# Patient Record
Sex: Male | Born: 1955 | Race: White | Hispanic: No | Marital: Married | Smoking: Current every day smoker
Health system: Southern US, Community
[De-identification: ages and names within clinical notes are randomized; demographics above are authoritative.]

## PROBLEM LIST (undated history)

## (undated) DIAGNOSIS — Z720 Tobacco use: Secondary | ICD-10-CM

## (undated) DIAGNOSIS — I10 Essential (primary) hypertension: Secondary | ICD-10-CM

## (undated) DIAGNOSIS — T24139A Burn of first degree of unspecified lower leg, initial encounter: Secondary | ICD-10-CM

## (undated) DIAGNOSIS — R0600 Dyspnea, unspecified: Secondary | ICD-10-CM

## (undated) DIAGNOSIS — F32A Depression, unspecified: Secondary | ICD-10-CM

## (undated) DIAGNOSIS — F329 Major depressive disorder, single episode, unspecified: Secondary | ICD-10-CM

## (undated) HISTORY — PX: REPLACEMENT TOTAL KNEE: SUR1224

## (undated) HISTORY — PX: BACK SURGERY: SHX140

## (undated) HISTORY — PX: COLON SURGERY: SHX602

## (undated) HISTORY — PX: JOINT REPLACEMENT: SHX530

## (undated) HISTORY — PX: CARPAL TUNNEL RELEASE: SHX101

---
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
## 2019-08-03 ENCOUNTER — Other Ambulatory Visit: Payer: Self-pay | Admitting: *Deleted

## 2019-08-03 ENCOUNTER — Inpatient Hospital Stay (HOSPITAL_COMMUNITY)
Admission: EM | Admit: 2019-08-03 | Discharge: 2019-08-16 | DRG: 231 | Disposition: A | Discharge status: Home Health | Payer: Medicare Other | Source: Other Acute Inpatient Hospital | Attending: Surgery | Admitting: Surgery

## 2019-08-03 ENCOUNTER — Encounter (HOSPITAL_COMMUNITY): Payer: Self-pay | Admitting: Medical

## 2019-08-03 ENCOUNTER — Other Ambulatory Visit: Payer: Self-pay

## 2019-08-03 ENCOUNTER — Ambulatory Visit (HOSPITAL_COMMUNITY)
Admission: AD | Admit: 2019-08-03 | Payer: Self-pay | Source: Other Acute Inpatient Hospital | Admitting: Cardiovascular Disease

## 2019-08-03 ENCOUNTER — Encounter (HOSPITAL_COMMUNITY)
Admission: EM | Disposition: A | Discharge status: Home Health | Payer: Self-pay | Source: Other Acute Inpatient Hospital | Attending: Surgery

## 2019-08-03 ENCOUNTER — Inpatient Hospital Stay (HOSPITAL_COMMUNITY): Payer: Medicare Other

## 2019-08-03 DIAGNOSIS — R001 Bradycardia, unspecified: Secondary | ICD-10-CM | POA: Diagnosis present

## 2019-08-03 DIAGNOSIS — Z20822 Contact with and (suspected) exposure to covid-19: Secondary | ICD-10-CM | POA: Diagnosis present

## 2019-08-03 DIAGNOSIS — J189 Pneumonia, unspecified organism: Secondary | ICD-10-CM | POA: Diagnosis not present

## 2019-08-03 DIAGNOSIS — K5900 Constipation, unspecified: Secondary | ICD-10-CM | POA: Diagnosis present

## 2019-08-03 DIAGNOSIS — F17200 Nicotine dependence, unspecified, uncomplicated: Secondary | ICD-10-CM | POA: Diagnosis present

## 2019-08-03 DIAGNOSIS — I2510 Atherosclerotic heart disease of native coronary artery without angina pectoris: Secondary | ICD-10-CM | POA: Diagnosis not present

## 2019-08-03 DIAGNOSIS — I214 Non-ST elevation (NSTEMI) myocardial infarction: Principal | ICD-10-CM | POA: Diagnosis present

## 2019-08-03 DIAGNOSIS — I480 Paroxysmal atrial fibrillation: Secondary | ICD-10-CM | POA: Diagnosis present

## 2019-08-03 DIAGNOSIS — D62 Acute posthemorrhagic anemia: Secondary | ICD-10-CM | POA: Diagnosis not present

## 2019-08-03 DIAGNOSIS — I110 Hypertensive heart disease with heart failure: Secondary | ICD-10-CM | POA: Diagnosis present

## 2019-08-03 DIAGNOSIS — E785 Hyperlipidemia, unspecified: Secondary | ICD-10-CM | POA: Diagnosis present

## 2019-08-03 DIAGNOSIS — F329 Major depressive disorder, single episode, unspecified: Secondary | ICD-10-CM | POA: Diagnosis present

## 2019-08-03 DIAGNOSIS — I25110 Atherosclerotic heart disease of native coronary artery with unstable angina pectoris: Secondary | ICD-10-CM

## 2019-08-03 DIAGNOSIS — Z01810 Encounter for preprocedural cardiovascular examination: Secondary | ICD-10-CM | POA: Diagnosis not present

## 2019-08-03 DIAGNOSIS — Z8673 Personal history of transient ischemic attack (TIA), and cerebral infarction without residual deficits: Secondary | ICD-10-CM

## 2019-08-03 DIAGNOSIS — Z716 Tobacco abuse counseling: Secondary | ICD-10-CM

## 2019-08-03 DIAGNOSIS — Z9861 Coronary angioplasty status: Secondary | ICD-10-CM | POA: Diagnosis not present

## 2019-08-03 DIAGNOSIS — I509 Heart failure, unspecified: Secondary | ICD-10-CM

## 2019-08-03 DIAGNOSIS — I213 ST elevation (STEMI) myocardial infarction of unspecified site: Secondary | ICD-10-CM

## 2019-08-03 DIAGNOSIS — I083 Combined rheumatic disorders of mitral, aortic and tricuspid valves: Secondary | ICD-10-CM | POA: Diagnosis present

## 2019-08-03 DIAGNOSIS — Z951 Presence of aortocoronary bypass graft: Secondary | ICD-10-CM

## 2019-08-03 DIAGNOSIS — I361 Nonrheumatic tricuspid (valve) insufficiency: Secondary | ICD-10-CM | POA: Diagnosis not present

## 2019-08-03 DIAGNOSIS — E039 Hypothyroidism, unspecified: Secondary | ICD-10-CM | POA: Diagnosis present

## 2019-08-03 DIAGNOSIS — I255 Ischemic cardiomyopathy: Secondary | ICD-10-CM | POA: Diagnosis present

## 2019-08-03 DIAGNOSIS — F17210 Nicotine dependence, cigarettes, uncomplicated: Secondary | ICD-10-CM | POA: Diagnosis present

## 2019-08-03 DIAGNOSIS — R079 Chest pain, unspecified: Secondary | ICD-10-CM

## 2019-08-03 DIAGNOSIS — R0902 Hypoxemia: Secondary | ICD-10-CM | POA: Diagnosis not present

## 2019-08-03 DIAGNOSIS — R0600 Dyspnea, unspecified: Secondary | ICD-10-CM

## 2019-08-03 DIAGNOSIS — E871 Hypo-osmolality and hyponatremia: Secondary | ICD-10-CM | POA: Diagnosis present

## 2019-08-03 DIAGNOSIS — Z01818 Encounter for other preprocedural examination: Secondary | ICD-10-CM

## 2019-08-03 DIAGNOSIS — J440 Chronic obstructive pulmonary disease with acute lower respiratory infection: Secondary | ICD-10-CM | POA: Diagnosis not present

## 2019-08-03 DIAGNOSIS — R7401 Elevation of levels of liver transaminase levels: Secondary | ICD-10-CM | POA: Diagnosis not present

## 2019-08-03 DIAGNOSIS — I340 Nonrheumatic mitral (valve) insufficiency: Secondary | ICD-10-CM

## 2019-08-03 DIAGNOSIS — Z01811 Encounter for preprocedural respiratory examination: Secondary | ICD-10-CM

## 2019-08-03 DIAGNOSIS — I5023 Acute on chronic systolic (congestive) heart failure: Secondary | ICD-10-CM | POA: Diagnosis present

## 2019-08-03 DIAGNOSIS — J9811 Atelectasis: Secondary | ICD-10-CM

## 2019-08-03 DIAGNOSIS — I10 Essential (primary) hypertension: Secondary | ICD-10-CM | POA: Diagnosis present

## 2019-08-03 DIAGNOSIS — E119 Type 2 diabetes mellitus without complications: Secondary | ICD-10-CM | POA: Diagnosis present

## 2019-08-03 HISTORY — PX: LEFT HEART CATH AND CORONARY ANGIOGRAPHY: CATH118249

## 2019-08-03 HISTORY — PX: CORONARY ANGIOPLASTY: SHX604

## 2019-08-03 HISTORY — DX: Ischemic cardiomyopathy: I25.5

## 2019-08-03 HISTORY — DX: Essential (primary) hypertension: I10

## 2019-08-03 HISTORY — DX: Atherosclerotic heart disease of native coronary artery with unstable angina pectoris: I25.110

## 2019-08-03 HISTORY — DX: Depression, unspecified: F32.A

## 2019-08-03 HISTORY — PX: CORONARY BALLOON ANGIOPLASTY: CATH118233

## 2019-08-03 HISTORY — PX: TRANSTHORACIC ECHOCARDIOGRAM: SHX275

## 2019-08-03 HISTORY — DX: Non-ST elevation (NSTEMI) myocardial infarction: I21.4

## 2019-08-03 HISTORY — DX: Tobacco use: Z72.0

## 2019-08-03 LAB — MAGNESIUM: Magnesium: 1.6 mg/dL — ABNORMAL LOW (ref 1.7–2.4)

## 2019-08-03 LAB — ECHOCARDIOGRAM COMPLETE
Height: 67.5 in
Weight: 2912 oz

## 2019-08-03 LAB — HEMOGLOBIN A1C
Hgb A1c MFr Bld: 6 % — ABNORMAL HIGH (ref 4.8–5.6)
Mean Plasma Glucose: 125.5 mg/dL

## 2019-08-03 LAB — TSH: TSH: 13.93 u[IU]/mL — ABNORMAL HIGH (ref 0.350–4.500)

## 2019-08-03 LAB — POCT ACTIVATED CLOTTING TIME: Activated Clotting Time: 489 seconds

## 2019-08-03 LAB — SARS CORONAVIRUS 2 BY RT PCR (HOSPITAL ORDER, PERFORMED IN ~~LOC~~ HOSPITAL LAB): SARS Coronavirus 2: NEGATIVE

## 2019-08-03 LAB — HIV ANTIBODY (ROUTINE TESTING W REFLEX): HIV Screen 4th Generation wRfx: NONREACTIVE

## 2019-08-03 LAB — MRSA PCR SCREENING: MRSA by PCR: NEGATIVE

## 2019-08-03 SURGERY — LEFT HEART CATH AND CORONARY ANGIOGRAPHY
Anesthesia: LOCAL

## 2019-08-03 MED ORDER — SODIUM CHLORIDE 0.9 % IV SOLN: 250.0000 mL | INTRAVENOUS | Status: DC | PRN

## 2019-08-03 MED ORDER — ONDANSETRON HCL 4 MG/2ML IJ SOLN: 4.0000 mg | Freq: Four times a day (QID) | INTRAMUSCULAR | Status: DC | PRN

## 2019-08-03 MED ORDER — HEPARIN (PORCINE) IN NACL 1000-0.9 UT/500ML-% IV SOLN
INTRAVENOUS | Status: DC | PRN
  Administered 2019-08-03 (×2): 500 mL

## 2019-08-03 MED ORDER — ASPIRIN EC 81 MG PO TBEC
81.0000 mg | DELAYED_RELEASE_TABLET | Freq: Every day | ORAL | Status: DC
  Administered 2019-08-04: 81 mg via ORAL
  Filled 2019-08-03: qty 1

## 2019-08-03 MED ORDER — NITROGLYCERIN 1 MG/10 ML FOR IR/CATH LAB
INTRA_ARTERIAL | Status: DC | PRN
  Administered 2019-08-03 (×2): 200 ug via INTRACORONARY

## 2019-08-03 MED ORDER — ZOLPIDEM TARTRATE 5 MG PO TABS: 5.0000 mg | ORAL_TABLET | Freq: Every evening | ORAL | Status: DC | PRN

## 2019-08-03 MED ORDER — HEPARIN (PORCINE) IN NACL 1000-0.9 UT/500ML-% IV SOLN
INTRAVENOUS | Status: AC
  Filled 2019-08-03: qty 1000

## 2019-08-03 MED ORDER — SODIUM CHLORIDE 0.9% FLUSH
3.0000 mL | Freq: Two times a day (BID) | INTRAVENOUS | Status: DC
  Administered 2019-08-03 – 2019-08-04 (×3): 3 mL via INTRAVENOUS

## 2019-08-03 MED ORDER — ATORVASTATIN CALCIUM 80 MG PO TABS
80.0000 mg | ORAL_TABLET | Freq: Every day | ORAL | Status: DC
  Administered 2019-08-03 – 2019-08-15 (×12): 80 mg via ORAL
  Filled 2019-08-03 (×11): qty 1

## 2019-08-03 MED ORDER — ALPRAZOLAM 0.25 MG PO TABS
0.2500 mg | ORAL_TABLET | Freq: Two times a day (BID) | ORAL | Status: DC | PRN
  Administered 2019-08-06: 0.25 mg via ORAL
  Filled 2019-08-03: qty 1

## 2019-08-03 MED ORDER — NITROGLYCERIN 0.4 MG SL SUBL
0.4000 mg | SUBLINGUAL_TABLET | SUBLINGUAL | Status: DC | PRN
  Administered 2019-08-04: 0.4 mg via SUBLINGUAL
  Filled 2019-08-03: qty 1

## 2019-08-03 MED ORDER — HEPARIN SODIUM (PORCINE) 1000 UNIT/ML IJ SOLN
INTRAMUSCULAR | Status: DC | PRN
  Administered 2019-08-03: 5000 [IU] via INTRAVENOUS

## 2019-08-03 MED ORDER — MIDAZOLAM HCL 2 MG/2ML IJ SOLN
INTRAMUSCULAR | Status: AC
  Filled 2019-08-03: qty 2

## 2019-08-03 MED ORDER — METOPROLOL TARTRATE 25 MG PO TABS: 25.0000 mg | ORAL_TABLET | Freq: Two times a day (BID) | ORAL | Status: DC

## 2019-08-03 MED ORDER — ASPIRIN 81 MG PO CHEW: 324.0000 mg | CHEWABLE_TABLET | ORAL | Status: AC

## 2019-08-03 MED ORDER — FENTANYL CITRATE (PF) 100 MCG/2ML IJ SOLN
INTRAMUSCULAR | Status: DC | PRN
  Administered 2019-08-03: 25 ug via INTRAVENOUS

## 2019-08-03 MED ORDER — HEPARIN SODIUM (PORCINE) 1000 UNIT/ML IJ SOLN
INTRAMUSCULAR | Status: DC | PRN
  Administered 2019-08-03: 4000 [IU] via INTRAVENOUS

## 2019-08-03 MED ORDER — SODIUM CHLORIDE 0.9% FLUSH
3.0000 mL | Freq: Two times a day (BID) | INTRAVENOUS | Status: DC
  Administered 2019-08-03 – 2019-08-04 (×2): 3 mL via INTRAVENOUS

## 2019-08-03 MED ORDER — ASPIRIN 300 MG RE SUPP: 300.0000 mg | RECTAL | Status: AC

## 2019-08-03 MED ORDER — NITROGLYCERIN 1 MG/10 ML FOR IR/CATH LAB
INTRA_ARTERIAL | Status: AC
  Filled 2019-08-03: qty 10

## 2019-08-03 MED ORDER — SODIUM CHLORIDE 0.9% FLUSH: 3.0000 mL | INTRAVENOUS | Status: DC | PRN

## 2019-08-03 MED ORDER — LIDOCAINE HCL (PF) 1 % IJ SOLN
INTRAMUSCULAR | Status: DC | PRN
  Administered 2019-08-03: 2 mL via SUBCUTANEOUS

## 2019-08-03 MED ORDER — MIDAZOLAM HCL 2 MG/2ML IJ SOLN
INTRAMUSCULAR | Status: DC | PRN
  Administered 2019-08-03: 1 mg via INTRAVENOUS

## 2019-08-03 MED ORDER — CHLORHEXIDINE GLUCONATE CLOTH 2 % EX PADS
6.0000 | MEDICATED_PAD | Freq: Every day | CUTANEOUS | Status: DC
  Administered 2019-08-03 – 2019-08-08 (×5): 6 via TOPICAL

## 2019-08-03 MED ORDER — VERAPAMIL HCL 2.5 MG/ML IV SOLN
INTRAVENOUS | Status: AC
  Filled 2019-08-03: qty 2

## 2019-08-03 MED ORDER — HEPARIN SODIUM (PORCINE) 1000 UNIT/ML IJ SOLN
INTRAMUSCULAR | Status: AC
  Filled 2019-08-03: qty 1

## 2019-08-03 MED ORDER — IOHEXOL 350 MG/ML SOLN
INTRAVENOUS | Status: DC | PRN
  Administered 2019-08-03: 115 mL via INTRA_ARTERIAL

## 2019-08-03 MED ORDER — ACETAMINOPHEN 325 MG PO TABS
650.0000 mg | ORAL_TABLET | ORAL | Status: DC | PRN
  Administered 2019-08-03 – 2019-08-05 (×2): 650 mg via ORAL
  Filled 2019-08-03: qty 2

## 2019-08-03 MED ORDER — ACETAMINOPHEN 325 MG PO TABS
650.0000 mg | ORAL_TABLET | ORAL | Status: DC | PRN
  Filled 2019-08-03: qty 2

## 2019-08-03 MED ORDER — VERAPAMIL HCL 2.5 MG/ML IV SOLN
INTRAVENOUS | Status: DC | PRN
  Administered 2019-08-03: 10 mL via INTRA_ARTERIAL

## 2019-08-03 MED ORDER — SODIUM CHLORIDE 0.9 % IV SOLN: INTRAVENOUS | Status: AC

## 2019-08-03 MED ORDER — FENTANYL CITRATE (PF) 100 MCG/2ML IJ SOLN
INTRAMUSCULAR | Status: AC
  Filled 2019-08-03: qty 2

## 2019-08-03 MED ORDER — HEPARIN (PORCINE) 25000 UT/250ML-% IV SOLN
1150.0000 [IU]/h | INTRAVENOUS | Status: DC
  Administered 2019-08-03: 1000 [IU]/h via INTRAVENOUS
  Administered 2019-08-04: 1100 [IU]/h via INTRAVENOUS
  Filled 2019-08-03 (×2): qty 250

## 2019-08-03 MED ORDER — LIDOCAINE HCL (PF) 1 % IJ SOLN
INTRAMUSCULAR | Status: AC
  Filled 2019-08-03: qty 30

## 2019-08-03 SURGICAL SUPPLY — 15 items
BALLN SAPPHIRE 2.0X12 (BALLOONS) ×2
BALLOON SAPPHIRE 2.0X12 (BALLOONS) ×1 IMPLANT
CATH INFINITI 5FR JK (CATHETERS) ×2 IMPLANT
CATH LAUNCHER 6FR EBU3.5 (CATHETERS) ×2 IMPLANT
DEVICE RAD COMP TR BAND LRG (VASCULAR PRODUCTS) ×2 IMPLANT
ELECT DEFIB PAD ADLT CADENCE (PAD) ×2 IMPLANT
GLIDESHEATH SLEND SS 6F .021 (SHEATH) ×2 IMPLANT
GUIDEWIRE INQWIRE 1.5J.035X260 (WIRE) ×1 IMPLANT
INQWIRE 1.5J .035X260CM (WIRE) ×2
KIT ENCORE 26 ADVANTAGE (KITS) ×2 IMPLANT
KIT HEART LEFT (KITS) ×2 IMPLANT
PACK CARDIAC CATHETERIZATION (CUSTOM PROCEDURE TRAY) ×2 IMPLANT
TRANSDUCER W/STOPCOCK (MISCELLANEOUS) ×2 IMPLANT
TUBING CIL FLEX 10 FLL-RA (TUBING) ×2 IMPLANT
WIRE RUNTHROUGH .014X180CM (WIRE) ×2 IMPLANT

## 2019-08-03 NOTE — ED Provider Notes (Signed)
[facility] EMERGENCY DEPARTMENT Provider Note   CSN: [id number] Arrival date & time: 08/03/19  1159     History No chief complaint on file.   Rodell Marrs is a 64 y.o. male.  Patient is a 64 year old male with a history of tobacco abuse but no other known medical problems but has not seen a doctor in 3 to 4 years presenting today from [facility] emergency room for a code STEMI.  Patient reports over the last 3 weeks he has had intermittent chest discomfort that he describes as a pressure heaviness and tightness in his left chest that will make him nauseated.  He states it will last sometimes 30 minutes to an hour.  He does not know what brings it on but is not always related to exertion.  He has never had symptoms like this before.  This pain started last night around 8:00 and he reports it has not gone away.  When he arrived at the emergency room at the outside hospital he was given aspirin and was also given morphine and Dilaudid which he states significantly helped his pain.  He reports now the pain is a 1 out of 10 and just a mild dull ache in the left side.  He denies any shortness of breath or nausea.  He denies any new fever, productive cough or abdominal pain.  No lower extremity swelling.  He takes no medications at this time.  Family history significant for his mom with heart disease and heart attack but no other family members.  The history is provided by the patient.       No past medical history on file.  There are no problems to display for this patient.        No family history on file.  Social History   Tobacco Use  . Smoking status: Not on file  Substance Use Topics  . Alcohol use: Not on file  . Drug use: Not on file    Home Medications Prior to Admission medications   Not on File    Allergies    Patient has no allergy information on record.  Review of Systems   Review of Systems  All other systems reviewed and are  negative.   Physical Exam Updated Vital Signs BP (!) 145/86   Pulse (!) 101   Temp 97.8 F (36.6 C) (Oral)   Resp 16   Ht 5' 7.5" (1.715 m)   Wt 82.6 kg   SpO2 96%   BMI 28.08 kg/m   Physical Exam Vitals and nursing note reviewed.  Constitutional:      General: He is not in acute distress.    Appearance: Normal appearance. He is well-developed and normal weight.  HENT:     Head: Normocephalic and atraumatic.  Eyes:     Conjunctiva/sclera: Conjunctivae normal.     Pupils: Pupils are equal, round, and reactive to light.  Cardiovascular:     Rate and Rhythm: Normal rate and regular rhythm.     Heart sounds: No murmur.  Pulmonary:     Effort: Pulmonary effort is normal. No respiratory distress.     Breath sounds: Wheezing present. No rales.     Comments: Prolonged expiratory phase with distal wheezing Abdominal:     General: There is no distension.     Palpations: Abdomen is soft.     Tenderness: There is no abdominal tenderness. There is no guarding or rebound.  Musculoskeletal:  General: No tenderness. Normal range of motion.     Cervical back: Normal range of motion and neck supple.  Skin:    General: Skin is warm and dry.     Findings: No erythema or rash.  Neurological:     General: No focal deficit present.     Mental Status: He is alert and oriented to person, place, and time. Mental status is at baseline.  Psychiatric:        Mood and Affect: Mood normal.        Behavior: Behavior normal.        Thought Content: Thought content normal.     ED Results / Procedures / Treatments   Labs (all labs ordered are listed, but only abnormal results are displayed) Labs Reviewed  SARS CORONAVIRUS 2 BY RT PCR [facility] ORDER, [place] [facility])    EKG EKG Interpretation  Date/Time:  Wednesday Aug 03 2019 12:03:36 EDT Ventricular Rate:  67 PR Interval:    QRS Duration: 84 QT Interval:  446 QTC Calculation: 471 R Axis:   52 Text  Interpretation: Sinus rhythm Nonspecific T abnormalities, lateral leads Confirmed by Blanchie Dessert [phone]) on 08/03/2019 12:15:11 PM   Radiology No results found.  Procedures Procedures (including critical care time)  Medications Ordered in ED Medications - No data to display  ED Course  I have reviewed the triage vital signs and the nursing notes.  Pertinent labs & imaging results that were available during my care of the patient were reviewed by me and considered in my medical decision making (see chart for details).    MDM Rules/Calculators/A&P                      64 year old male presenting today from outside hospital as a code STEMI.  There currently no spots in the Cath Lab and patient was brought to the emergency room.  Patient still having 1 out of 10 chest pain but vital signs are stable.  He does have some distal wheezing on expiration however he reports this is chronic for him as he smokes daily.  He is in no acute distress at this time.  Patient has already received 325 mg of aspirin and heparin bolus.  Cardiology is at bedside.  EKG with subtle ST elevation in inferior lateral leads.  Covid is pending.  Cardiology to admit.  MDM Number of Diagnoses or Management Options   Amount and/or Complexity of Data Reviewed Tests in the medicine section of CPT: ordered and reviewed Decide to obtain previous medical records or to obtain history from someone other than the patient: yes Obtain history from someone other than the patient: yes Review and summarize past medical records: yes Discuss the patient with other providers: yes Independent visualization of images, tracings, or specimens: yes  Risk of Complications, Morbidity, and/or Mortality Presenting problems: high Diagnostic procedures: low Management options: low  Patient Progress Patient progress: stable  Final Clinical Impression(s) / ED Diagnoses Final diagnoses:  ST elevation myocardial infarction  (STEMI), unspecified artery [facility])    Rx / DC Orders ED Discharge Orders    None       Blanchie Dessert, MD 08/03/19 1216

## 2019-08-03 NOTE — Consult Note (Signed)
[address]       [place] [address]             [phone]      Cardiothoracic Surgery Consultation   Reason for Consult: Severe multi-vessel coronary artery disease Referring Physician: Dr. Jerilynn Mages. Matthew [name] is an 64 y.o. male.  HPI:  Patient is a 64 year old history of hypertension, hyperlipidemia, and ongoing heavy tobacco abuse who reports a 3-week history of frequent intermittent substernal chest pain that felt like an elephant sitting on his chest and lasted from 15 to 30 minutes.  These episodes have become more frequent and last night he developed prolonged chest pain that was 10 out of 10 and did not resolve.  He presented to [facility] emergency room and electrocardiogram showed minimal lateral ST elevation.  His troponin was elevated at 5.98.  He had continued chest pain was transferred to [facility] for cardiac catheterization which was performed today.  This showed severe three-vessel coronary disease.  The culprit lesion was an occluded large first marginal branch which was successfully opened with balloon angioplasty.  No stent was placed since the patient had severe three-vessel coronary disease and surgery was felt to be the best treatment.  There is also a 90% ostial to proximal LAD stenosis.  There is a large first diagonal and 80% stenosis.  The right coronary artery was diffusely diseased with segmental 60% mid to distal vessel stenosis.  Left ventricular ejection fraction of 35 to 45% by visual estimate with a moderately elevated LVEDP of 21.  A 2D echocardiogram today showed an ejection fraction of 35 to 40% with global hypokinesis.  The left ventricle is mildly dilated.  There is grade 1 diastolic dysfunction.  RV function is normal.  There was mild mitral regurgitation and mild aortic insufficiency.  The patient is on disability due to multiple orthopedic surgeries in the past.  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  He does report that over the last 3 weeks he has had intermittent chest discomfort associated with nausea, diaphoresis, and generalized weakness and shortness of breath.  Past Medical History:  Diagnosis Date  . Depression   . Hyperlipidemia   . Hypertension   . Tobacco use     Past Surgical History:  Procedure Laterality Date  . BACK SURGERY    . REPLACEMENT TOTAL KNEE      Family History  Problem Relation Age of Onset  . Diabetes Mother   . Cancer Father     Social History:  reports that he has been smoking cigarettes. He has a 46.00 pack-year smoking history. He has never used smokeless tobacco. He reports that he does not use drugs. No history on file for alcohol.  Allergies: No Known Allergies  Medications:  I have reviewed the patient's current medications. Prior to Admission:  No medications prior to admission.   Scheduled: . aspirin  324 mg Oral NOW   Or  . aspirin  300 mg Rectal NOW  . [START ON 08/04/2019] aspirin EC  81 mg Oral Daily  . atorvastatin  80 mg Oral q1800  . Chlorhexidine Gluconate Cloth  6 each Topical Daily  . sodium chloride flush  3 mL Intravenous Q12H  . sodium chloride flush  3 mL Intravenous Q12H   Continuous: . sodium chloride    . sodium chloride    . heparin 1,000 Units/hr (08/03/19 1900)  ZWC:HENIDP chloride, sodium chloride, acetaminophen, acetaminophen, ALPRAZolam, nitroGLYCERIN, ondansetron (ZOFRAN) IV, ondansetron (ZOFRAN) IV, sodium chloride flush, sodium chloride flush, zolpidem  Results for orders placed or performed during the hospital encounter of 08/03/19 (from the past 48 hour(s))  SARS Coronavirus 2 by RT PCR (hospital order, performed in [facility] hospital lab) Nasopharyngeal Nasopharyngeal Swab     Status: None   Collection Time: 08/03/19 12:09 PM   Specimen: Nasopharyngeal Swab  Result Value Ref Range   SARS Coronavirus 2 NEGATIVE NEGATIVE    Comment: (NOTE) SARS-CoV-2 target nucleic acids  are NOT DETECTED. The SARS-CoV-2 RNA is generally detectable in upper and lower respiratory specimens during the acute phase of infection. The lowest concentration of SARS-CoV-2 viral copies this assay can detect is 250 copies / mL. A negative result does not preclude SARS-CoV-2 infection and should not be used as the sole basis for treatment or other patient management decisions.  A negative result may occur with improper specimen collection / handling, submission of specimen other than nasopharyngeal swab, presence of viral mutation(s) within the areas targeted by this assay, and inadequate number of viral copies (<250 copies / mL). A negative result must be combined with clinical observations, patient history, and epidemiological information. Fact Sheet for Patients:   StrictlyIdeas.no Fact Sheet for Healthcare Providers: BankingDealers.co.za This test is not yet approved or cleared  by the [place] FDA and has been authorized for detection and/or diagnosis of SARS-CoV-2 by FDA under an Emergency Use Authorization (EUA).  This EUA will remain in effect (meaning this test can be used) for the duration of the COVID-19 declaration under Section 564(b)(1) of the Act, 21 U.S.C. section 360bbb-3(b)(1), unless the authorization is terminated or revoked sooner. Performed at [facility], [place] [address] [place], [place] [address]   POCT Activated clotting time     Status: None   Collection Time: 08/03/19  1:16 PM  Result Value Ref Range   Activated Clotting Time 489 seconds  MRSA PCR Screening     Status: None   Collection Time: 08/03/19  2:29 PM   Specimen: Nasopharyngeal  Result Value Ref Range   MRSA by PCR NEGATIVE NEGATIVE    Comment:        The GeneXpert MRSA Assay (FDA approved for NASAL specimens only), is one component of a comprehensive MRSA colonization surveillance program. It is not intended to diagnose  MRSA infection nor to guide or monitor treatment for MRSA infections. Performed at [facility], [place] [address], [place], [place] [address]   Hemoglobin A1c     Status: Abnormal   Collection Time: 08/03/19  2:53 PM  Result Value Ref Range   Hgb A1c MFr Bld 6.0 (H) 4.8 - 5.6 %    Comment: (NOTE) Pre diabetes:          5.7%-6.4% Diabetes:              >6.4% Glycemic control for   <7.0% adults with diabetes    Mean Plasma Glucose 125.5 mg/dL    Comment: Performed at [facility] [address] [place], [place] [address]  HIV Antibody (routine testing w rflx)     Status: None   Collection Time: 08/03/19  2:53 PM  Result Value Ref Range   HIV Screen 4th Generation wRfx Non Reactive Non Reactive    Comment: Performed at [facility], [place] [address] [place], [place] [address]  TSH     Status: Abnormal  Collection Time: 08/03/19  2:53 PM  Result Value Ref Range   TSH 13.930 (H) 0.350 - 4.500 uIU/mL    Comment: Performed by a 3rd Generation assay with a functional sensitivity of <=0.01 uIU/mL. Performed at [facility], [place] [address] [place], [place] [address]   Magnesium     Status: Abnormal   Collection Time: 08/03/19  2:53 PM  Result Value Ref Range   Magnesium 1.6 (L) 1.7 - 2.4 mg/dL    Comment: Performed at [facility] [address] [place], [place] [address]    CARDIAC CATHETERIZATION  Result Date: 08/03/2019  Mid RCA to Dist RCA lesion is 60% stenosed.  1st Mrg lesion is 100% stenosed.  Post intervention, there is a 25% residual stenosis.  Balloon angioplasty was performed using a BALLOON SAPPHIRE 2.0X12.  Ost LAD to Prox LAD lesion is 90% stenosed.  1st Diag lesion is 80% stenosed.  There is moderate left ventricular systolic dysfunction.  The left ventricular ejection fraction is 35-45% by visual estimate.  LV end diastolic pressure is moderately elevated.  1.  Significant three-vessel coronary artery disease.  The culprit for  myocardial infarction is an occluded OM1.  There is complex bifurcation proximal LAD/diagonal disease with heavy calcifications and extension all the way back to the ostial LAD.  There is also 60% diffuse disease in the mid to distal right coronary artery. 2.  Moderately reduced LV systolic function with an EF of 35% with global hypokinesis but more pronounced in the inferior apical area.  Moderately elevated left ventricular end-diastolic pressure at 22 mmHg. 3.  Successful balloon angioplasty of OM1 which establish TIMI-3 flow. Recommendations: I elected to treat the culprit OM1 occlusion in order to establish flow.  However, I did not place a stent to avoid the need for dual antiplatelet therapy.  The patient has complex calcified proximal LAD disease at the bifurcation of a large diagonal branch which is also disease.  This is best treated with CABG.  In addition, he has borderline diffuse disease in the mid to distal right coronary artery. Recommend starting heparin drip 4 hours after sheath pull. I consulted CVTS for CABG. the patient was not given a P2 Y 12 inhibitor. An echocardiogram was ordered.   ECHOCARDIOGRAM COMPLETE  Result Date: 08/03/2019    ECHOCARDIOGRAM REPORT   Patient Name:   Matthew [name] Date of Exam: 08/03/2019 Medical Rec #:  [id number]     Height:       67.5 in Accession #:    [id number]    Weight:       182.0 lb Date of Birth:  December 11, 1955     BSA:          1.954 m Patient Age:    70 years      BP:           127/84 mmHg Patient Gender: M             HR:           59 bpm. Exam Location:  Inpatient Procedure: 2D Echo Indications:    chest pain  History:        Patient has no prior history of Echocardiogram examinations.                 CAD; Risk Factors:Current Smoker.  Sonographer:    Johny Chess Referring Phys: [id number] [place]  1. Left ventricular ejection fraction, by estimation, is 35 to 40%. The  left ventricle has moderately decreased function. The left  ventricle demonstrates global hypokinesis. The left ventricular internal cavity size was mildly dilated. Left ventricular diastolic parameters are consistent with Grade I diastolic dysfunction (impaired relaxation). Elevated left atrial pressure.  2. Right ventricular systolic function is normal. The right ventricular size is normal. There is mildly elevated pulmonary artery systolic pressure. The estimated right ventricular systolic pressure is 65.5 mmHg.  3. Left atrial size was mildly dilated.  4. The mitral valve is normal in structure. Mild mitral valve regurgitation. No evidence of mitral stenosis.  5. The aortic valve is normal in structure. Aortic valve regurgitation is mild. No aortic stenosis is present.  6. The inferior vena cava is normal in size with greater than 50% respiratory variability, suggesting right atrial pressure of 3 mmHg. FINDINGS  Left Ventricle: Left ventricular ejection fraction, by estimation, is 35 to 40%. The left ventricle has moderately decreased function. The left ventricle demonstrates global hypokinesis. The left ventricular internal cavity size was mildly dilated. There is no left ventricular hypertrophy. Left ventricular diastolic parameters are consistent with Grade I diastolic dysfunction (impaired relaxation). Elevated left atrial pressure.  LV Wall Scoring: The mid and distal lateral wall and basal anteroseptal segment are hypokinetic. Right Ventricle: The right ventricular size is normal. No increase in right ventricular wall thickness. Right ventricular systolic function is normal. There is mildly elevated pulmonary artery systolic pressure. The tricuspid regurgitant velocity is 2.99  m/s, and with an assumed right atrial pressure of 3 mmHg, the estimated right ventricular systolic pressure is 37.4 mmHg. Left Atrium: Left atrial size was mildly dilated. Right Atrium: Right atrial size was normal in size. Pericardium: There is no evidence of pericardial effusion. Mitral  Valve: The mitral valve is normal in structure. Normal mobility of the mitral valve leaflets. Mild mitral valve regurgitation. No evidence of mitral valve stenosis. Tricuspid Valve: The tricuspid valve is normal in structure. Tricuspid valve regurgitation is mild . No evidence of tricuspid stenosis. Aortic Valve: The aortic valve is normal in structure. Aortic valve regurgitation is mild. Aortic regurgitation PHT measures 560 msec. No aortic stenosis is present. Pulmonic Valve: The pulmonic valve was normal in structure. Pulmonic valve regurgitation is not visualized. No evidence of pulmonic stenosis. Aorta: The aortic root is normal in size and structure. Venous: The inferior vena cava is normal in size with greater than 50% respiratory variability, suggesting right atrial pressure of 3 mmHg. IAS/Shunts: No atrial level shunt detected by color flow Doppler.  LEFT VENTRICLE PLAX 2D LVIDd:         5.60 cm  Diastology LVIDs:         4.40 cm  LV e' lateral:   9.14 cm/s LV PW:         0.80 cm  LV E/e' lateral: 8.8 LV IVS:        0.80 cm  LV e' medial:    6.96 cm/s LVOT diam:     2.00 cm  LV E/e' medial:  11.5 LV SV:         74 LV SV Index:   38 LVOT Area:     3.14 cm  RIGHT VENTRICLE RV S prime:     13.50 cm/s TAPSE (M-mode): 2.0 cm LEFT ATRIUM             Index       RIGHT ATRIUM           Index LA diam:  4.20 cm 2.15 cm/m  RA Area:     16.00 cm LA Vol (A2C):   61.1 ml 31.28 ml/m RA Volume:   41.20 ml  21.09 ml/m LA Vol (A4C):   80.4 ml 41.16 ml/m LA Biplane Vol: 76.0 ml 38.90 ml/m  AORTIC VALVE LVOT Vmax:   101.00 cm/s LVOT Vmean:  63.300 cm/s LVOT VTI:    0.236 m AI PHT:      560 msec  AORTA Ao Root diam: 3.10 cm MITRAL VALVE               TRICUSPID VALVE MV Area (PHT): 2.39 cm    TR Peak grad:   35.8 mmHg MV Decel Time: 317 msec    TR Vmax:        299.00 cm/s MV E velocity: 80.10 cm/s MV A velocity: 81.80 cm/s  SHUNTS MV E/A ratio:  0.98        Systemic VTI:  0.24 m                            Systemic  Diam: 2.00 cm Ena Dawley MD Electronically signed by Ena Dawley MD Signature Date/Time: 08/03/2019/7:03:47 PM    Final     Review of Systems  Constitutional: Positive for activity change, diaphoresis and fatigue.  HENT: Negative.   Eyes: Negative.   Respiratory: Positive for chest tightness and shortness of breath.   Cardiovascular: Positive for chest pain. Negative for palpitations and leg swelling.  Gastrointestinal: Positive for nausea.  Endocrine: Negative.   Genitourinary: Negative.   Musculoskeletal: Positive for back pain and neck pain.  Skin: Negative.   Allergic/Immunologic: Negative.   Neurological: Negative for dizziness.  Hematological: Negative.   Psychiatric/Behavioral: Negative.    Blood pressure 130/69, pulse (!) 58, temperature 97.7 F (36.5 C), temperature source Oral, resp. rate 13, height 5' 7.5" (1.715 m), weight 82.6 kg, SpO2 98 %. Physical Exam  Constitutional: He appears well-developed and well-nourished. No distress.  HENT:  Head: Normocephalic and atraumatic.  Eyes: Pupils are equal, round, and reactive to light. Conjunctivae and EOM are normal.  Neck: No JVD present.  Cardiovascular: Normal rate, regular rhythm and normal heart sounds.  No murmur heard. Respiratory: Effort normal. No respiratory distress. He has wheezes.  GI: Soft. Bowel sounds are normal. He exhibits no distension. There is no abdominal tenderness.  Musculoskeletal:        General: No edema. Normal range of motion.     Cervical back: Normal range of motion and neck supple.     Comments: Varicose veins in both lower extremities.  Scar over right knee from total knee replacement.  Scarring from partial thickness skin graft in the right lower leg.  Lymphadenopathy:    He has no cervical adenopathy.  Skin: Skin is warm and dry.  Psychiatric: He has a normal mood and affect. His behavior is normal.     ECHOCARDIOGRAM REPORT       Patient Name:  Matthew [name] Date of  Exam: 08/03/2019  Medical Rec #: [id number]   Height:    67.5 in  Accession #:  [id number]  Weight:    182.0 lb  Date of Birth: [birth year]   BSA:     1.954 m  Patient Age:  55 years   BP:      127/84 mmHg  Patient Gender: M       HR:      59 bpm.  Exam Location:  Inpatient   Procedure: 2D Echo   Indications:  chest pain    History:    Patient has no prior history of Echocardiogram  examinations.         CAD; Risk Factors:Current Smoker.    Sonographer:  Johny Chess  Referring Phys: [id number] [place]    1. Left ventricular ejection fraction, by estimation, is 35 to 40%. The  left ventricle has moderately decreased function. The left ventricle  demonstrates global hypokinesis. The left ventricular internal cavity size  was mildly dilated. Left ventricular  diastolic parameters are consistent with Grade I diastolic dysfunction  (impaired relaxation). Elevated left atrial pressure.  2. Right ventricular systolic function is normal. The right ventricular  size is normal. There is mildly elevated pulmonary artery systolic  pressure. The estimated right ventricular systolic pressure is 46.5 mmHg.  3. Left atrial size was mildly dilated.  4. The mitral valve is normal in structure. Mild mitral valve  regurgitation. No evidence of mitral stenosis.  5. The aortic valve is normal in structure. Aortic valve regurgitation is  mild. No aortic stenosis is present.  6. The inferior vena cava is normal in size with greater than 50%  respiratory variability, suggesting right atrial pressure of 3 mmHg.   FINDINGS  Left Ventricle: Left ventricular ejection fraction, by estimation, is 35  to 40%. The left ventricle has moderately decreased function. The left  ventricle demonstrates global hypokinesis. The left ventricular internal  cavity size was mildly dilated.  There is no left ventricular  hypertrophy. Left ventricular diastolic  parameters are consistent with Grade I diastolic dysfunction (impaired  relaxation). Elevated left atrial pressure.     LV Wall Scoring:  The mid and distal lateral wall and basal anteroseptal segment are  hypokinetic.   Right Ventricle: The right ventricular size is normal. No increase in  right ventricular wall thickness. Right ventricular systolic function is  normal. There is mildly elevated pulmonary artery systolic pressure. The  tricuspid regurgitant velocity is 2.99  m/s, and with an assumed right atrial pressure of 3 mmHg, the estimated  right ventricular systolic pressure is 68.1 mmHg.   Left Atrium: Left atrial size was mildly dilated.   Right Atrium: Right atrial size was normal in size.   Pericardium: There is no evidence of pericardial effusion.   Mitral Valve: The mitral valve is normal in structure. Normal mobility of  the mitral valve leaflets. Mild mitral valve regurgitation. No evidence of  mitral valve stenosis.   Tricuspid Valve: The tricuspid valve is normal in structure. Tricuspid  valve regurgitation is mild . No evidence of tricuspid stenosis.   Aortic Valve: The aortic valve is normal in structure. Aortic valve  regurgitation is mild. Aortic regurgitation PHT measures 560 msec. No  aortic stenosis is present.   Pulmonic Valve: The pulmonic valve was normal in structure. Pulmonic valve  regurgitation is not visualized. No evidence of pulmonic stenosis.   Aorta: The aortic root is normal in size and structure.   Venous: The inferior vena cava is normal in size with greater than 50%  respiratory variability, suggesting right atrial pressure of 3 mmHg.   IAS/Shunts: No atrial level shunt detected by color flow Doppler.     LEFT VENTRICLE  PLAX 2D  LVIDd:     5.60 cm Diastology  LVIDs:     4.40 cm LV e' lateral:  9.14 cm/s  LV PW:     0.80 cm LV E/e' lateral: 8.8  LV IVS:    0.80 cm  LV e' medial:  6.96 cm/s  LVOT diam:   2.00 cm LV E/e' medial: 11.5  LV SV:     74  LV SV Index:  38  LVOT Area:   3.14 cm     RIGHT VENTRICLE  RV S prime:   13.50 cm/s  TAPSE (M-mode): 2.0 cm   LEFT ATRIUM       Index    RIGHT ATRIUM      Index  LA diam:    4.20 cm 2.15 cm/m RA Area:   16.00 cm  LA Vol (A2C):  61.1 ml 31.28 ml/m RA Volume:  41.20 ml 21.09 ml/m  LA Vol (A4C):  80.4 ml 41.16 ml/m  LA Biplane Vol: 76.0 ml 38.90 ml/m  AORTIC VALVE  LVOT Vmax:  101.00 cm/s  LVOT Vmean: 63.300 cm/s  LVOT VTI:  0.236 m  AI PHT:   560 msec    AORTA  Ao Root diam: 3.10 cm   MITRAL VALVE        TRICUSPID VALVE  MV Area (PHT): 2.39 cm  TR Peak grad:  35.8 mmHg  MV Decel Time: 317 msec  TR Vmax:    299.00 cm/s  MV E velocity: 80.10 cm/s  MV A velocity: 81.80 cm/s SHUNTS  MV E/A ratio: 0.98    Systemic VTI: 0.24 m               Systemic Diam: 2.00 cm   Ena Dawley MD  Electronically signed by Ena Dawley MD  Signature Date/Time: 08/03/2019/7:03:47 PM     Physicians Panel Physicians Referring Physician Case Authorizing Physician  Wellington Hampshire, MD (Primary)       Procedures CORONARY BALLOON ANGIOPLASTY  LEFT HEART CATH AND CORONARY ANGIOGRAPHY     Conclusion Mid RCA to Dist RCA lesion is 60% stenosed.  1st Mrg lesion is 100% stenosed.  Post intervention, there is a 25% residual stenosis.  Balloon angioplasty was performed using a BALLOON SAPPHIRE 2.0X12.  Ost LAD to Prox LAD lesion is 90% stenosed.  1st Diag lesion is 80% stenosed.  There is moderate left ventricular systolic dysfunction.  The left ventricular ejection fraction is 35-45% by visual estimate.  LV end diastolic pressure is moderately elevated. 1. Significant three-vessel coronary artery disease. The culprit for myocardial infarction is an occluded OM1. There is complex bifurcation proximal LAD/diagonal  disease with heavy calcifications and extension all the way back to the ostial LAD. There is also 60% diffuse disease in the mid to distal right coronary artery.  2. Moderately reduced LV systolic function with an EF of 35% with global hypokinesis but more pronounced in the inferior apical area. Moderately elevated left ventricular end-diastolic pressure at 22 mmHg.  3. Successful balloon angioplasty of OM1 which establish TIMI-3 flow.  Recommendations:  I elected to treat the culprit OM1 occlusion in order to establish flow. However, I did not place a stent to avoid the need for dual antiplatelet therapy. The patient has complex calcified proximal LAD disease at the bifurcation of a large diagonal branch which is also disease. This is best treated with CABG. In addition, he has borderline diffuse disease in the mid to distal right coronary artery.  Recommend starting heparin drip 4 hours after sheath pull.  I consulted CVTS for CABG. the patient was not given a P2 Y 12 inhibitor.  An echocardiogram was ordered.            Indications  Non-ST elevation (NSTEMI) myocardial infarction ([place]) [I21.4 (ICD-10-CM)]     Procedural Details Technical Details Procedural Details: The right wrist was prepped, draped, and anesthetized with 1% lidocaine. Using the modified Seldinger technique, a 5 French Slender sheath was introduced into the right radial artery.3 mg of verapamil was administered through the sheath, weight-based unfractionated heparin was administered intravenously. A Jackie catheter was used for selective coronary angiography and left ventriculography. There were no immediate procedural complications.  PCI Note: Following the diagnostic procedure, the decision was made to proceed with PCI. Additional unfractionated heparin was given. See PCI details. I elected to perform balloon angioplasty in the occluded OM1 to establish TIMI-3 flow with no intention of placing a stent to avoid the need for  dual antiplatelet therapy given that the patient will require surgical revascularization of his complex LAD diagonal disease.The patient tolerated the procedure well. There were no immediate procedural complications. A TR band was used for radial hemostasis. The patient was transferred to the post catheterization recovery area for further monitoring.  Estimated blood loss <50 mL.   During this procedure medications were administered to achieve and maintain moderate conscious sedation while the patient's heart rate, blood pressure, and oxygen saturation were continuously monitored and I was present face-to-face 100% of this time.     Medications (Filter: Administrations occurring from 08/03/19 1233 to 08/03/19 1345)  Continuous medications are totaled by the amount administered until 08/03/19 1345.  lidocaine (PF) (XYLOCAINE) 1 % injection (mL) Total volume: 2 mL  Date/Time   Rate/Dose/Volume Action  08/03/19 1253  2 mL Given  Radial Cocktail/Verapamil only (mL) Total volume: 10 mL  Date/Time   Rate/Dose/Volume Action  08/03/19 1254  10 mL Given  midazolam (VERSED) injection (mg) Total dose: 1 mg  Date/Time   Rate/Dose/Volume Action  08/03/19 1251  1 mg Given  fentaNYL (SUBLIMAZE) injection (mcg) Total dose: 25 mcg  Date/Time   Rate/Dose/Volume Action  08/03/19 1251  25 mcg Given  heparin sodium (porcine) injection (Units) Total dose: 5,000 Units  Date/Time   Rate/Dose/Volume Action  08/03/19 1307  5,000 Units Given  nitroGLYCERIN 1 mg/10 mL (100 mcg/mL) - IR/CATH LAB (mcg) Total dose: 400 mcg  Date/Time   Rate/Dose/Volume Action  08/03/19 1316  200 mcg Given  1320  200 mcg Given  Heparin (Porcine) in NaCl 1000-0.9 UT/500ML-% SOLN (mL) Total volume: 1,000 mL  Date/Time   Rate/Dose/Volume Action  08/03/19 1317  500 mL Given  1317  500 mL Given  iohexol (OMNIPAQUE) 350 MG/ML injection (mL) Total volume: 115 mL  Date/Time   Rate/Dose/Volume Action  08/03/19 1334  115 mL  Given  heparin sodium (porcine) injection (Units) Total dose: 4,000 Units  Date/Time   Rate/Dose/Volume Action  08/03/19 1256  4,000 Units Given     Sedation Time Sedation Time Physician-1: 36 minutes 52 seconds        Contrast Medication Name Total Dose  iohexol (OMNIPAQUE) 350 MG/ML injection 115 mL     Radiation/Fluoro Fluoro time: 6.3 (min)  DAP: 63845 (mGycm2)  Cumulative Air Kerma: 364 (mGy)        Coronary Findings Diagnostic Dominance: Right  Left Main  Vessel is angiographically normal.   Left Anterior Descending  Ost LAD to Prox LAD lesion 90% stenosed  Ost LAD to Prox LAD lesion is 90% stenosed. Vessel is not the culprit lesion. The lesion is type C. The lesion is severely calcified.   First Diagonal Branch  Vessel is large in size.  1st Diag  lesion 80% stenosed  1st Diag lesion is 80% stenosed.   Third Diagonal Branch  Vessel is small in size.   Left Circumflex  The vessel exhibits minimal luminal irregularities.   First Obtuse Marginal Branch  Vessel is large in size.  1st Mrg lesion 100% stenosed  1st Mrg lesion is 100% stenosed. Vessel is the culprit lesion. The lesion is not complex (non high-C). The lesion was not previously treated.   Second Obtuse Marginal Branch  Vessel is small in size.   Right Coronary Artery  Mid RCA to Dist RCA lesion 60% stenosed  Mid RCA to Dist RCA lesion is 60% stenosed.   Right Posterior Descending Artery  There is mild disease in the vessel.  Intervention 1st Mrg lesion  Angioplasty  Lesion length: 15 mm. CATH LAUNCHER 6FR EBU3.5 guide catheter was inserted. WIRE RUNTHROUGH .[id number] guidewire used to cross lesion. Balloon angioplasty was performed using a BALLOON SAPPHIRE 2.0X12. Maximum pressure: 10 atm. Inflation time: 90 sec.  Post-Intervention Lesion Assessment  The intervention was successful. Pre-interventional TIMI flow is 0. Post-intervention TIMI flow is 3. No complications occurred at this  lesion.  There is a 25% residual stenosis post intervention.                       Wall Motion Resting               All segments of the heart are hypokinetic.               Left Heart Left Ventricle The left ventricle is mildly dilated. There is moderate left ventricular systolic dysfunction. LV end diastolic pressure is moderately elevated. The left ventricular ejection fraction is 35-45% by visual estimate. There are LV function abnormalities due to segmental dysfunction.     Coronary Diagrams Diagnostic Dominance: Right  &&&&&  Intervention &&&&&       Implants  No implant documentation for this case.      Syngo Images Link to Procedure Log  Show images for CARDIAC CATHETERIZATION Procedure Log     Images on Long Term Storage   Show images for [place], [place]         [facility] Data   Most Recent Value  AO Systolic Pressure 350 mmHg  AO Diastolic Pressure 70 mmHg  AO Mean 94 mmHg  LV Systolic Pressure 093 mmHg  LV Diastolic Pressure 14 mmHg  LV EDP 22 mmHg  AOp Systolic Pressure 818 mmHg  AOp Diastolic Pressure 71 mmHg  AOp Mean Pressure 98 mmHg  LVp Systolic Pressure 299 mmHg  LVp Diastolic Pressure 16 mmHg  LVp EDP Pressure 21 mmHg    Assessment/Plan:  This 64 year old gentleman has severe three-vessel coronary disease and moderate left ventricular systolic ith PTCA although it was likely closed for an extended period since his continuous pain started last night and his catheterization was done earlier this afternoon.  I agree that coronary bypass graft surgery is the best long-term treatment for this patient with severe calcific three-vessel coronary disease with high-grade proximal LAD and diagonal stenosis. I discussed the operative procedure with the patient including alternatives, benefits and risks; including but not limited to bleeding, blood transfusion, infection, stroke, myocardial infarction, graft failure, heart block requiring a  permanent pacemaker, organ dysfunction, and death.  Tsosie Billing understands and agrees to proceed.  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  I would be hesitant to use bilateral  internal mammary artery grafts in this ongoing heavy smoker unless absolutely necessary.  His preoperative vascular exam is pending.  I will tentatively plan to proceed with surgery on Friday morning.  I spent 40 minutes performing this consultation and > 50% of this time was spent face to face counseling and coordinating the care of this patient's severe three-vessel coronary disease.  Gaye Pollack 08/03/2019

## 2019-08-03 NOTE — Progress Notes (Signed)
  Echocardiogram 2D Echocardiogram has been performed.  Delcie Roch 08/03/2019, 4:31 PM

## 2019-08-03 NOTE — H&P (Addendum)
Cardiology Admission History and Physical:   [name] ID: Matthew [name] MRN: [id number]; DOB: [birth year]   Admission date: 08/03/2019  Primary Care Provider: Patient, No Pcp Per Primary Cardiologist: New Primary Electrophysiologist:  None   Chief Complaint: Chest pain  [name] Profile:   Matthew [name] is a 64 y.o. male with pmh of HTN, HLD and heavy tobacco use who presents from [facility] for chest pain.   History of Present Illness:   Matthew [name] has no previous history of MI, stent, stroke, DM, cancer. He has history of hypertension and hyperlipidemia but has not been on medications in 4-5 years. Matthew last time he saw his PCP was about 4 years ago. Family history negative for cardiac history. He is a heavy smoker, 1ppd since he was 60. He is on disability for multiple orthopedic issues.   He presented to Matthew ED at [facility] for chest pain. Matthew symptoms started 3 weeks ago and have been intermittent since then. Exertion did not seem to make Matthew pain worse. Pain is substernal and pressure-like, "like an elephant on my chest". Sometimes radiating down Matthew arms. Had associated sob, nausea when Matthew pain was severe, and diaphoresis. Last night Matthew [name] had another episode that did not go away, pain was severe as 10/10 at that time. This morning he woke up with Matthew same and came into Matthew ED. No recent fever, chills, illness, lower leg edema, orthopnea.   In Matthew ED at [facility] BP 167/87, RR 20, pulse 68, 99%O2. Labs showed creatinine 0.90, AST 142, ALT 30, Alk phos 108. Pro BNP 634, WBC 9.3, Hgb 15.8. troponin 5.98. CXR was unremarkable. In Matthew ED he was given ASA and SL nitro which seemed to improve Matthew chest pain. Also received zofran, morphine 34m IV, metoprolol 2.554mIV, pepcid 2028mV, dilaudid 1mg67m. EKG showed. EKG showed sinus 64bpm with minimal STE in lateral leads with TWI. [name] was seen by Dr. RevaGeraldo Pitter [facility]. Question whether  this was a STEMI but EKG was reviewed by Dr. AridFletcher Anon decided this was NSTEMI.   Past Medical History:  Diagnosis Date  . Depression   . Hyperlipidemia   . Hypertension   . Tobacco use     Past Surgical History:  Procedure Laterality Date  . BACK SURGERY    . REPLACEMENT TOTAL KNEE       Medications Prior to Admission: Prior to Admission medications   Not on File     Allergies:   Not on File  Social History:   Social History   Socioeconomic History  . Marital status: Married    Spouse name: Not on file  . Number of children: Not on file  . Years of education: Not on file  . Highest education level: Not on file  Occupational History  . Occupation: unemployed  Tobacco Use  . Smoking status: Current Every Day Smoker    Packs/day: 1.00    Years: 46.00    Pack years: 46.00    Types: Cigarettes  . Smokeless tobacco: Never Used  Substance and Sexual Activity  . Alcohol use: Not on file  . Drug use: Never  . Sexual activity: Not on file  Other Topics Concern  . Not on file  Social History Narrative  . Not on file   Social Determinants of Health   Financial Resource Strain:   . Difficulty of Paying Living Expenses:   Food Insecurity:   . Worried About Running  Out of Food in Matthew Last Year:   . Pleas[place] in Matthew Last Year:   Transportation Needs:   . Lack of Transportation (Medical):   Marland Kitchen Lack of Transportation (Non-Medical):   Physical Activity:   . Days of Exercise per Week:   . Minutes of Exercise per Session:   Stress:   . Feeling of Stress :   Social Connections:   . Frequency of Communication with Friends and Family:   . Frequency of Social Gatherings with Friends and Family:   . Attends Religious Services:   . Active Member of Clubs or Organizations:   . Attends Archivist Meetings:   Marland Kitchen Marital Status:   Intimate Partner Violence:   . Fear of Current or Ex-Partner:   . Emotionally Abused:   Marland Kitchen Physically Abused:   . Sexually  Abused:     Family History:   Matthew [name]'s family history includes Cancer in his father; Diabetes in his mother.    ROS:  Please see Matthew history of present illness.  All other ROS reviewed and negative.     Physical Exam/Data:   Vitals:   08/03/19 1204 08/03/19 1207 08/03/19 1215  BP: (!) 145/86  (!) 145/83  Pulse: (!) 101  (!) 52  Resp: 16  11  Temp: 97.8 F (36.6 C)    TempSrc: Oral    SpO2: 94% 96% 98%  Weight:  82.6 kg   Height:  5' 7.5" (1.715 m)    No intake or output data in Matthew 24 hours ending 08/03/19 1307 Last 3 Weights 08/03/2019  Weight (lbs) 182 lb  Weight (kg) 82.555 kg     Body mass index is 28.08 kg/m.  General:  Well nourished, well developed, in no acute distress HEENT: normal Lymph: no adenopathy Neck: no JVD Endocrine:  No thryomegaly Vascular: No carotid bruits; FA pulses 2+ bilaterally without bruits  Cardiac:  normal S1, S2; RRR; no murmur  Lungs:  +wheezing, no rhonchi or rales  Abd: soft, nontender, no hepatomegaly  Ext: no edema Musculoskeletal:  No deformities, BUE and BLE strength normal and equal Skin: warm and dry  Neuro:  CNs 2-12 intact, no focal abnormalities noted Psych:  Normal affect    EKG:  Matthew ECG that was done 08/03/19 was personally reviewed and demonstrates sinus with minimal STE lateral leads, TWI  Relevant CV Studies:  Cardiac cath  Laboratory Data:  High Sensitivity Troponin:  No results for input(s): TROPONINIHS in Matthew last 720 hours.    ChemistryNo results for input(s): NA, K, CL, CO2, GLUCOSE, BUN, CREATININE, CALCIUM, GFRNONAA, GFRAA, ANIONGAP in Matthew last 168 hours.  No results for input(s): PROT, ALBUMIN, AST, ALT, ALKPHOS, BILITOT in Matthew last 168 hours. HematologyNo results for input(s): WBC, RBC, HGB, HCT, MCV, MCH, MCHC, RDW, PLT in Matthew last 168 hours. BNPNo results for input(s): BNP, PROBNP in Matthew last 168 hours.  DDimer No results for input(s): DDIMER in Matthew last 168 hours.   Radiology/Studies:  No  results found.  TIMI Risk Score for Unstable Angina or Non-ST Elevation MI:   Matthew [name]'s TIMI risk score is 4, which indicates a 20% risk of all cause mortality, new or recurrent myocardial infarction or need for urgent revascularization in Matthew next 14 days.   Assessment and Plan:   NSTEMI Presents with 3 weeks of chest pain, severe substernal pressure with associated sob, nausea, and diaphoresis - EKG showed minimal STE in lateral leads with TWI - Troponin 5.98 -  He was given aspirin in Matthew ED at [facility] - check lipid panel and A1C - start BB and high intensity statin - [name] is 1/10 chest pain in Matthew [facility] ED - continue to trend troponin - check echo - NPO for cath Risks and benefits of cardiac catheterization have been discussed with Matthew [name].  These include bleeding, infection, kidney damage, stroke, heart attack, death.  Matthew [name] understands these risks and is willing to proceed. - further recs per cath  HTN  - not on meds at baseline - start BB  HLD - start atorvastatin 80 mg - check lipids  Tobacco use - recommend cessation - has diffuse wheezing on exam  Elevated AST - Ast 142, ALT 30, alk phos 108 - repeat labs - might need US liver - denies abd pain  Severity of Illness: Matthew appropriate [name] status for this [name] is INPATIENT. Inpatient status is judged to be reasonable and necessary in order to provide Matthew required intensity of service to ensure Matthew [name]'s safety. Matthew [name]'s presenting symptoms, physical exam findings, and initial radiographic and laboratory data in Matthew context of their chronic comorbidities is felt to place them at high risk for further clinical deterioration. Furthermore, it is not anticipated that Matthew [name] will be medically stable for discharge from Matthew hospital within 2 midnights of admission. Matthew following factors support Matthew [name] status of inpatient.   " Matthew [name]'s presenting symptoms include chest pain. " Matthew  worrisome physical exam findings include chest pain. " Matthew initial radiographic and laboratory data are worrisome because of elevated troponin. " Matthew chronic co-morbidities include HTN and HLD.   * I certify that at Matthew point of admission it is my clinical judgment that Matthew [name] will require inpatient hospital care spanning beyond 2 midnights from Matthew point of admission due to high intensity of service, high risk for further deterioration and high frequency of surveillance required.*    For questions or updates, please contact [place] Please consult [email or web address] for contact info under        Signed, Jasyn Mey Ninfa Meeker, PA-C  08/03/2019 1:07 PM

## 2019-08-03 NOTE — ED Triage Notes (Signed)
Pt arrives via EMS from [facility] for cath lab. CP since last night but no pain at this time.  1 nitro, 4 mg morphine, 4 mg zofran, 5 mg metropolol, 1 mg dilaudid and 4000 unit heparin bolus given at [place].

## 2019-08-03 NOTE — ED Notes (Signed)
To cath lab NOW 

## 2019-08-03 NOTE — Progress Notes (Signed)
ANTICOAGULATION CONSULT NOTE - Initial Consult  Pharmacy Consult for Heparin  Indication: chest pain/ACS  Not on File  Patient Measurements: Height: 5' 7.5" (171.5 cm) Weight: 82.6 kg (182 lb) IBW/kg (Calculated) : 67.25   Vital Signs: Temp: 97.8 F (36.6 C) (05/12 1204) Temp Source: Oral (05/12 1204) BP: 118/78 (05/12 1400) Pulse Rate: 58 (05/12 1400)  Labs: No results for input(s): HGB, HCT, PLT, APTT, LABPROT, INR, HEPARINUNFRC, HEPRLOWMOCWT, CREATININE, CKTOTAL, CKMB, TROPONINIHS in the last 72 hours.  CrCl cannot be calculated (No successful lab value found.).   Medical History: Past Medical History:  Diagnosis Date  . Depression   . Hyperlipidemia   . Hypertension   . Tobacco use       Assessment: 63yom admitted for STEMI, POBA to OM with residual LAD disease and TCTS consult.  Begin heparin drip 4hr after (radial) sheath removed - out 1330 begin heparin at 1800  Heparin drip rate 1000 uts/hr    Goal of Therapy:  Heparin level 0.3-0.7 units/ml Monitor platelets by anticoagulation protocol: Yes   Plan:  Begin heparin drip 1000 uts/hr  Draw HL 6hr after start Daily HL, CBC Monitor s/s bleeding    Leota Sauers Pharm.D. CPP, BCPS Clinical Pharmacist [phone] 08/03/2019 2:33 PM

## 2019-08-04 ENCOUNTER — Inpatient Hospital Stay (HOSPITAL_COMMUNITY): Payer: Medicare Other

## 2019-08-04 ENCOUNTER — Encounter (HOSPITAL_COMMUNITY): Payer: Self-pay | Admitting: Cardiovascular Disease

## 2019-08-04 DIAGNOSIS — Z01810 Encounter for preprocedural cardiovascular examination: Secondary | ICD-10-CM

## 2019-08-04 DIAGNOSIS — I255 Ischemic cardiomyopathy: Secondary | ICD-10-CM

## 2019-08-04 DIAGNOSIS — Z9861 Coronary angioplasty status: Secondary | ICD-10-CM

## 2019-08-04 DIAGNOSIS — I2510 Atherosclerotic heart disease of native coronary artery without angina pectoris: Secondary | ICD-10-CM

## 2019-08-04 DIAGNOSIS — I25110 Atherosclerotic heart disease of native coronary artery with unstable angina pectoris: Secondary | ICD-10-CM

## 2019-08-04 DIAGNOSIS — F17200 Nicotine dependence, unspecified, uncomplicated: Secondary | ICD-10-CM

## 2019-08-04 DIAGNOSIS — I10 Essential (primary) hypertension: Secondary | ICD-10-CM | POA: Diagnosis present

## 2019-08-04 DIAGNOSIS — E785 Hyperlipidemia, unspecified: Secondary | ICD-10-CM

## 2019-08-04 DIAGNOSIS — Z8673 Personal history of transient ischemic attack (TIA), and cerebral infarction without residual deficits: Secondary | ICD-10-CM

## 2019-08-04 HISTORY — DX: Nicotine dependence, unspecified, uncomplicated: F17.200

## 2019-08-04 HISTORY — DX: Essential (primary) hypertension: I10

## 2019-08-04 HISTORY — DX: Personal history of transient ischemic attack (TIA), and cerebral infarction without residual deficits: Z86.73

## 2019-08-04 HISTORY — DX: Hyperlipidemia, unspecified: E78.5

## 2019-08-04 LAB — BASIC METABOLIC PANEL
Anion gap: 9 (ref 5–15)
BUN: 11 mg/dL (ref 8–23)
CO2: 25 mmol/L (ref 22–32)
Calcium: 9 mg/dL (ref 8.9–10.3)
Chloride: 98 mmol/L (ref 98–111)
Creatinine, Ser: 1.07 mg/dL (ref 0.61–1.24)
GFR calc Af Amer: >60 mL/min (ref >60)
GFR calc non Af Amer: >60 mL/min (ref >60)
Glucose, Bld: 124 mg/dL — ABNORMAL HIGH (ref 70–99)
Potassium: 4.5 mmol/L (ref 3.5–5.1)
Sodium: 132 mmol/L — ABNORMAL LOW (ref 135–145)

## 2019-08-04 LAB — POCT I-STAT 7, (LYTES, BLD GAS, ICA,H+H)
Acid-Base Excess: 2 mmol/L (ref 0.0–2.0)
Bicarbonate: 26.4 mmol/L (ref 20.0–28.0)
Calcium, Ion: 1.27 mmol/L (ref 1.15–1.40)
HCT: 39 % (ref 39.0–52.0)
Hemoglobin: 13.3 g/dL (ref 13.0–17.0)
O2 Saturation: 93 %
Potassium: 3.9 mmol/L (ref 3.5–5.1)
Sodium: 133 mmol/L — ABNORMAL LOW (ref 135–145)
TCO2: 28 mmol/L (ref 22–32)
pCO2 arterial: 39.5 mmHg (ref 32.0–48.0)
pH, Arterial: 7.433 (ref 7.350–7.450)
pO2, Arterial: 65 mmHg — ABNORMAL LOW (ref 83.0–108.0)

## 2019-08-04 LAB — HEMOGLOBIN A1C
Hgb A1c MFr Bld: 6 % — ABNORMAL HIGH (ref 4.8–5.6)
Mean Plasma Glucose: 125.5 mg/dL

## 2019-08-04 LAB — COMPREHENSIVE METABOLIC PANEL
ALT: 54 U/L — ABNORMAL HIGH (ref 0–44)
AST: 173 U/L — ABNORMAL HIGH (ref 15–41)
Albumin: 3 g/dL — ABNORMAL LOW (ref 3.5–5.0)
Alkaline Phosphatase: 78 U/L (ref 38–126)
Anion gap: 12 (ref 5–15)
BUN: 11 mg/dL (ref 8–23)
CO2: 24 mmol/L (ref 22–32)
Calcium: 8.9 mg/dL (ref 8.9–10.3)
Chloride: 97 mmol/L — ABNORMAL LOW (ref 98–111)
Creatinine, Ser: 1.07 mg/dL (ref 0.61–1.24)
GFR calc Af Amer: >60 mL/min (ref >60)
GFR calc non Af Amer: >60 mL/min (ref >60)
Glucose, Bld: 138 mg/dL — ABNORMAL HIGH (ref 70–99)
Potassium: 3.7 mmol/L (ref 3.5–5.1)
Sodium: 133 mmol/L — ABNORMAL LOW (ref 135–145)
Total Bilirubin: 0.6 mg/dL (ref 0.3–1.2)
Total Protein: 6 g/dL — ABNORMAL LOW (ref 6.5–8.1)

## 2019-08-04 LAB — CBC
HCT: 41.9 % (ref 39.0–52.0)
Hemoglobin: 14.1 g/dL (ref 13.0–17.0)
MCH: 30.6 pg (ref 26.0–34.0)
MCHC: 33.7 g/dL (ref 30.0–36.0)
MCV: 90.9 fL (ref 80.0–100.0)
Platelets: 219 10*3/uL (ref 150–400)
RBC: 4.61 MIL/uL (ref 4.22–5.81)
RDW: 12.8 % (ref 11.5–15.5)
WBC: 9.7 10*3/uL (ref 4.0–10.5)
nRBC: 0 % (ref 0.0–0.2)

## 2019-08-04 LAB — LIPID PANEL
Cholesterol: 248 mg/dL — ABNORMAL HIGH (ref 0–200)
HDL: 62 mg/dL (ref >40)
LDL Cholesterol: 160 mg/dL — ABNORMAL HIGH (ref 0–99)
Total CHOL/HDL Ratio: 4 RATIO
Triglycerides: 128 mg/dL (ref <150)
VLDL: 26 mg/dL (ref 0–40)

## 2019-08-04 LAB — URINALYSIS, ROUTINE W REFLEX MICROSCOPIC
Bacteria, UA: NONE SEEN
Bilirubin Urine: NEGATIVE
Glucose, UA: NEGATIVE mg/dL
Ketones, ur: NEGATIVE mg/dL
Leukocytes,Ua: NEGATIVE
Nitrite: NEGATIVE
Protein, ur: NEGATIVE mg/dL
Specific Gravity, Urine: 1.011 (ref 1.005–1.030)
pH: 7 (ref 5.0–8.0)

## 2019-08-04 LAB — PROTIME-INR
INR: 1 (ref 0.8–1.2)
Prothrombin Time: 13.1 seconds (ref 11.4–15.2)

## 2019-08-04 LAB — HEPATIC FUNCTION PANEL
ALT: 59 U/L — ABNORMAL HIGH (ref 0–44)
AST: 219 U/L — ABNORMAL HIGH (ref 15–41)
Albumin: 3.2 g/dL — ABNORMAL LOW (ref 3.5–5.0)
Alkaline Phosphatase: 81 U/L (ref 38–126)
Bilirubin, Direct: <0.1 mg/dL (ref 0.0–0.2)
Total Bilirubin: 0.5 mg/dL (ref 0.3–1.2)
Total Protein: 6.1 g/dL — ABNORMAL LOW (ref 6.5–8.1)

## 2019-08-04 LAB — HEPARIN LEVEL (UNFRACTIONATED)
Heparin Unfractionated: 0.27 IU/mL — ABNORMAL LOW (ref 0.30–0.70)
Heparin Unfractionated: 0.28 IU/mL — ABNORMAL LOW (ref 0.30–0.70)

## 2019-08-04 LAB — TYPE AND SCREEN
ABO/RH(D): A POS
Antibody Screen: NEGATIVE

## 2019-08-04 LAB — ABO/RH: ABO/RH(D): A POS

## 2019-08-04 LAB — APTT: aPTT: 80 seconds — ABNORMAL HIGH (ref 24–36)

## 2019-08-04 MED ORDER — MAGNESIUM SULFATE 50 % IJ SOLN
40.0000 meq | INTRAMUSCULAR | Status: DC
  Filled 2019-08-04: qty 9.85

## 2019-08-04 MED ORDER — DIAZEPAM 5 MG PO TABS
5.0000 mg | ORAL_TABLET | Freq: Once | ORAL | Status: AC
  Administered 2019-08-05: 5 mg via ORAL
  Filled 2019-08-04: qty 1

## 2019-08-04 MED ORDER — DEXMEDETOMIDINE HCL IN NACL 400 MCG/100ML IV SOLN
0.1000 ug/kg/h | INTRAVENOUS | Status: AC
  Administered 2019-08-05: .2 ug/kg/h via INTRAVENOUS
  Filled 2019-08-04: qty 100

## 2019-08-04 MED ORDER — SODIUM CHLORIDE 0.9 % IV SOLN
750.0000 mg | INTRAVENOUS | Status: AC
  Administered 2019-08-05: 750 mg via INTRAVENOUS
  Filled 2019-08-04: qty 750

## 2019-08-04 MED ORDER — VANCOMYCIN HCL 1500 MG/300ML IV SOLN
1500.0000 mg | INTRAVENOUS | Status: DC
  Filled 2019-08-04: qty 300

## 2019-08-04 MED ORDER — PHENYLEPHRINE HCL-NACL 20-0.9 MG/250ML-% IV SOLN
30.0000 ug/min | INTRAVENOUS | Status: AC
  Administered 2019-08-05: 50 ug/min via INTRAVENOUS
  Filled 2019-08-04: qty 250

## 2019-08-04 MED ORDER — MILRINONE LACTATE IN DEXTROSE 20-5 MG/100ML-% IV SOLN
0.3000 ug/kg/min | INTRAVENOUS | Status: AC
  Administered 2019-08-05: .25 ug/kg/min via INTRAVENOUS
  Filled 2019-08-04: qty 100

## 2019-08-04 MED ORDER — TRANEXAMIC ACID 1000 MG/10ML IV SOLN
1.5000 mg/kg/h | INTRAVENOUS | Status: AC
  Administered 2019-08-05: 1.5 mg/kg/h via INTRAVENOUS
  Filled 2019-08-04: qty 25

## 2019-08-04 MED ORDER — CHLORHEXIDINE GLUCONATE CLOTH 2 % EX PADS
6.0000 | MEDICATED_PAD | Freq: Once | CUTANEOUS | Status: AC
  Administered 2019-08-04: 6 via TOPICAL

## 2019-08-04 MED ORDER — POTASSIUM CHLORIDE 2 MEQ/ML IV SOLN
80.0000 meq | INTRAVENOUS | Status: DC
  Filled 2019-08-04: qty 40

## 2019-08-04 MED ORDER — SODIUM CHLORIDE 0.9 % IV SOLN
INTRAVENOUS | Status: DC
  Filled 2019-08-04: qty 30

## 2019-08-04 MED ORDER — MAGNESIUM SULFATE 4 GM/100ML IV SOLN
4.0000 g | Freq: Once | INTRAVENOUS | Status: AC
  Administered 2019-08-04: 4 g via INTRAVENOUS
  Filled 2019-08-04: qty 100

## 2019-08-04 MED ORDER — SODIUM CHLORIDE 0.9 % IV SOLN
1.5000 g | INTRAVENOUS | Status: AC
  Administered 2019-08-05: 1.5 g via INTRAVENOUS
  Filled 2019-08-04: qty 1.5

## 2019-08-04 MED ORDER — CHLORHEXIDINE GLUCONATE 0.12 % MT SOLN
15.0000 mL | Freq: Once | OROMUCOSAL | Status: AC
  Administered 2019-08-05: 15 mL via OROMUCOSAL
  Filled 2019-08-04: qty 15

## 2019-08-04 MED ORDER — BISACODYL 5 MG PO TBEC
5.0000 mg | DELAYED_RELEASE_TABLET | Freq: Once | ORAL | Status: AC
  Administered 2019-08-04: 5 mg via ORAL
  Filled 2019-08-04: qty 1

## 2019-08-04 MED ORDER — TRANEXAMIC ACID (OHS) PUMP PRIME SOLUTION
2.0000 mg/kg | INTRAVENOUS | Status: DC
  Filled 2019-08-04: qty 1.74

## 2019-08-04 MED ORDER — METOPROLOL TARTRATE 12.5 MG HALF TABLET
12.5000 mg | ORAL_TABLET | Freq: Once | ORAL | Status: AC
  Administered 2019-08-05: 12.5 mg via ORAL
  Filled 2019-08-04: qty 1

## 2019-08-04 MED ORDER — EPINEPHRINE HCL 5 MG/250ML IV SOLN IN NS
0.0000 ug/min | INTRAVENOUS | Status: AC
  Administered 2019-08-05: 2 ug/min via INTRAVENOUS
  Filled 2019-08-04: qty 250

## 2019-08-04 MED ORDER — LOSARTAN POTASSIUM 25 MG PO TABS
25.0000 mg | ORAL_TABLET | Freq: Every day | ORAL | Status: DC
  Administered 2019-08-04: 25 mg via ORAL
  Filled 2019-08-04: qty 1

## 2019-08-04 MED ORDER — TRANEXAMIC ACID (OHS) BOLUS VIA INFUSION
15.0000 mg/kg | INTRAVENOUS | Status: AC
  Administered 2019-08-05: 1306.5 mg via INTRAVENOUS
  Filled 2019-08-04: qty 1307

## 2019-08-04 MED ORDER — NOREPINEPHRINE 4 MG/250ML-% IV SOLN
0.0000 ug/min | INTRAVENOUS | Status: DC
  Filled 2019-08-04: qty 250

## 2019-08-04 MED ORDER — NITROGLYCERIN IN D5W 200-5 MCG/ML-% IV SOLN
2.0000 ug/min | INTRAVENOUS | Status: DC
  Filled 2019-08-04: qty 250

## 2019-08-04 MED ORDER — POTASSIUM CHLORIDE CRYS ER 20 MEQ PO TBCR
20.0000 meq | EXTENDED_RELEASE_TABLET | Freq: Once | ORAL | Status: AC
  Administered 2019-08-04: 20 meq via ORAL
  Filled 2019-08-04: qty 1

## 2019-08-04 MED ORDER — PLASMA-LYTE 148 IV SOLN
INTRAVENOUS | Status: DC
  Filled 2019-08-04: qty 2.5

## 2019-08-04 MED ORDER — INSULIN REGULAR(HUMAN) IN NACL 100-0.9 UT/100ML-% IV SOLN
INTRAVENOUS | Status: AC
  Administered 2019-08-05: .9 [IU]/h via INTRAVENOUS
  Filled 2019-08-04: qty 100

## 2019-08-04 MED ORDER — ISOSORBIDE MONONITRATE ER 30 MG PO TB24
30.0000 mg | ORAL_TABLET | Freq: Every day | ORAL | Status: DC
  Administered 2019-08-04: 30 mg via ORAL
  Filled 2019-08-04: qty 1

## 2019-08-04 MED ORDER — TEMAZEPAM 15 MG PO CAPS
15.0000 mg | ORAL_CAPSULE | Freq: Once | ORAL | Status: AC | PRN
  Administered 2019-08-04: 15 mg via ORAL
  Filled 2019-08-04: qty 1

## 2019-08-04 MED ORDER — LEVOTHYROXINE SODIUM 50 MCG PO TABS
50.0000 ug | ORAL_TABLET | Freq: Every day | ORAL | Status: DC
  Administered 2019-08-04 – 2019-08-12 (×8): 50 ug via ORAL
  Filled 2019-08-04 (×9): qty 1

## 2019-08-04 NOTE — Progress Notes (Signed)
CARDIAC REHAB PHASE I   Preop ed completed with pt. Pt given IS and able to demonstrate 2250. Pt educated on importance of IS use, walks, and sternal precautions postop. Pt given in-the-tube sheet along with cardiac surgery booklet. Pt requesting nicotine patch, RN made aware. Will continue to follow pt throughout his hospital stay.  [id number] Reynold Bowen, RN BSN 08/04/2019 1:56 PM

## 2019-08-04 NOTE — Progress Notes (Signed)
ANTICOAGULATION CONSULT NOTE  Pharmacy Consult for Heparin  Indication: chest pain/ACS  Assessment: 63yom admitted for STEMI, POBA to OM with residual LAD disease and TCTS consult.  Begin heparin drip 4hr after (radial) sheath removed - out 1330 begin heparin at 1800  Heparin level this am 0.27 units/ml  Goal of Therapy:  Heparin level 0.3-0.7 units/ml Monitor platelets by anticoagulation protocol: Yes   Plan:  Increase heparin drip to 1100 uts/hr  Draw HL 6hr after rate change Daily HL, CBC Monitor s/s bleeding   Thanks for allowing pharmacy to be a part of this patient's care.  Talbert Cage, PharmD Clinical Pharmacist 08/04/2019 6:55 AM

## 2019-08-04 NOTE — Progress Notes (Signed)
VASCULAR LAB PRELIMINARY  PRELIMINARY  PRELIMINARY  PRELIMINARY  Pre CABG Dopplers completed.    Preliminary report:  See CV proc for preliminary results.  Kiyoshi Schaab, RVT 08/04/2019, 4:25 PM

## 2019-08-04 NOTE — Progress Notes (Signed)
1 Day Post-Op Procedure(s) (LRB): LEFT HEART CATH AND CORONARY ANGIOGRAPHY (N/A) CORONARY BALLOON ANGIOPLASTY (N/A) Subjective: No chest pain or shortness of breath overnight.  Objective: Vital signs in last 24 hours: Temp:  [97.7 F (36.5 C)-98.8 F (37.1 C)] 98.8 F (37.1 C) (05/13 0300) Pulse Rate:  [0-128] 67 (05/13 0700) Cardiac Rhythm: Normal sinus rhythm (05/13 0400) Resp:  [0-23] 15 (05/13 0700) BP: (107-158)/(54-96) 141/80 (05/13 0700) SpO2:  [0 %-100 %] 94 % (05/13 0700) Weight:  [82.6 kg-87.1 kg] 87.1 kg (05/13 0500)  Hemodynamic parameters for last 24 hours:    Intake/Output from previous day: 05/12 0701 - 05/13 0700 In: 800 [I.V.:800] Out: 3100 [Urine:3100] Intake/Output this shift: No intake/output data recorded.  General appearance: alert and cooperative Neurologic: intact Heart: regular rate and rhythm, S1, S2 normal, no murmur Lungs: clear to auscultation bilaterally Extremities: no edema  Lab Results: Recent Labs    08/04/19 0621  WBC 9.7  HGB 14.1  HCT 41.9  PLT 219   BMET:  Recent Labs    08/04/19 0621  NA 132*  K 4.5  CL 98  CO2 25  GLUCOSE 124*  BUN 11  CREATININE 1.07  CALCIUM 9.0    PT/INR: No results for input(s): LABPROT, INR in the last 72 hours. ABG No results found for: PHART, HCO3, TCO2, ACIDBASEDEF, O2SAT CBG (last 3)  No results for input(s): GLUCAP in the last 72 hours.  Assessment/Plan: S/P Procedure(s) (LRB): LEFT HEART CATH AND CORONARY ANGIOGRAPHY (N/A) CORONARY BALLOON ANGIOPLASTY (N/A)  Severe 3 vessel CAD. Plan CABG in am. He is in agreement.  TSH was 13.9 on no meds at home with no dx of hypothyroidism. Will start Synthroid at 50 mcg daily and will need TSH checked in 6 weeks for dose adjustment.   LOS: 1 day    Alleen Borne 08/04/2019

## 2019-08-04 NOTE — Progress Notes (Signed)
Progress Note  Patient Name: Matthew [name] Date of Encounter: 08/04/2019  Primary Cardiologist: Jenean Lindau, MD   Patient Profile     64 y.o. male heavy smoker (1 PPD since age 74) with prior history of CVA,, HTN and HLD cancer but no prior cardiac disease transferred from [facility] with chest pain off and on for 3 weeks.  Pain described as substernal pressure-"like elephant on my chest" with some radiation to the arm.  Symptoms associated with dyspnea nausea and diaphoresis. ->  Presented to ER when he awoke with pain on the morning of 08/03/2019.  In the ED at [facility] BP 167/87, RR 20, pulse 68, 99%O2. Labs showed creatinine 0.90, AST 142, ALT 30, Alk phos 108. Pro BNP 634, WBC 9.3, Hgb 15.8. troponin 5.98. CXR was unremarkable. In the ED he was given ASA and SL nitro which seemed to improve the chest pain. Also received zofran, morphine 76m IV, metoprolol 2.532mIV, pepcid 2035mV, dilaudid 1mg71m. EKG showed. EKG showed sinus 64bpm with minimal STE in lateral leads with TWI. Patient was seen by Dr. RevaGeraldo Pitter recommended transfer to [facility] possible cath. Question whether this was a STEMI but EKG was reviewed by Dr. AridFletcher Anon decided this was NSTEMI.   He was transferred directly to [facility] as a urgent non-STEMI. -->  Cath revealed occluded OM branch with three-vessel CAD.  PTCA of OM performed with plans for staged CABG.  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  Resting comfortably.  Says his chest is a little sore-pointing to his  epigastric area (tender) No more the presenting type chest pain.  No significant dyspnea.  Inpatient Medications    Scheduled Meds: . aspirin  324 mg Oral NOW   Or  . aspirin  300 mg Rectal NOW  . aspirin EC  81 mg Oral Daily  . atorvastatin  80 mg Oral q1800  . Chlorhexidine Gluconate Cloth  6 each Topical Daily  . sodium chloride flush  3 mL Intravenous Q12H  . sodium chloride flush  3 mL Intravenous Q12H   Continuous Infusions: . sodium chloride    . sodium chloride    . heparin 1,000 Units/hr (08/04/19 0000)   PRN Meds: sodium chloride, sodium chloride, acetaminophen, acetaminophen, ALPRAZolam, nitroGLYCERIN, ondansetron (ZOFRAN) IV, ondansetron (ZOFRAN) IV, sodium chloride flush, sodium chloride flush, zolpidem   Vital Signs    Vitals:   08/03/19 2100 08/03/19 2200 08/03/19 2300 08/04/19 0000  BP: (!) 147/69 132/70 133/80 (!) 143/86  Pulse: 66 63 61 65  Resp: _0 Temp:    98.2 F (36.8 C)  TempSrc:    Oral  SpO2: 94% 94% 94% 90%  Weight:      Height:        Intake/Output Summary (Last 24 hours) at 08/04/2019 0219 Last data filed at 08/04/2019 0040 Gross per 24 hour  Intake 730 ml  Output 1900 ml  Net -1170 ml   Last 3 Weights 08/03/2019  Weight (lbs) 182 lb  Weight (kg) 82.555  kg      Telemetry    Mostly sinus rhythm with PACs- Personally Reviewed  ECG    Sinus rhythm, 69 bpm.  Subtle ST segment "hump" with TWI in I, II, II, and aVF more prominent biphasic ST changes in V5 and V6 with T wave inversions.  Findings are consistent with evolutionary changes of inferolateral STEMI.  Subtle Q waves noted in inferior and lateral leads.- Personally Reviewed  Physical Exam   Physical Exam  Constitutional: He is oriented to person, place, and time. He appears well-developed and well-nourished. No distress.  Pleasant appearing gentleman.  Well-groomed.  HENT:  Head: Normocephalic and atraumatic.  Neck: No hepatojugular reflux and no JVD present.  Carotid bruit is not present.  Cardiovascular: Normal rate, regular rhythm, normal heart sounds and intact distal pulses.  Occasional extrasystoles are present. PMI is not displaced. Exam reveals no gallop and no friction rub.  No murmur heard. Pulmonary/Chest: Breath sounds normal. No respiratory distress. He has no wheezes. He has no rales.  Abdominal: Soft. Bowel sounds are normal. He exhibits no distension. There is no abdominal tenderness. There is no rebound.  Musculoskeletal:        General: No edema. Normal range of motion.     Cervical back: Normal range of motion and neck supple.  Neurological: He is alert and oriented to person, place, and time.  Psychiatric: His behavior is normal. Judgment and thought content normal.  Somewhat blunted affect.  Normal  Vitals reviewed.    Labs    High Sensitivity Troponin:  No results for input(s): TROPONINIHS in the last 720 hours.    ChemistryNo results for input(s): NA, K, CL, CO2, GLUCOSE, BUN, CREATININE, CALCIUM, PROT, ALBUMIN, AST, ALT, ALKPHOS, BILITOT, GFRNONAA, GFRAA, ANIONGAP in the last 168 hours.   HematologyNo results for input(s): WBC, RBC, HGB, HCT, MCV, MCH, MCHC, RDW, PLT in the last 168 hours.  BNPNo results for input(s): BNP, PROBNP in the last 168 hours.   DDimer No results for input(s): DDIMER in the last 168 hours.   Radiology    CARDIAC CATHETERIZATION  Result Date: 08/03/2019  Mid RCA to Dist RCA lesion is 60% stenosed.  1st Mrg lesion is 100% stenosed.  Post intervention, there is a 25% residual stenosis.  Balloon angioplasty was performed using a BALLOON SAPPHIRE 2.0X12.  Ost LAD to Prox LAD lesion is 90% stenosed.  1st Diag lesion is 80% stenosed.  There is moderate left ventricular systolic dysfunction.  The left ventricular ejection fraction is 35-45% by visual estimate.  LV end diastolic pressure is moderately elevated.  1.  Significant three-vessel coronary artery disease.  The culprit for myocardial  infarction is an occluded OM1.  There is complex bifurcation proximal LAD/diagonal disease with heavy calcifications and extension all the way back to the ostial LAD.  There is also 60% diffuse disease in the mid to distal right coronary artery. 2.  Moderately reduced LV systolic function with an EF of 35% with global hypokinesis but more pronounced in the inferior apical area.  Moderately elevated left ventricular end-diastolic pressure at 22 mmHg. 3.  Successful balloon angioplasty of OM1 which establish TIMI-3 flow. Recommendations: I elected to treat the culprit OM1 occlusion in order to establish flow.  However, I did not place a stent to avoid the need for dual antiplatelet therapy.  The patient has complex calcified proximal LAD disease at the bifurcation of a large diagonal branch which is also disease.  This is best treated with CABG.  In addition, he has borderline diffuse disease in the mid to distal right coronary artery. Recommend starting heparin drip 4 hours after sheath pull. I consulted CVTS for CABG. the patient was not given a P2 Y 12 inhibitor. An echocardiogram was ordered.   ECHOCARDIOGRAM COMPLETE  Result Date: 08/03/2019    ECHOCARDIOGRAM REPORT   Patient Name:   MCKENNA BORUFF Date of Exam: 08/03/2019 Medical Rec #:  [id number]     Height:       67.5 in Accession #:    [id number]    Weight:       182.0 lb Date of Birth:  1955/08/31     BSA:          1.954 m Patient Age:    56 years      BP:           127/84 mmHg Patient Gender: M             HR:           59 bpm. Exam Location:  Inpatient Procedure: 2D Echo Indications:    chest pain  History:        Patient has no prior history of Echocardiogram examinations.                 CAD; Risk Factors:Current Smoker.  Sonographer:    Johny Chess Referring Phys: [id number] [place]  1. Left ventricular ejection fraction, by estimation, is 35 to 40%. The left ventricle has moderately decreased function. The left ventricle  demonstrates global hypokinesis. The left ventricular internal cavity size was mildly dilated. Left ventricular diastolic parameters are consistent with Grade I diastolic dysfunction (impaired relaxation). Elevated left atrial pressure.  2. Right ventricular systolic function is normal. The right ventricular size is normal. There is mildly elevated pulmonary artery systolic pressure. The estimated right ventricular systolic pressure is 82.5 mmHg.  3. Left atrial size was mildly dilated.  4. The mitral valve is normal in structure. Mild mitral valve regurgitation. No evidence of mitral stenosis.  5. The aortic valve is normal in structure. Aortic valve regurgitation is mild. No aortic stenosis is present.  6. The inferior vena cava is normal in size with greater than 50% respiratory variability, suggesting right atrial pressure of 3 mmHg. FINDINGS  Left Ventricle: Left ventricular ejection fraction, by estimation, is 35 to 40%. The left ventricle has moderately decreased function. The left ventricle demonstrates global hypokinesis. The left ventricular internal cavity size was mildly dilated. There is no left ventricular hypertrophy. Left ventricular diastolic parameters are consistent with Grade I diastolic dysfunction (impaired relaxation). Elevated left atrial pressure.  LV Wall Scoring: The mid and distal lateral wall and basal anteroseptal segment are hypokinetic. Right Ventricle: The right ventricular size is normal. No increase in right ventricular wall thickness. Right ventricular systolic function is normal. There is mildly elevated pulmonary artery systolic pressure. The tricuspid regurgitant velocity is 2.99  m/s, and with an assumed right atrial pressure of 3 mmHg, the estimated right ventricular systolic pressure is 05.3 mmHg. Left Atrium: Left atrial size was mildly dilated. Right Atrium: Right atrial size was normal in size. Pericardium: There is no evidence of pericardial effusion. Mitral Valve: The  mitral valve is normal in structure. Normal mobility of the mitral valve leaflets. Mild mitral valve regurgitation. No evidence of mitral valve stenosis. Tricuspid Valve: The tricuspid valve is normal in structure. Tricuspid valve regurgitation is mild . No evidence of tricuspid stenosis. Aortic Valve: The aortic  valve is normal in structure. Aortic valve regurgitation is mild. Aortic regurgitation PHT measures 560 msec. No aortic stenosis is present. Pulmonic Valve: The pulmonic valve was normal in structure. Pulmonic valve regurgitation is not visualized. No evidence of pulmonic stenosis. Aorta: The aortic root is normal in size and structure. Venous: The inferior vena cava is normal in size with greater than 50% respiratory variability, suggesting right atrial pressure of 3 mmHg. IAS/Shunts: No atrial level shunt detected by color flow Doppler.  LEFT VENTRICLE PLAX 2D LVIDd:         5.60 cm  Diastology LVIDs:         4.40 cm  LV e' lateral:   9.14 cm/s LV PW:         0.80 cm  LV E/e' lateral: 8.8 LV IVS:        0.80 cm  LV e' medial:    6.96 cm/s LVOT diam:     2.00 cm  LV E/e' medial:  11.5 LV SV:         74 LV SV Index:   38 LVOT Area:     3.14 cm  RIGHT VENTRICLE RV S prime:     13.50 cm/s TAPSE (M-mode): 2.0 cm LEFT ATRIUM             Index       RIGHT ATRIUM           Index LA diam:        4.20 cm 2.15 cm/m  RA Area:     16.00 cm LA Vol (A2C):   61.1 ml 31.28 ml/m RA Volume:   41.20 ml  21.09 ml/m LA Vol (A4C):   80.4 ml 41.16 ml/m LA Biplane Vol: 76.0 ml 38.90 ml/m  AORTIC VALVE LVOT Vmax:   101.00 cm/s LVOT Vmean:  63.300 cm/s LVOT VTI:    0.236 m AI PHT:      560 msec  AORTA Ao Root diam: 3.10 cm MITRAL VALVE               TRICUSPID VALVE MV Area (PHT): 2.39 cm    TR Peak grad:   35.8 mmHg MV Decel Time: 317 msec    TR Vmax:        299.00 cm/s MV E velocity: 80.10 cm/s MV A velocity: 81.80 cm/s  SHUNTS MV E/A ratio:  0.98        Systemic VTI:  0.24 m                            Systemic Diam: 2.00  cm Ena Dawley MD Electronically signed by Ena Dawley MD Signature Date/Time: 08/03/2019/7:03:47 PM    Final     Cardiac Study - Summaries    08/03/2019 = Cardiac Cath-PTCA: Significant 3V CAD.  Culprit lesion- 100% mOM1 (PTCA only - 25%), ostial and prox LAD 90%-ostD1 80% (bifurcation), mid to distal RCA 60%.  EF 35-40%.  Moderately elevated LVEDP. -->  PTCA only performed to avoid DAPT requirement (no P2 Y 12 inhibitor).  CVTS consultation for CABG.    08/03/2019= Echo: EF 35-40% with moderately decreased function.  Global HK.  GR 1 DD.  Elevated LAP.  Mildly elevated PA P estimated 39 mmHg.  Mild LA dilation.  Normal CVP/RAP   Assessment & Plan     Principal Problem:  NSTEMI (non-ST elevated myocardial infarction) ([place]) Multivessel coronary artery disease involving native coronary artery of native heart with  unstable angina pectoris (HCC) -->S/P percutaneous coronary angioplasty of 100% OM1  Seen by Dr. Cyndia Bent from [place].  Plan potential CABG on Friday, 08/05/2019  On high-dose/high intensity statin and aspirin.  Not currently on beta-blocker (heart rate has been somewhat low).  No BB due to bradycardia & pauses   We will add low-dose Imdur.     Ischemic cardiomyopathy: EF 35 to 40% with global hypokinesis.  Not able to use beta-blockers because of pauses.  Will add ARB     Essential hypertension  Start ARB today     Hyperlipidemia with target LDL less than 70: On atorvastatin 80 mg.    Lipid panel with LDL of 160--May need PCSK9 inhibitor or even potentially inclisiran  LFTs were mildly elevated on arrival.  May very well be related to MI.  Labs reordered.     Heavy smoker 1 PPD since age 15 -> smoking cessation counseling     History of stroke -> no significant residual symptoms   For questions or updates, please contact [place] Please consult [email or web address] for contact info under        Signed, Glenetta Hew, MD  08/04/2019, 2:19 AM

## 2019-08-04 NOTE — Progress Notes (Signed)
ANTICOAGULATION CONSULT NOTE  Pharmacy Consult for Heparin  Indication: chest pain/ACS  No Known Allergies  Patient Measurements: Height: 5' 7.5" (171.5 cm) Weight: 82.7 kg (182 lb 5.1 oz) IBW/kg (Calculated) : 67.25   Vital Signs: Temp: 98.6 F (37 C) (05/13 1900) Temp Source: Oral (05/13 1900) BP: 131/67 (05/13 1300) Pulse Rate: 85 (05/13 1800)  Labs: Recent Labs    08/04/19 0621 08/04/19 1304 08/04/19 1322 08/04/19 2019  HGB 14.1  --  13.3  --   HCT 41.9  --  39.0  --   PLT 219  --   --   --   APTT  --  80*  --   --   LABPROT  --  13.1  --   --   INR  --  1.0  --   --   HEPARINUNFRC 0.27*  --   --  0.28*  CREATININE 1.07 1.07  --   --     Estimated Creatinine Clearance: 74.4 mL/min (by C-G formula based on SCr of 1.07 mg/dL).   Medical History: Past Medical History:  Diagnosis Date  . Depression   . Essential hypertension 08/04/2019  . Heavy smoker 1 PPD since age 44 08/04/2019  . History of stroke 08/04/2019  . Hyperlipidemia with target LDL less than 70 08/04/2019  . Hypertension   . Ischemic cardiomyopathy 08/03/2019   Echo: EF 35-40% with moderately decreased function.  Global HK.  GR 1 DD.  Elevated LAP.  Mildly elevated PA P estimated 39 mmHg.  Mild LA dilation.  Normal CVP/RAP  . Multivessel coronary artery disease involving native coronary artery of native heart with unstable angina pectoris (HCC) 08/03/2019   Cardiac Cath-PTCA: Significant 3V CAD.  Culprit lesion- 100% mOM1 (PTCA only - 25%), ostial and prox LAD 90%-ostD1 80% (bifurcation), mid to distal RCA 60%.  EF 35-40%.  Moderately elevated LVEDP. -->  PTCA only performed to avoid DAPT requirement (no P2 Y 12 inhibitor).  CVTS consultation for CABG.  . NSTEMI (non-ST elevated myocardial infarction) (HCC) 08/03/2019  . Tobacco use       Assessment: 64yom admitted for STEMI, POBA to OM with residual LAD disease and TCTS consult.  Begin heparin drip 4hr after (radial) sheath removed - out 1330  begin heparin at 1800   Heparin level slightly below goal level on 1100 units/hr.  Planning CABG in AM.   Goal of Therapy:  Heparin level 0.3-0.7 units/ml Monitor platelets by anticoagulation protocol: Yes   Plan:  Increase heparin just slightly to 1150 units/hr. Daily HL, CBC Monitor s/s bleeding    Jenetta Downer, [facility] Clinical Pharmacist Phone [phone]  08/04/2019 9:12 PM

## 2019-08-05 ENCOUNTER — Inpatient Hospital Stay (HOSPITAL_COMMUNITY)
Admission: EM | Disposition: A | Discharge status: Home Health | Payer: Self-pay | Source: Other Acute Inpatient Hospital | Attending: Surgery

## 2019-08-05 ENCOUNTER — Inpatient Hospital Stay (HOSPITAL_COMMUNITY): Payer: Medicare Other | Admitting: Certified Registered"

## 2019-08-05 ENCOUNTER — Inpatient Hospital Stay (HOSPITAL_COMMUNITY): Payer: Medicare Other

## 2019-08-05 ENCOUNTER — Encounter (HOSPITAL_COMMUNITY): Payer: Self-pay | Admitting: Cardiovascular Disease

## 2019-08-05 DIAGNOSIS — Z951 Presence of aortocoronary bypass graft: Secondary | ICD-10-CM

## 2019-08-05 DIAGNOSIS — I2510 Atherosclerotic heart disease of native coronary artery without angina pectoris: Secondary | ICD-10-CM | POA: Diagnosis present

## 2019-08-05 HISTORY — PX: CORONARY ARTERY BYPASS GRAFT: SHX141

## 2019-08-05 HISTORY — PX: TEE WITHOUT CARDIOVERSION: SHX5443

## 2019-08-05 LAB — GLUCOSE, CAPILLARY
Glucose-Capillary: 139 mg/dL — ABNORMAL HIGH (ref 70–99)
Glucose-Capillary: 149 mg/dL — ABNORMAL HIGH (ref 70–99)
Glucose-Capillary: 153 mg/dL — ABNORMAL HIGH (ref 70–99)
Glucose-Capillary: 149 mg/dL — ABNORMAL HIGH (ref 70–99)
Glucose-Capillary: 144 mg/dL — ABNORMAL HIGH (ref 70–99)
Glucose-Capillary: 139 mg/dL — ABNORMAL HIGH (ref 70–99)
Glucose-Capillary: 141 mg/dL — ABNORMAL HIGH (ref 70–99)
Glucose-Capillary: 125 mg/dL — ABNORMAL HIGH (ref 70–99)
Glucose-Capillary: 159 mg/dL — ABNORMAL HIGH (ref 70–99)

## 2019-08-05 LAB — BASIC METABOLIC PANEL
Anion gap: 10 (ref 5–15)
Anion gap: 10 (ref 5–15)
BUN: 10 mg/dL (ref 8–23)
BUN: 9 mg/dL (ref 8–23)
CO2: 25 mmol/L (ref 22–32)
CO2: 22 mmol/L (ref 22–32)
Calcium: 9.1 mg/dL (ref 8.9–10.3)
Calcium: 7.8 mg/dL — ABNORMAL LOW (ref 8.9–10.3)
Chloride: 99 mmol/L (ref 98–111)
Chloride: 103 mmol/L (ref 98–111)
Creatinine, Ser: 1.08 mg/dL (ref 0.61–1.24)
Creatinine, Ser: 0.96 mg/dL (ref 0.61–1.24)
GFR calc Af Amer: >60 mL/min (ref >60)
GFR calc Af Amer: >60 mL/min (ref >60)
GFR calc non Af Amer: >60 mL/min (ref >60)
GFR calc non Af Amer: >60 mL/min (ref >60)
Glucose, Bld: 117 mg/dL — ABNORMAL HIGH (ref 70–99)
Glucose, Bld: 141 mg/dL — ABNORMAL HIGH (ref 70–99)
Potassium: 4.5 mmol/L (ref 3.5–5.1)
Potassium: 4.6 mmol/L (ref 3.5–5.1)
Sodium: 134 mmol/L — ABNORMAL LOW (ref 135–145)
Sodium: 135 mmol/L (ref 135–145)

## 2019-08-05 LAB — POCT I-STAT 7, (LYTES, BLD GAS, ICA,H+H)
Acid-base deficit: 3 mmol/L — ABNORMAL HIGH (ref 0.0–2.0)
Acid-base deficit: 2 mmol/L (ref 0.0–2.0)
Acid-base deficit: 1 mmol/L (ref 0.0–2.0)
Bicarbonate: 24.9 mmol/L (ref 20.0–28.0)
Bicarbonate: 24.1 mmol/L (ref 20.0–28.0)
Bicarbonate: 25.4 mmol/L (ref 20.0–28.0)
Calcium, Ion: 1.21 mmol/L (ref 1.15–1.40)
Calcium, Ion: 1.17 mmol/L (ref 1.15–1.40)
Calcium, Ion: 1.19 mmol/L (ref 1.15–1.40)
HCT: 33 % — ABNORMAL LOW (ref 39.0–52.0)
HCT: 30 % — ABNORMAL LOW (ref 39.0–52.0)
HCT: 32 % — ABNORMAL LOW (ref 39.0–52.0)
Hemoglobin: 11.2 g/dL — ABNORMAL LOW (ref 13.0–17.0)
Hemoglobin: 10.2 g/dL — ABNORMAL LOW (ref 13.0–17.0)
Hemoglobin: 10.9 g/dL — ABNORMAL LOW (ref 13.0–17.0)
O2 Saturation: 93 %
O2 Saturation: 94 %
O2 Saturation: 93 %
Patient temperature: 36.4
Patient temperature: 36.8
Patient temperature: 37.5
Potassium: 4 mmol/L (ref 3.5–5.1)
Potassium: 4.4 mmol/L (ref 3.5–5.1)
Potassium: 5.1 mmol/L (ref 3.5–5.1)
Sodium: 137 mmol/L (ref 135–145)
Sodium: 137 mmol/L (ref 135–145)
Sodium: 136 mmol/L (ref 135–145)
TCO2: 27 mmol/L (ref 22–32)
TCO2: 26 mmol/L (ref 22–32)
TCO2: 27 mmol/L (ref 22–32)
pCO2 arterial: 55.7 mmHg — ABNORMAL HIGH (ref 32.0–48.0)
pCO2 arterial: 48.1 mmHg — ABNORMAL HIGH (ref 32.0–48.0)
pCO2 arterial: 49.2 mmHg — ABNORMAL HIGH (ref 32.0–48.0)
pH, Arterial: 7.255 — ABNORMAL LOW (ref 7.350–7.450)
pH, Arterial: 7.307 — ABNORMAL LOW (ref 7.350–7.450)
pH, Arterial: 7.323 — ABNORMAL LOW (ref 7.350–7.450)
pO2, Arterial: 78 mmHg — ABNORMAL LOW (ref 83.0–108.0)
pO2, Arterial: 77 mmHg — ABNORMAL LOW (ref 83.0–108.0)
pO2, Arterial: 75 mmHg — ABNORMAL LOW (ref 83.0–108.0)

## 2019-08-05 LAB — CBC
HCT: 42.7 % (ref 39.0–52.0)
HCT: 32.5 % — ABNORMAL LOW (ref 39.0–52.0)
HCT: 33.1 % — ABNORMAL LOW (ref 39.0–52.0)
Hemoglobin: 14.4 g/dL (ref 13.0–17.0)
Hemoglobin: 10.6 g/dL — ABNORMAL LOW (ref 13.0–17.0)
Hemoglobin: 10.9 g/dL — ABNORMAL LOW (ref 13.0–17.0)
MCH: 30.8 pg (ref 26.0–34.0)
MCH: 30.5 pg (ref 26.0–34.0)
MCH: 30.8 pg (ref 26.0–34.0)
MCHC: 33.7 g/dL (ref 30.0–36.0)
MCHC: 32.6 g/dL (ref 30.0–36.0)
MCHC: 32.9 g/dL (ref 30.0–36.0)
MCV: 91.4 fL (ref 80.0–100.0)
MCV: 93.7 fL (ref 80.0–100.0)
MCV: 93.5 fL (ref 80.0–100.0)
Platelets: 234 10*3/uL (ref 150–400)
Platelets: 141 10*3/uL — ABNORMAL LOW (ref 150–400)
Platelets: 160 10*3/uL (ref 150–400)
RBC: 4.67 MIL/uL (ref 4.22–5.81)
RBC: 3.47 MIL/uL — ABNORMAL LOW (ref 4.22–5.81)
RBC: 3.54 MIL/uL — ABNORMAL LOW (ref 4.22–5.81)
RDW: 12.7 % (ref 11.5–15.5)
RDW: 12.9 % (ref 11.5–15.5)
RDW: 13 % (ref 11.5–15.5)
WBC: 10.7 10*3/uL — ABNORMAL HIGH (ref 4.0–10.5)
WBC: 9.9 10*3/uL (ref 4.0–10.5)
WBC: 9.2 10*3/uL (ref 4.0–10.5)
nRBC: 0 % (ref 0.0–0.2)
nRBC: 0 % (ref 0.0–0.2)
nRBC: 0 % (ref 0.0–0.2)

## 2019-08-05 LAB — PLATELET COUNT: Platelets: 135 10*3/uL — ABNORMAL LOW (ref 150–400)

## 2019-08-05 LAB — ECHO INTRAOPERATIVE TEE
Height: 67.5 in
Weight: 2857.6 oz

## 2019-08-05 LAB — PROTIME-INR
INR: 1.4 — ABNORMAL HIGH (ref 0.8–1.2)
Prothrombin Time: 16.4 seconds — ABNORMAL HIGH (ref 11.4–15.2)

## 2019-08-05 LAB — MAGNESIUM: Magnesium: 2.6 mg/dL — ABNORMAL HIGH (ref 1.7–2.4)

## 2019-08-05 LAB — APTT: aPTT: 37 seconds — ABNORMAL HIGH (ref 24–36)

## 2019-08-05 LAB — HEPARIN LEVEL (UNFRACTIONATED): Heparin Unfractionated: 0.35 IU/mL (ref 0.30–0.70)

## 2019-08-05 LAB — HEMOGLOBIN AND HEMATOCRIT, BLOOD
HCT: 28.2 % — ABNORMAL LOW (ref 39.0–52.0)
Hemoglobin: 9.5 g/dL — ABNORMAL LOW (ref 13.0–17.0)

## 2019-08-05 SURGERY — CORONARY ARTERY BYPASS GRAFTING (CABG)
Anesthesia: General | Site: Chest

## 2019-08-05 MED ORDER — ACETAMINOPHEN 500 MG PO TABS
1000.0000 mg | ORAL_TABLET | Freq: Four times a day (QID) | ORAL | Status: DC
  Administered 2019-08-05 – 2019-08-08 (×9): 1000 mg via ORAL
  Filled 2019-08-05 (×10): qty 2

## 2019-08-05 MED ORDER — METOPROLOL TARTRATE 12.5 MG HALF TABLET: 12.5000 mg | ORAL_TABLET | Freq: Two times a day (BID) | ORAL | Status: DC

## 2019-08-05 MED ORDER — DEXMEDETOMIDINE HCL IN NACL 400 MCG/100ML IV SOLN
0.0000 ug/kg/h | INTRAVENOUS | Status: DC
  Administered 2019-08-05: 0.5 ug/kg/h via INTRAVENOUS

## 2019-08-05 MED ORDER — FAMOTIDINE IN NACL 20-0.9 MG/50ML-% IV SOLN: 20.0000 mg | Freq: Two times a day (BID) | INTRAVENOUS | Status: DC

## 2019-08-05 MED ORDER — HEPARIN SODIUM (PORCINE) 1000 UNIT/ML IJ SOLN
INTRAMUSCULAR | Status: DC | PRN
  Administered 2019-08-05: 28000 [IU] via INTRAVENOUS

## 2019-08-05 MED ORDER — INSULIN REGULAR(HUMAN) IN NACL 100-0.9 UT/100ML-% IV SOLN
INTRAVENOUS | Status: DC
  Administered 2019-08-05: 1.6 [IU]/h via INTRAVENOUS
  Filled 2019-08-05: qty 100

## 2019-08-05 MED ORDER — ALBUMIN HUMAN 5 % IV SOLN
250.0000 mL | INTRAVENOUS | Status: DC | PRN
  Administered 2019-08-05 – 2019-08-06 (×3): 12.5 g via INTRAVENOUS
  Filled 2019-08-05: qty 250

## 2019-08-05 MED ORDER — VANCOMYCIN HCL IN DEXTROSE 1-5 GM/200ML-% IV SOLN
1000.0000 mg | Freq: Once | INTRAVENOUS | Status: AC
  Administered 2019-08-05: 1000 mg via INTRAVENOUS
  Filled 2019-08-05: qty 200

## 2019-08-05 MED ORDER — SODIUM CHLORIDE 0.9 % IV SOLN
1.5000 g | Freq: Two times a day (BID) | INTRAVENOUS | Status: AC
  Administered 2019-08-05 – 2019-08-07 (×4): 1.5 g via INTRAVENOUS
  Filled 2019-08-05 (×4): qty 1.5

## 2019-08-05 MED ORDER — FENTANYL CITRATE (PF) 250 MCG/5ML IJ SOLN
INTRAMUSCULAR | Status: DC | PRN
  Administered 2019-08-05: 250 ug via INTRAVENOUS
  Administered 2019-08-05 (×2): 150 ug via INTRAVENOUS
  Administered 2019-08-05 (×2): 100 ug via INTRAVENOUS
  Administered 2019-08-05: 250 ug via INTRAVENOUS

## 2019-08-05 MED ORDER — SODIUM CHLORIDE 0.9% FLUSH
3.0000 mL | Freq: Two times a day (BID) | INTRAVENOUS | Status: DC
  Administered 2019-08-06 – 2019-08-08 (×3): 3 mL via INTRAVENOUS

## 2019-08-05 MED ORDER — LACTATED RINGERS IV SOLN: INTRAVENOUS | Status: DC

## 2019-08-05 MED ORDER — PHENYLEPHRINE HCL-NACL 20-0.9 MG/250ML-% IV SOLN
0.0000 ug/min | INTRAVENOUS | Status: DC
  Administered 2019-08-05: 20 ug/min via INTRAVENOUS

## 2019-08-05 MED ORDER — ALBUMIN HUMAN 5 % IV SOLN: INTRAVENOUS | Status: DC | PRN

## 2019-08-05 MED ORDER — SODIUM CHLORIDE 0.9 % IV SOLN: 250.0000 mL | INTRAVENOUS | Status: DC

## 2019-08-05 MED ORDER — AMIODARONE HCL IN DEXTROSE 360-4.14 MG/200ML-% IV SOLN
INTRAVENOUS | Status: AC
  Filled 2019-08-05: qty 200

## 2019-08-05 MED ORDER — ASPIRIN EC 325 MG PO TBEC
325.0000 mg | DELAYED_RELEASE_TABLET | Freq: Every day | ORAL | Status: DC
  Administered 2019-08-06 – 2019-08-08 (×3): 325 mg via ORAL
  Filled 2019-08-05 (×3): qty 1

## 2019-08-05 MED ORDER — AMIODARONE HCL IN DEXTROSE 360-4.14 MG/200ML-% IV SOLN
INTRAVENOUS | Status: DC | PRN
  Administered 2019-08-05 (×2): 60 mg/h via INTRAVENOUS

## 2019-08-05 MED ORDER — 0.9 % SODIUM CHLORIDE (POUR BTL) OPTIME
TOPICAL | Status: DC | PRN
  Administered 2019-08-05: 5000 mL

## 2019-08-05 MED ORDER — THROMBIN (RECOMBINANT) 20000 UNITS EX SOLR
CUTANEOUS | Status: AC
  Filled 2019-08-05: qty 20000

## 2019-08-05 MED ORDER — METOPROLOL TARTRATE 25 MG/10 ML ORAL SUSPENSION: 12.5000 mg | Freq: Two times a day (BID) | ORAL | Status: DC

## 2019-08-05 MED ORDER — OXYCODONE HCL 5 MG PO TABS
5.0000 mg | ORAL_TABLET | ORAL | Status: DC | PRN
  Administered 2019-08-05 – 2019-08-07 (×10): 5 mg via ORAL
  Filled 2019-08-05 (×11): qty 1

## 2019-08-05 MED ORDER — MAGNESIUM SULFATE 4 GM/100ML IV SOLN
INTRAVENOUS | Status: AC
  Administered 2019-08-05: 4 g via INTRAVENOUS
  Filled 2019-08-05: qty 100

## 2019-08-05 MED ORDER — ASPIRIN 81 MG PO CHEW: 324.0000 mg | CHEWABLE_TABLET | Freq: Every day | ORAL | Status: DC

## 2019-08-05 MED ORDER — CHLORHEXIDINE GLUCONATE 0.12 % MT SOLN
15.0000 mL | OROMUCOSAL | Status: AC
  Administered 2019-08-05: 15 mL via OROMUCOSAL

## 2019-08-05 MED ORDER — ACETAMINOPHEN 160 MG/5ML PO SOLN: 1000.0000 mg | Freq: Four times a day (QID) | ORAL | Status: DC

## 2019-08-05 MED ORDER — MIDAZOLAM HCL 5 MG/5ML IJ SOLN
INTRAMUSCULAR | Status: DC | PRN
  Administered 2019-08-05 (×2): 3 mg via INTRAVENOUS
  Administered 2019-08-05: 2 mg via INTRAVENOUS

## 2019-08-05 MED ORDER — ORAL CARE MOUTH RINSE
15.0000 mL | OROMUCOSAL | Status: DC
  Administered 2019-08-05: 15 mL via OROMUCOSAL

## 2019-08-05 MED ORDER — ACETAMINOPHEN 650 MG RE SUPP: 650.0000 mg | Freq: Once | RECTAL | Status: DC

## 2019-08-05 MED ORDER — FENTANYL CITRATE (PF) 250 MCG/5ML IJ SOLN
INTRAMUSCULAR | Status: AC
  Filled 2019-08-05: qty 25

## 2019-08-05 MED ORDER — POTASSIUM CHLORIDE 10 MEQ/50ML IV SOLN: 10.0000 meq | INTRAVENOUS | Status: AC

## 2019-08-05 MED ORDER — METOPROLOL TARTRATE 5 MG/5ML IV SOLN: 2.5000 mg | INTRAVENOUS | Status: DC | PRN

## 2019-08-05 MED ORDER — HEMOSTATIC AGENTS (NO CHARGE) OPTIME
TOPICAL | Status: DC | PRN
  Administered 2019-08-05: 1 via TOPICAL

## 2019-08-05 MED ORDER — TRAMADOL HCL 50 MG PO TABS
50.0000 mg | ORAL_TABLET | ORAL | Status: DC | PRN
  Administered 2019-08-07: 50 mg via ORAL
  Filled 2019-08-05: qty 1

## 2019-08-05 MED ORDER — CHLORHEXIDINE GLUCONATE 0.12% ORAL RINSE (MEDLINE KIT): 15.0000 mL | Freq: Two times a day (BID) | OROMUCOSAL | Status: DC

## 2019-08-05 MED ORDER — SODIUM CHLORIDE 0.9 % IV SOLN: INTRAVENOUS | Status: DC

## 2019-08-05 MED ORDER — AMIODARONE IV BOLUS ONLY 150 MG/100ML
INTRAVENOUS | Status: DC | PRN
  Administered 2019-08-05: 150 mg via INTRAVENOUS

## 2019-08-05 MED ORDER — PLASMA-LYTE 148 IV SOLN
INTRAVENOUS | Status: DC | PRN
  Administered 2019-08-05: 500 mL via INTRAVASCULAR

## 2019-08-05 MED ORDER — BISACODYL 5 MG PO TBEC
10.0000 mg | DELAYED_RELEASE_TABLET | Freq: Every day | ORAL | Status: DC
  Administered 2019-08-06 – 2019-08-08 (×3): 10 mg via ORAL
  Filled 2019-08-05 (×3): qty 2

## 2019-08-05 MED ORDER — HEPARIN SODIUM (PORCINE) 1000 UNIT/ML IJ SOLN
INTRAMUSCULAR | Status: AC
  Filled 2019-08-05: qty 1

## 2019-08-05 MED ORDER — DEXTROSE 50 % IV SOLN: 0.0000 mL | INTRAVENOUS | Status: DC | PRN

## 2019-08-05 MED ORDER — PROPOFOL 10 MG/ML IV BOLUS
INTRAVENOUS | Status: DC | PRN
  Administered 2019-08-05: 50 mg via INTRAVENOUS

## 2019-08-05 MED ORDER — LACTATED RINGERS IV SOLN: INTRAVENOUS | Status: DC | PRN

## 2019-08-05 MED ORDER — SODIUM CHLORIDE 0.45 % IV SOLN: INTRAVENOUS | Status: DC | PRN

## 2019-08-05 MED ORDER — SODIUM CHLORIDE 0.9% FLUSH: 3.0000 mL | INTRAVENOUS | Status: DC | PRN

## 2019-08-05 MED ORDER — MIDAZOLAM HCL (PF) 10 MG/2ML IJ SOLN
INTRAMUSCULAR | Status: AC
  Filled 2019-08-05: qty 2

## 2019-08-05 MED ORDER — ACETAMINOPHEN 160 MG/5ML PO SOLN: 650.0000 mg | Freq: Once | ORAL | Status: DC

## 2019-08-05 MED ORDER — LACTATED RINGERS IV SOLN: 500.0000 mL | Freq: Once | INTRAVENOUS | Status: DC | PRN

## 2019-08-05 MED ORDER — NITROGLYCERIN IN D5W 200-5 MCG/ML-% IV SOLN: 0.0000 ug/min | INTRAVENOUS | Status: DC

## 2019-08-05 MED ORDER — THROMBIN 20000 UNITS EX SOLR
CUTANEOUS | Status: DC | PRN
  Administered 2019-08-05: 20000 [IU] via TOPICAL

## 2019-08-05 MED ORDER — THROMBIN 20000 UNITS EX SOLR
OROMUCOSAL | Status: DC | PRN
  Administered 2019-08-05 (×3): 4 mL via TOPICAL

## 2019-08-05 MED ORDER — MAGNESIUM SULFATE 4 GM/100ML IV SOLN: 4.0000 g | Freq: Once | INTRAVENOUS | Status: AC

## 2019-08-05 MED ORDER — PANTOPRAZOLE SODIUM 40 MG PO TBEC
40.0000 mg | DELAYED_RELEASE_TABLET | Freq: Every day | ORAL | Status: DC
  Administered 2019-08-07 – 2019-08-08 (×2): 40 mg via ORAL
  Filled 2019-08-05 (×2): qty 1

## 2019-08-05 MED ORDER — DOCUSATE SODIUM 100 MG PO CAPS
200.0000 mg | ORAL_CAPSULE | Freq: Every day | ORAL | Status: DC
  Administered 2019-08-06 – 2019-08-08 (×3): 200 mg via ORAL
  Filled 2019-08-05 (×3): qty 2

## 2019-08-05 MED ORDER — FAMOTIDINE IN NACL 20-0.9 MG/50ML-% IV SOLN
INTRAVENOUS | Status: AC
  Administered 2019-08-05: 20 mg via INTRAVENOUS
  Filled 2019-08-05: qty 50

## 2019-08-05 MED ORDER — MIDAZOLAM HCL 2 MG/2ML IJ SOLN: 2.0000 mg | INTRAMUSCULAR | Status: DC | PRN

## 2019-08-05 MED ORDER — ONDANSETRON HCL 4 MG/2ML IJ SOLN: 4.0000 mg | Freq: Four times a day (QID) | INTRAMUSCULAR | Status: DC | PRN

## 2019-08-05 MED ORDER — PHENYLEPHRINE 40 MCG/ML (10ML) SYRINGE FOR IV PUSH (FOR BLOOD PRESSURE SUPPORT)
PREFILLED_SYRINGE | INTRAVENOUS | Status: DC | PRN
  Administered 2019-08-05: 80 ug via INTRAVENOUS
  Administered 2019-08-05 (×2): 120 ug via INTRAVENOUS
  Administered 2019-08-05: 80 ug via INTRAVENOUS

## 2019-08-05 MED ORDER — MILRINONE LACTATE IN DEXTROSE 20-5 MG/100ML-% IV SOLN
0.0000 ug/kg/min | INTRAVENOUS | Status: DC
  Administered 2019-08-05 – 2019-08-07 (×4): 0.25 ug/kg/min via INTRAVENOUS
  Filled 2019-08-05 (×3): qty 100

## 2019-08-05 MED ORDER — PROPOFOL 10 MG/ML IV BOLUS
INTRAVENOUS | Status: AC
  Filled 2019-08-05: qty 20

## 2019-08-05 MED ORDER — BISACODYL 10 MG RE SUPP: 10.0000 mg | Freq: Every day | RECTAL | Status: DC

## 2019-08-05 MED ORDER — EPINEPHRINE HCL 5 MG/250ML IV SOLN IN NS: 0.0000 ug/min | INTRAVENOUS | Status: DC

## 2019-08-05 MED ORDER — AMIODARONE HCL IN DEXTROSE 360-4.14 MG/200ML-% IV SOLN: 30.0000 mg/h | INTRAVENOUS | Status: DC

## 2019-08-05 MED ORDER — ROCURONIUM BROMIDE 10 MG/ML (PF) SYRINGE
PREFILLED_SYRINGE | INTRAVENOUS | Status: DC | PRN
  Administered 2019-08-05: 100 mg via INTRAVENOUS
  Administered 2019-08-05: 20 mg via INTRAVENOUS
  Administered 2019-08-05: 100 mg via INTRAVENOUS

## 2019-08-05 MED ORDER — CHLORHEXIDINE GLUCONATE 0.12 % MT SOLN
OROMUCOSAL | Status: AC
  Filled 2019-08-05: qty 15

## 2019-08-05 MED ORDER — AMIODARONE HCL IN DEXTROSE 360-4.14 MG/200ML-% IV SOLN
60.0000 mg/h | INTRAVENOUS | Status: DC
  Administered 2019-08-05: 30 mg/h via INTRAVENOUS
  Administered 2019-08-06 – 2019-08-08 (×4): 60 mg/h via INTRAVENOUS
  Filled 2019-08-05 (×10): qty 200

## 2019-08-05 MED ORDER — PROTAMINE SULFATE 10 MG/ML IV SOLN
INTRAVENOUS | Status: DC | PRN
  Administered 2019-08-05 (×4): 50 mg via INTRAVENOUS
  Administered 2019-08-05: 30 mg via INTRAVENOUS

## 2019-08-05 MED ORDER — MORPHINE SULFATE (PF) 2 MG/ML IV SOLN
1.0000 mg | INTRAVENOUS | Status: DC | PRN
  Administered 2019-08-05: 2 mg via INTRAVENOUS
  Administered 2019-08-05: 3 mg via INTRAVENOUS
  Administered 2019-08-07 (×3): 2 mg via INTRAVENOUS
  Filled 2019-08-05 (×2): qty 1
  Filled 2019-08-05: qty 2
  Filled 2019-08-05 (×2): qty 1

## 2019-08-05 SURGICAL SUPPLY — 106 items
BAG DECANTER FOR FLEXI CONT (MISCELLANEOUS) ×3 IMPLANT
BLADE CLIPPER SURG (BLADE) IMPLANT
BLADE STERNUM SYSTEM 6 (BLADE) ×3 IMPLANT
BNDG ELASTIC 4X5.8 VLCR STR LF (GAUZE/BANDAGES/DRESSINGS) ×3 IMPLANT
BNDG ELASTIC 6X5.8 VLCR STR LF (GAUZE/BANDAGES/DRESSINGS) ×3 IMPLANT
BNDG GAUZE ELAST 4 BULKY (GAUZE/BANDAGES/DRESSINGS) ×3 IMPLANT
CANISTER SUCT 3000ML PPV (MISCELLANEOUS) ×3 IMPLANT
CATH ROBINSON RED A/P 18FR (CATHETERS) ×6 IMPLANT
CATH THORACIC 28FR (CATHETERS) ×3 IMPLANT
CATH THORACIC 36FR (CATHETERS) ×3 IMPLANT
CATH THORACIC 36FR RT ANG (CATHETERS) ×3 IMPLANT
CLIP VESOCCLUDE MED 24/CT (CLIP) IMPLANT
CLIP VESOCCLUDE SM WIDE 24/CT (CLIP) ×6 IMPLANT
COVER MAYO STAND STRL (DRAPES) ×3 IMPLANT
DERMABOND ADVANCED (GAUZE/BANDAGES/DRESSINGS) ×1
DERMABOND ADVANCED .7 DNX12 (GAUZE/BANDAGES/DRESSINGS) ×2 IMPLANT
DRAPE CARDIOVASCULAR INCISE (DRAPES) ×1
DRAPE SLUSH/WARMER DISC (DRAPES) ×3 IMPLANT
DRAPE SRG 135X102X78XABS (DRAPES) ×2 IMPLANT
DRSG COVADERM 4X14 (GAUZE/BANDAGES/DRESSINGS) ×3 IMPLANT
ELECT CAUTERY BLADE 6.4 (BLADE) ×3 IMPLANT
ELECT REM PT RETURN 9FT ADLT (ELECTROSURGICAL) ×6
ELECTRODE REM PT RTRN 9FT ADLT (ELECTROSURGICAL) ×4 IMPLANT
FELT TEFLON 1X6 (MISCELLANEOUS) ×3 IMPLANT
GAUZE SPONGE 4X4 12PLY STRL (GAUZE/BANDAGES/DRESSINGS) ×6 IMPLANT
GAUZE SPONGE 4X4 12PLY STRL LF (GAUZE/BANDAGES/DRESSINGS) ×6 IMPLANT
GLOVE BIO SURGEON STRL SZ 6 (GLOVE) IMPLANT
GLOVE BIO SURGEON STRL SZ 6.5 (GLOVE) ×12 IMPLANT
GLOVE BIO SURGEON STRL SZ7 (GLOVE) IMPLANT
GLOVE BIO SURGEON STRL SZ7.5 (GLOVE) IMPLANT
GLOVE BIOGEL PI IND STRL 6 (GLOVE) IMPLANT
GLOVE BIOGEL PI IND STRL 6.5 (GLOVE) ×12 IMPLANT
GLOVE BIOGEL PI IND STRL 7.0 (GLOVE) IMPLANT
GLOVE BIOGEL PI INDICATOR 6 (GLOVE)
GLOVE BIOGEL PI INDICATOR 6.5 (GLOVE) ×6
GLOVE BIOGEL PI INDICATOR 7.0 (GLOVE)
GLOVE EXAM NITRILE MICROT MD (GLOVE) IMPLANT
GLOVE ORTHO TXT STRL SZ7.5 (GLOVE) IMPLANT
GLOVE TRIUMPH SURG SIZE 7.0 (KITS) ×6 IMPLANT
GOWN STRL REUS W/ TWL LRG LVL3 (GOWN DISPOSABLE) ×12 IMPLANT
GOWN STRL REUS W/ TWL XL LVL3 (GOWN DISPOSABLE) ×6 IMPLANT
GOWN STRL REUS W/TWL LRG LVL3 (GOWN DISPOSABLE) ×6
GOWN STRL REUS W/TWL XL LVL3 (GOWN DISPOSABLE) ×3
HEMOSTAT POWDER SURGIFOAM 1G (HEMOSTASIS) ×9 IMPLANT
HEMOSTAT SURGICEL 2X14 (HEMOSTASIS) ×3 IMPLANT
INSERT FOGARTY 61MM (MISCELLANEOUS) IMPLANT
INSERT FOGARTY XLG (MISCELLANEOUS) IMPLANT
KIT BASIN OR (CUSTOM PROCEDURE TRAY) ×3 IMPLANT
KIT CATH CPB BARTLE (MISCELLANEOUS) ×3 IMPLANT
KIT SUCTION CATH 14FR (SUCTIONS) ×3 IMPLANT
KIT TURNOVER KIT B (KITS) ×3 IMPLANT
KIT VASOVIEW HEMOPRO 2 VH 4000 (KITS) ×3 IMPLANT
MARKER SKIN DUAL TIP RULER LAB (MISCELLANEOUS) ×3 IMPLANT
NS IRRIG 1000ML POUR BTL (IV SOLUTION) ×15 IMPLANT
PACK E OPEN HEART (SUTURE) ×3 IMPLANT
PACK OPEN HEART (CUSTOM PROCEDURE TRAY) ×3 IMPLANT
PAD ARMBOARD 7.5X6 YLW CONV (MISCELLANEOUS) ×6 IMPLANT
PAD ELECT DEFIB RADIOL ZOLL (MISCELLANEOUS) ×3 IMPLANT
PENCIL BUTTON HOLSTER BLD 10FT (ELECTRODE) ×3 IMPLANT
POSITIONER HEAD DONUT 9IN (MISCELLANEOUS) ×3 IMPLANT
PUNCH AORTIC ROTATE 4.0MM (MISCELLANEOUS) IMPLANT
PUNCH AORTIC ROTATE 4.5MM 8IN (MISCELLANEOUS) ×3 IMPLANT
PUNCH AORTIC ROTATE 5MM 8IN (MISCELLANEOUS) IMPLANT
SET CARDIOPLEGIA MPS 5001102 (MISCELLANEOUS) ×3 IMPLANT
SPONGE INTESTINAL PEANUT (DISPOSABLE) IMPLANT
SPONGE LAP 18X18 RF (DISPOSABLE) IMPLANT
SPONGE LAP 18X18 X RAY DECT (DISPOSABLE) ×6 IMPLANT
SPONGE LAP 4X18 RFD (DISPOSABLE) IMPLANT
STAPLER VISISTAT 35W (STAPLE) ×3 IMPLANT
SUPPORT HEART JANKE-BARRON (MISCELLANEOUS) ×3 IMPLANT
SUT BONE WAX W31G (SUTURE) ×3 IMPLANT
SUT MNCRL AB 4-0 PS2 18 (SUTURE) ×6 IMPLANT
SUT PROLENE 3 0 SH DA (SUTURE) IMPLANT
SUT PROLENE 3 0 SH1 36 (SUTURE) ×3 IMPLANT
SUT PROLENE 4 0 RB 1 (SUTURE)
SUT PROLENE 4 0 SH DA (SUTURE) IMPLANT
SUT PROLENE 4-0 RB1 .5 CRCL 36 (SUTURE) IMPLANT
SUT PROLENE 5 0 C 1 36 (SUTURE) IMPLANT
SUT PROLENE 6 0 C 1 30 (SUTURE) IMPLANT
SUT PROLENE 7 0 BV 1 (SUTURE) IMPLANT
SUT PROLENE 7 0 BV1 MDA (SUTURE) ×6 IMPLANT
SUT PROLENE 8 0 BV175 6 (SUTURE) IMPLANT
SUT SILK  1 MH (SUTURE)
SUT SILK 1 MH (SUTURE) IMPLANT
SUT SILK 2 0 SH (SUTURE) IMPLANT
SUT SILK 2 0 SH CR/8 (SUTURE) ×3 IMPLANT
SUT STEEL STERNAL CCS#1 18IN (SUTURE) IMPLANT
SUT STEEL SZ 6 DBL 3X14 BALL (SUTURE) ×9 IMPLANT
SUT VIC AB 1 CTX 36 (SUTURE) ×2
SUT VIC AB 1 CTX36XBRD ANBCTR (SUTURE) ×4 IMPLANT
SUT VIC AB 2-0 CT1 27 (SUTURE) ×2
SUT VIC AB 2-0 CT1 TAPERPNT 27 (SUTURE) ×4 IMPLANT
SUT VIC AB 2-0 CTX 27 (SUTURE) IMPLANT
SUT VIC AB 3-0 SH 27 (SUTURE)
SUT VIC AB 3-0 SH 27X BRD (SUTURE) IMPLANT
SUT VIC AB 3-0 X1 27 (SUTURE) IMPLANT
SUT VICRYL 4-0 PS2 18IN ABS (SUTURE) IMPLANT
SYSTEM SAHARA CHEST DRAIN ATS (WOUND CARE) ×3 IMPLANT
TAPE CLOTH SURG 4X10 WHT LF (GAUZE/BANDAGES/DRESSINGS) ×3 IMPLANT
TAPE PAPER 2X10 WHT MICROPORE (GAUZE/BANDAGES/DRESSINGS) ×3 IMPLANT
TOWEL GREEN STERILE (TOWEL DISPOSABLE) ×3 IMPLANT
TOWEL GREEN STERILE FF (TOWEL DISPOSABLE) ×3 IMPLANT
TRAY FOLEY SLVR 16FR TEMP STAT (SET/KITS/TRAYS/PACK) ×3 IMPLANT
TUBING LAP HI FLOW INSUFFLATIO (TUBING) ×3 IMPLANT
UNDERPAD 30X36 HEAVY ABSORB (UNDERPADS AND DIAPERS) ×3 IMPLANT
WATER STERILE IRR 1000ML POUR (IV SOLUTION) ×6 IMPLANT

## 2019-08-05 NOTE — OR Nursing (Signed)
Twenty minute call to 2 Heart Charge Nurse at 1320. Spoke to [place]. Cath Lab also notified of timing.

## 2019-08-05 NOTE — Anesthesia Procedure Notes (Signed)
Procedure Name: Intubation Date/Time: 08/05/2019 8:00 AM Performed by: Lexi Conaty T, CRNA Pre-anesthesia Checklist: Patient identified, Emergency Drugs available, Suction available and Patient being monitored Patient Re-evaluated:Patient Re-evaluated prior to induction Oxygen Delivery Method: Circle system utilized Preoxygenation: Pre-oxygenation with 100% oxygen Induction Type: IV induction Ventilation: Mask ventilation without difficulty Laryngoscope Size: Miller and 3 Grade View: Grade I Tube type: Oral Tube size: 8.0 mm Number of attempts: 1 Airway Equipment and Method: Stylet and Oral airway Placement Confirmation: ETT inserted through vocal cords under direct vision,  positive ETCO2 and breath sounds checked- equal and bilateral Secured at: 22 cm Tube secured with: Tape Dental Injury: Teeth and Oropharynx as per pre-operative assessment

## 2019-08-05 NOTE — Progress Notes (Addendum)
Afib with RVR:  Patient got up for [place] to do morning cares and finish cleaning with CHG and clip and patient developed Afib with RVR around 0500.  Patient reported not symptoms at this time and BP's were 120's initially.  Paged cardiology and plan is to let the morning metoprolol try to help control the afib. EKG completed.  0550:  No changes in rhythm and patient is going in and out of afib and SR.  Cardiology would like to wait for a decision from TCTS on how to proceed.  At this time, does not report symptoms and BP's are stable.  Contacted anesthesia charge and the plan is to send him down to preop holding awaiting CABG this AM.  All preop checklist is complete and will give report upon entry to OR.

## 2019-08-05 NOTE — Discharge Summary (Signed)
Physician Discharge Summary       [address]       [place] [address]             [phone]    Patient ID: Tadeo Besecker MRN: [id number] DOB/AGE: 1956/02/05 64 y.o.  Admit date: 08/03/2019 Discharge date: 08/16/2019  Admission Diagnoses: 1. S/p NSTEMI (non-ST elevated myocardial infarction) (HCC) 2. Coronary artery disease-S/P percutaneous coronary angioplasty of 100% OM1 3. Ischemic cardiomyopathy  Discharge Diagnoses:  1. S/P CABG x 3 2.  Atrial fibrillation 3. History of essential hypertension 4. History of stroke 5. History of tobacco abuse (since age 63) 3. History of hyperlipidemia   7. History of depression     Consults: None  Procedure (s):  1. Median Sternotomy 2. Extracorporeal circulation 3.   Coronary artery bypass grafting x 3   Left internal mammary artery graft to the LAD  SVG to diagonal  SVG to OM  4.   Endoscopic vein harvest from the right leg by Dr. Laneta Simmers on 08/05/2019.  History of Presenting Illness: Patient is a 64 year old history of hypertension, hyperlipidemia, and ongoing heavy tobacco abuse who reports a 3-week history of frequent intermittent substernal chest pain that felt like an elephant sitting on his chest and lasted from 15 to 30 minutes.  These episodes have become more frequent and last night he developed prolonged chest pain that was 10 out of 10 and did not resolve.  He presented to [facility] emergency room and electrocardiogram showed minimal lateral ST elevation.  His troponin was elevated at 5.98.  He had continued chest pain was transferred to [facility] for cardiac catheterization which was performed today.  This showed severe three-vessel coronary disease.  The culprit lesion was an occluded large first marginal branch which was successfully opened with balloon angioplasty.  No stent was placed since the patient had severe three-vessel coronary disease and surgery was felt to be the best treatment.  There  is also a 90% ostial to proximal LAD stenosis.  There is a large first diagonal and 80% stenosis.  The right coronary artery was diffusely diseased with segmental 60% mid to distal vessel stenosis.  Left ventricular ejection fraction of 35 to 45% by visual estimate with a moderately elevated LVEDP of 21.  A 2D echocardiogram today showed an ejection fraction of 35 to 40% with global hypokinesis.  The left ventricle is mildly dilated.  There is grade 1 diastolic dysfunction.  RV function is normal.  There was mild mitral regurgitation and mild aortic insufficiency.  The patient is on disability due to multiple orthopedic surgeries in the past.  He lives at home with his wife and so that he still remains active mowing lawns.  He does report that over the last 3 weeks he has had intermittent chest discomfort associated with nausea, diaphoresis, and generalized weakness and shortness of breath.  Dr. Laneta Simmers agreed that coronary bypass graft surgery is the best long-term treatment for this patient with severe calcific three-vessel coronary disease with high-grade proximal LAD and diagonal stenosis. Dr. Laneta Simmers discussed the operative procedure with the patient including alternatives, benefits and risks and he agreed to proceed with surgery. Pre operative carotid duplex US showed no significant internal carotid artery stenosis bilaterally. He underwent a CABG x 3 on 08/05/2019.   Brief Hospital Course:  The patient was extubated the evening of surgery without difficulty. He remained afebrile and hemodynamically stable. He was weaned off Milrinone drip. Chest x ray showed opacity right  lung (edema, +/- pneumonia) and he had a cough so he was started on empiric Cefepime.  This was later transitioned to oral Augmentin.  Theone MurdochSwan Ganz, a line, chest tubes, and foley were removed early in the post operative course. He went into a fib with RVR and was put on an Amiodarone drip. His rate was not well controlled so he was  given IV Digoxin followed by oral Digoxin. He was transitioned to oral Amiodarone.  His Atrial Fibrillation persisted and he was started on coumadin for stroke prophylaxis.  His most recent INR is 1.6  He will be discharged home on 5 mg of Coumadin.  PT and INR appointment were made for 05/26. He required bi pap initially (has history of prolonged tobacco abuse and likely has underlying COPD).  He was weaned to HFNC then [place].  He will require home oxygen use, that has been arranged.  He was volume over loaded and diuresed. He had ABL anemia. He did not require a post op transfusion. Last H and H was 10.2/30.4 . He was weaned off the insulin drip. The patient's glucose remained well controlled.The patient's HGA1C pre op was 6. He likely has pre diabetes. He will need to follow up with his medical doctor after discharge and he will be provided nutritional information with his discharge paperwork. The patient was felt surgically stable for transfer from the ICU to [facility] for further convalescence on 05/17. Hecontinues to progress with cardiac rehab.He has been tolerating a diet and has had a bowel movement. Epicardial pacing wires were removed on 05/19.  Chest tube sutures will be removed in the office after discharge. As discussed with Dr. Laneta SimmersBartle, the patient is felt surgically stable for discharge today.   Latest Vital Signs: Blood pressure (!) 101/57, pulse (!) 56, temperature 97.8 F (36.6 C), temperature source Oral, resp. rate 14, height 5' 7.5" (1.715 m), weight 83.7 kg, SpO2 100 %.  Physical Exam: Cardiovascular: RRR Pulmonary: Slightly diminished bibasilar breath sounds Abdomen: Soft, non tender, bowel sounds present. Extremities:Trace bilateral lower extremity edema. Wounds: Clean and dry.  No erythema or signs of infection.  Discharge Condition: Stable and discharged to home.  Recent laboratory studies:  Lab Results  Component Value Date   WBC 13.3 (H) 08/15/2019   HGB 9.5 (L) 08/15/2019    HCT 28.9 (L) 08/15/2019   MCV 92.3 08/15/2019   PLT 524 (H) 08/15/2019   Lab Results  Component Value Date   NA 133 (L) 08/15/2019   K 3.9 08/15/2019   CL 86 (L) 08/15/2019   CO2 34 (H) 08/15/2019   CREATININE 1.08 08/15/2019   GLUCOSE 118 (H) 08/15/2019      Diagnostic Studies: DG Chest 2 View  Result Date: 08/15/2019 CLINICAL DATA:  Pneumonia EXAM: CHEST - 2 VIEW COMPARISON:  08/12/2019 FINDINGS: CABG. Improving aeration in the left lung with decreasing airspace disease. Patchy opacities noted bilaterally. Small left effusion. No acute bony abnormality. IMPRESSION: Improving left lung airspace disease. Continued patchy bilateral airspace opacities. Small left effusion. Electronically Signed   By: Charlett NoseKevin  Dover M.D.   On: 08/15/2019 07:14   DG Chest 2 View  Result Date: 08/11/2019 CLINICAL DATA:  Cough. EXAM: CHEST - 2 VIEW COMPARISON:  08/09/2019. FINDINGS: Prior CABG. Cardiomegaly again noted. Bilateral diffuse interstitial prominence again noted with slight improvement from prior exam. Findings suggest improving interstitial edema. Pneumonitis cannot be excluded. Bibasilar subsegmental atelectasis. Small bilateral pleural effusions. No pneumothorax. Prior cervical spine fusion. IMPRESSION: Prior CABG. Cardiomegaly again  noted. Diffuse bilateral interstitial prominence again noted with slight improvement from prior exam. Findings suggest slight improvement of interstitial edema. Mild bibasilar atelectasis. Small bilateral pleural effusions noted. Electronically Signed   By: Maisie Fus  Register   On: 08/11/2019 08:37   DG Chest 2 View  Result Date: 08/09/2019 CLINICAL DATA:  Shortness of breath and chest pain EXAM: CHEST - 2 VIEW COMPARISON:  08/08/2019 FINDINGS: Cardiac shadow is enlarged but stable. Postsurgical changes are again seen. Diffuse airspace opacities are again identified within both lungs slightly increased on the left when compared with the prior exam. No recurrent pneumothorax  is seen. Postsurgical changes in the cervical spine are noted. Right jugular sheath is been removed as well. Small effusions are noted posteriorly. IMPRESSION: Persistent and slightly increased airspace opacities bilaterally particularly in the left mid lung. Small effusions are noted posteriorly. No recurrent pneumothorax is noted. Electronically Signed   By: Alcide Clever M.D.   On: 08/09/2019 08:30   CARDIAC CATHETERIZATION  Result Date: 08/03/2019  Mid RCA to Dist RCA lesion is 60% stenosed.  1st Mrg lesion is 100% stenosed.  Post intervention, there is a 25% residual stenosis.  Balloon angioplasty was performed using a BALLOON SAPPHIRE 2.0X12.  Ost LAD to Prox LAD lesion is 90% stenosed.  1st Diag lesion is 80% stenosed.  There is moderate left ventricular systolic dysfunction.  The left ventricular ejection fraction is 35-45% by visual estimate.  LV end diastolic pressure is moderately elevated.  1.  Significant three-vessel coronary artery disease.  The culprit for myocardial infarction is an occluded OM1.  There is complex bifurcation proximal LAD/diagonal disease with heavy calcifications and extension all the way back to the ostial LAD.  There is also 60% diffuse disease in the mid to distal right coronary artery. 2.  Moderately reduced LV systolic function with an EF of 35% with global hypokinesis but more pronounced in the inferior apical area.  Moderately elevated left ventricular end-diastolic pressure at 22 mmHg. 3.  Successful balloon angioplasty of OM1 which establish TIMI-3 flow. Recommendations: I elected to treat the culprit OM1 occlusion in order to establish flow.  However, I did not place a stent to avoid the need for dual antiplatelet therapy.  The patient has complex calcified proximal LAD disease at the bifurcation of a large diagonal branch which is also disease.  This is best treated with CABG.  In addition, he has borderline diffuse disease in the mid to distal right coronary  artery. Recommend starting heparin drip 4 hours after sheath pull. I consulted CVTS for CABG. the patient was not given a P2 Y 12 inhibitor. An echocardiogram was ordered.   DG CHEST PORT 1 VIEW  Result Date: 08/12/2019 CLINICAL DATA:  Chest pain. EXAM: PORTABLE CHEST 1 VIEW COMPARISON:  Yesterday. FINDINGS: Interval patchy opacity in the left mid and lower lung zones with obscuration of the left heart borders. No gross change in mild enlargement of the cardiac silhouette with post CABG changes. The are mildly prominent with further improvement. No definite pleural fluid seen. Diffuse osteopenia. Cervical spine fixation hardware. IMPRESSION: 1. Interval probable pneumonia in the left mid and lower lung zones. 2. Further improved interstitial pulmonary edema with probable mild underlying chronic interstitial lung disease. 3. Stable mild cardiomegaly. Electronically Signed   By: Beckie Salts M.D.   On: 08/12/2019 09:18   DG Chest Port 1 View  Result Date: 08/08/2019 CLINICAL DATA:  Status post open heart surgery. EXAM: PORTABLE CHEST 1 VIEW COMPARISON:  Aug 07, 2019. FINDINGS: Stable cardiomegaly. Sternotomy wires are noted. No pneumothorax or pleural effusion is noted. Left-sided chest tube has been removed. Right internal jugular venous sheath is noted. Stable bilateral midlung opacities are noted concerning for edema or possibly inflammation. Bony thorax is unremarkable. IMPRESSION: No pneumothorax is noted status post left-sided chest tube removal. Stable bilateral midlung opacities are noted concerning for edema or possibly inflammation. Electronically Signed   By: Marijo Conception M.D.   On: 08/08/2019 08:07   DG Chest Port 1 View  Result Date: 08/07/2019 CLINICAL DATA:  Follow-up exam.  Atelectasis. EXAM: PORTABLE CHEST 1 VIEW COMPARISON:  08/06/2019. FINDINGS: Since the previous exam, hazy airspace opacity has increased in the right mid lung and left mid to lower lung, associated with mild stable  interstitial thickening. Additional opacity at the left lung base is stable consistent with atelectasis. Left lower hemithorax chest tube is unchanged. The Swan-Ganz catheter has been removed. The introducer sheath remains in place. No pneumothorax. No mediastinal widening. IMPRESSION: 1. Interval worsening lung aeration with new hazy airspace opacities developing in the right mid lung and left mid to lower lung. Opacities may reflect asymmetric edema. Multifocal infection or inflammation is possible but felt less likely. 2. Status post removal of the Swan-Ganz catheter. 3. No other change. Electronically Signed   By: Lajean Manes M.D.   On: 08/07/2019 09:29   DG Chest Port 1 View  Result Date: 08/06/2019 CLINICAL DATA:  STEMI EXAM: PORTABLE CHEST 1 VIEW COMPARISON:  Chest x-ray dated 08/05/2019. FINDINGS: Stable cardiomegaly. LEFT-sided chest tube has retracted slightly with tip now at the level of the LEFT hilum. Swan-Ganz catheter is stable in position with tip slightly to the LEFT of midline. Endotracheal tube has been removed. Enteric tube has been removed. Lungs are clear. No pneumothorax is seen. IMPRESSION: 1. LEFT-sided chest tube has retracted slightly with tip now at the level of the LEFT hilum. 2. Lungs are clear. No pneumothorax seen. Electronically Signed   By: Franki Cabot M.D.   On: 08/06/2019 09:58   DG Chest Port 1 View  Result Date: 08/05/2019 CLINICAL DATA:  S/P CABG x 3 EXAM: PORTABLE CHEST - 1 VIEW COMPARISON:  the previous day's study FINDINGS: Interval median sternotomy and CABG. Endotracheal tube tip approximately 5 cm above carina. Gastric tube placement with proximal side hole near the GE junction. Right IJ Swan-Ganz to the pulmonary outflow tract. Left chest tube with no pneumothorax. 2 mediastinal drains. Right suprahilar and left mid lung patchy airspace opacities. Heart size and mediastinal contours are within normal limits. No effusion. Cervical fixation hardware as before.  IMPRESSION: 1. Postop changes as above. 2. Proximal side hole of the gastric tube near the GE junction. Electronically Signed   By: Lucrezia Europe M.D.   On: 08/05/2019 14:36   DG Chest Port 1 View  Result Date: 08/04/2019 CLINICAL DATA:  Preoperative evaluation.  STEMI. EXAM: PORTABLE CHEST 1 VIEW COMPARISON:  08/03/2019 FINDINGS: The heart size and mediastinal contours are within normal limits. Both lungs are clear. The visualized skeletal structures are unremarkable. IMPRESSION: No active disease. Electronically Signed   By: Nelson Chimes M.D.   On: 08/04/2019 14:45   ECHOCARDIOGRAM COMPLETE  Result Date: 08/03/2019    ECHOCARDIOGRAM REPORT   Patient Name:   MATEO OVERBECK Date of Exam: 08/03/2019 Medical Rec #:  [id number]     Height:       67.5 in Accession #:    [id number]  Weight:       182.0 lb Date of Birth:  01/23/1956     BSA:          1.954 m Patient Age:    63 years      BP:           127/84 mmHg Patient Gender: M             HR:           59 bpm. Exam Location:  Inpatient Procedure: 2D Echo Indications:    chest pain  History:        Patient has no prior history of Echocardiogram examinations.                 CAD; Risk Factors:Current Smoker.  Sonographer:    Delcie Roch Referring Phys: [id number] CADENCE H FURTH IMPRESSIONS  1. Left ventricular ejection fraction, by estimation, is 35 to 40%. The left ventricle has moderately decreased function. The left ventricle demonstrates global hypokinesis. The left ventricular internal cavity size was mildly dilated. Left ventricular diastolic parameters are consistent with Grade I diastolic dysfunction (impaired relaxation). Elevated left atrial pressure.  2. Right ventricular systolic function is normal. The right ventricular size is normal. There is mildly elevated pulmonary artery systolic pressure. The estimated right ventricular systolic pressure is 38.8 mmHg.  3. Left atrial size was mildly dilated.  4. The mitral valve is normal in structure. Mild  mitral valve regurgitation. No evidence of mitral stenosis.  5. The aortic valve is normal in structure. Aortic valve regurgitation is mild. No aortic stenosis is present.  6. The inferior vena cava is normal in size with greater than 50% respiratory variability, suggesting right atrial pressure of 3 mmHg. FINDINGS  Left Ventricle: Left ventricular ejection fraction, by estimation, is 35 to 40%. The left ventricle has moderately decreased function. The left ventricle demonstrates global hypokinesis. The left ventricular internal cavity size was mildly dilated. There is no left ventricular hypertrophy. Left ventricular diastolic parameters are consistent with Grade I diastolic dysfunction (impaired relaxation). Elevated left atrial pressure.  LV Wall Scoring: The mid and distal lateral wall and basal anteroseptal segment are hypokinetic. Right Ventricle: The right ventricular size is normal. No increase in right ventricular wall thickness. Right ventricular systolic function is normal. There is mildly elevated pulmonary artery systolic pressure. The tricuspid regurgitant velocity is 2.99  m/s, and with an assumed right atrial pressure of 3 mmHg, the estimated right ventricular systolic pressure is 38.8 mmHg. Left Atrium: Left atrial size was mildly dilated. Right Atrium: Right atrial size was normal in size. Pericardium: There is no evidence of pericardial effusion. Mitral Valve: The mitral valve is normal in structure. Normal mobility of the mitral valve leaflets. Mild mitral valve regurgitation. No evidence of mitral valve stenosis. Tricuspid Valve: The tricuspid valve is normal in structure. Tricuspid valve regurgitation is mild . No evidence of tricuspid stenosis. Aortic Valve: The aortic valve is normal in structure. Aortic valve regurgitation is mild. Aortic regurgitation PHT measures 560 msec. No aortic stenosis is present. Pulmonic Valve: The pulmonic valve was normal in structure. Pulmonic valve  regurgitation is not visualized. No evidence of pulmonic stenosis. Aorta: The aortic root is normal in size and structure. Venous: The inferior vena cava is normal in size with greater than 50% respiratory variability, suggesting right atrial pressure of 3 mmHg. IAS/Shunts: No atrial level shunt detected by color flow Doppler.  LEFT VENTRICLE PLAX 2D LVIDd:  5.60 cm  Diastology LVIDs:         4.40 cm  LV e' lateral:   9.14 cm/s LV PW:         0.80 cm  LV E/e' lateral: 8.8 LV IVS:        0.80 cm  LV e' medial:    6.96 cm/s LVOT diam:     2.00 cm  LV E/e' medial:  11.5 LV SV:         74 LV SV Index:   38 LVOT Area:     3.14 cm  RIGHT VENTRICLE RV S prime:     13.50 cm/s TAPSE (M-mode): 2.0 cm LEFT ATRIUM             Index       RIGHT ATRIUM           Index LA diam:        4.20 cm 2.15 cm/m  RA Area:     16.00 cm LA Vol (A2C):   61.1 ml 31.28 ml/m RA Volume:   41.20 ml  21.09 ml/m LA Vol (A4C):   80.4 ml 41.16 ml/m LA Biplane Vol: 76.0 ml 38.90 ml/m  AORTIC VALVE LVOT Vmax:   101.00 cm/s LVOT Vmean:  63.300 cm/s LVOT VTI:    0.236 m AI PHT:      560 msec  AORTA Ao Root diam: 3.10 cm MITRAL VALVE               TRICUSPID VALVE MV Area (PHT): 2.39 cm    TR Peak grad:   35.8 mmHg MV Decel Time: 317 msec    TR Vmax:        299.00 cm/s MV E velocity: 80.10 cm/s MV A velocity: 81.80 cm/s  SHUNTS MV E/A ratio:  0.98        Systemic VTI:  0.24 m                            Systemic Diam: 2.00 cm Tobias Alexander MD Electronically signed by Tobias Alexander MD Signature Date/Time: 08/03/2019/7:03:47 PM    Final    ECHO INTRAOPERATIVE TEE  Result Date: 08/05/2019  *INTRAOPERATIVE TRANSESOPHAGEAL REPORT *  Patient Name:   COADY TRAIN Date of Exam: 08/05/2019 Medical Rec #:  [id number]     Height:       67.5 in Accession #:    [id number]    Weight:       178.6 lb Date of Birth:  19-Apr-1955     BSA:          1.94 m Patient Age:    63 years      BP:           91/71 mmHg Patient Gender: M             HR:           102  bpm. Exam Location:  Inpatient Transesophogeal exam was perform intraoperatively during surgical procedure. Patient was closely monitored under general anesthesia during the entirety of examination. Indications:     121-121.4 ST elevation (STEMI) and non-ST elevation (NSTEMI)                  myocardial infarction Performing Phys: [id number] BARTLE Diagnosing Phys: Achille Rich MD Complications: No known complications during this procedure. POST-OP IMPRESSIONS Overall, there were no significant changes from pre-bypass. PRE-OP FINDINGS  Left Ventricle: The left ventricle  has moderate-severely reduced systolic function, with an ejection fraction of 30-35%. The cavity size was normal. There is no increase in left ventricular wall thickness. Left ventrical global hypokinesis without regional wall motion abnormalities. Right Ventricle: The right ventricle has normal systolic function. The cavity was normal. There is no increase in right ventricular wall thickness. Left Atrium: Left atrial size was normal in size. There is echo contrast seen in the left atrial cavity and left atrial appendage. Pt came to OR in AF with RVR. Spontaneous echo contrast was seen during this period. There was conversion to NSR intra-operatively and SEC was no longer seen. The left atrial appendage is well visualized and there is no evidence of thrombus present. Left atrial appendage velocity is reduced at less than 40 cm/s (measured during AF). Right Atrium: Right atrial size was normal in size. Right atrial pressure is estimated at 10 mmHg. Interatrial Septum: No atrial level shunt detected by color flow Doppler. Pericardium: There is no evidence of pericardial effusion. Mitral Valve: The mitral valve is normal in structure. Mitral valve regurgitation is trivial by color flow Doppler. There is No evidence of mitral stenosis. Tricuspid Valve: The tricuspid valve was normal in structure. Tricuspid valve regurgitation is mild by color flow  Doppler. Aortic Valve: The aortic valve is tricuspid Aortic valve regurgitation is mild to moderate by color flow Doppler. The jet is anteriorly-directed. There is no evidence of aortic valve stenosis. PHT was which is consistent with mild-moderate AI. Pulmonic Valve: The pulmonic valve was normal in structure. Pulmonic valve regurgitation is trivial by color flow Doppler. Aorta: There is evidence of atheroma immobile plaque in the descending aorta; Grade I, measuring 1-53mm in size.  Achille Rich MD Electronically signed by Achille Rich MD Signature Date/Time: 08/05/2019/2:44:09 PM    Final    VAS US DOPPLER PRE CABG  Result Date: 08/04/2019 PREOPERATIVE VASCULAR EVALUATION  Risk Factors:     Hypertension, hyperlipidemia, current smoker, coronary artery                   disease. Comparison Study: No prior study on file Performing Technologist: Sherren Kerns RVS  Examination Guidelines: A complete evaluation includes B-mode imaging, spectral Doppler, color Doppler, and power Doppler as needed of all accessible portions of each vessel. Bilateral testing is considered an integral part of a complete examination. Limited examinations for reoccurring indications may be performed as noted.  Right Carotid Findings: +----------+--------+--------+--------+------------+------------------+           PSV cm/sEDV cm/sStenosisDescribe    Comments           +----------+--------+--------+--------+------------+------------------+ CCA Prox  107     11                          intimal thickening +----------+--------+--------+--------+------------+------------------+ CCA Distal120     16                          intimal thickening +----------+--------+--------+--------+------------+------------------+ ICA Prox  92      27              heterogenous                   +----------+--------+--------+--------+------------+------------------+ ICA Distal85      15                                              +----------+--------+--------+--------+------------+------------------+  ECA       100     2                                              +----------+--------+--------+--------+------------+------------------+ Portions of this table do not appear on this page. +----------+--------+-------+--------+------------+           PSV cm/sEDV cmsDescribeArm Pressure +----------+--------+-------+--------+------------+ Subclavian85                                  +----------+--------+-------+--------+------------+ +---------+--------+--+--------+--+ VertebralPSV cm/s57EDV cm/s11 +---------+--------+--+--------+--+ Left Carotid Findings: +----------+--------+--------+--------+------------+------------------+           PSV cm/sEDV cm/sStenosisDescribe    Comments           +----------+--------+--------+--------+------------+------------------+ CCA Prox  141     14                          intimal thickening +----------+--------+--------+--------+------------+------------------+ CCA Distal209     23                          intimal thickening +----------+--------+--------+--------+------------+------------------+ ICA Prox  74      17              heterogenous                   +----------+--------+--------+--------+------------+------------------+ ICA Distal42      14                                             +----------+--------+--------+--------+------------+------------------+ ECA       196     11                                             +----------+--------+--------+--------+------------+------------------+ +----------+--------+--------+--------+------------+ SubclavianPSV cm/sEDV cm/sDescribeArm Pressure +----------+--------+--------+--------+------------+           146                                  +----------+--------+--------+--------+------------+ +---------+--------+--+--------+-+ VertebralPSV cm/s38EDV cm/s5  +---------+--------+--+--------+-+  ABI Findings: +--------+------------------+-----+---------+--------+ Right   Rt Pressure (mmHg)IndexWaveform Comment  +--------+------------------+-----+---------+--------+ Brachial                       triphasic         +--------+------------------+-----+---------+--------+ PTA                            biphasic          +--------+------------------+-----+---------+--------+ DP                             biphasic          +--------+------------------+-----+---------+--------+ +--------+------------------+-----+---------+-------+ Left    Lt Pressure (mmHg)IndexWaveform Comment +--------+------------------+-----+---------+-------+ [id number]                    triphasic        +--------+------------------+-----+---------+-------+  PTA                            triphasic        +--------+------------------+-----+---------+-------+ DP                             triphasic        +--------+------------------+-----+---------+-------+  Right Doppler Findings: +--------+--------+-----+---------+--------+ Site    PressureIndexDoppler  Comments +--------+--------+-----+---------+--------+ Brachial             triphasic         +--------+--------+-----+---------+--------+ Radial               triphasic         +--------+--------+-----+---------+--------+ Ulnar                triphasic         +--------+--------+-----+---------+--------+  Left Doppler Findings: +--------+--------+-----+---------+--------+ Site    PressureIndexDoppler  Comments +--------+--------+-----+---------+--------+ [id number]          triphasic         +--------+--------+-----+---------+--------+ Radial               triphasic         +--------+--------+-----+---------+--------+ Ulnar                triphasic         +--------+--------+-----+---------+--------+  Summary: Right Carotid: The extracranial vessels were  near-normal with only minimal wall                thickening or plaque. Left Carotid: The extracranial vessels were near-normal with only minimal wall               thickening or plaque. Vertebrals:  Bilateral vertebral arteries demonstrate antegrade flow. Subclavians: Normal flow hemodynamics were seen in bilateral subclavian              arteries. Right Upper Extremity: Doppler waveform obliterate with right radial compression. Doppler waveforms remain within normal limits with right ulnar compression. Left Upper Extremity: Doppler waveforms decrease >50% with left radial compression. Doppler waveforms remain within normal limits with left ulnar compression.  Electronically signed by Lemar Livings MD on 08/04/2019 at 5:40:34 PM.    Final    Discharge Instructions    Amb Referral to Cardiac Rehabilitation   Complete by: As directed    Diagnosis:  CABG PTCA NSTEMI     CABG X ___: 3   After initial evaluation and assessments completed: Virtual Based Care may be provided alone or in conjunction with Phase 2 Cardiac Rehab based on patient barriers.: Yes      Discharge Medications: Allergies as of 08/16/2019   No Known Allergies     Medication List    TAKE these medications   acetaminophen 325 MG tablet Commonly known as: TYLENOL Take 2 tablets (650 mg total) by mouth every 6 (Yazeed Pryer) hours as needed for mild pain.   amiodarone 200 MG tablet Commonly known as: PACERONE Take 1 tablet (200 mg total) by mouth daily.   amoxicillin-clavulanate 500-125 MG tablet Commonly known as: AUGMENTIN Take 1 tablet (500 mg total) by mouth 3 (three) times daily.   aspirin 81 MG EC tablet Take 1 tablet (81 mg total) by mouth daily.   atorvastatin 80 MG tablet Commonly known as: LIPITOR Take 1 tablet (80 mg total) by mouth daily at 6 PM.   digoxin 0.125 MG tablet Commonly known as:  LANOXIN Take 1 tablet (0.125 mg total) by mouth daily.   furosemide 40 MG tablet Commonly known as: LASIX Take 1 tablet  (40 mg total) by mouth daily. For 5 days then stop.   levothyroxine 100 MCG tablet Commonly known as: SYNTHROID Take 1 tablet (100 mcg total) by mouth daily at 6 (Bevan Disney) AM. Start taking on: Aug 17, 2019   metoprolol tartrate 25 MG tablet Commonly known as: LOPRESSOR Take 0.5 tablets (12.5 mg total) by mouth 2 (two) times daily.   oxyCODONE 5 MG immediate release tablet Commonly known as: Oxy IR/ROXICODONE Take 1 tablet (5 mg total) by mouth every 6 (Kateryn Marasigan) hours as needed for severe pain.   potassium chloride SA 20 MEQ tablet Commonly known as: KLOR-CON Take 1 tablet (20 mEq total) by mouth daily. For 5 days then stop.   warfarin 5 MG tablet Commonly known as: COUMADIN Take 1 tablet (5 mg total) by mouth daily at 4 PM. Or as directed            Durable Medical Equipment  (From admission, onward)         Start     Ordered   08/10/19 0954  For home use only DME 3 n 1  Once     08/10/19 0953   08/10/19 0851  For home use only DME Walker rolling  Once    Question Answer Comment  Walker: With 5 Inch Wheels   Patient needs a walker to treat with the following condition Weakness      08/10/19 0850   08/10/19 0819  For home use only DME oxygen  Once    Question Answer Comment  Length of Need 6 Months   Mode or (Route) Nasal cannula   Liters per Minute 3   Frequency Continuous (stationary and portable oxygen unit needed)   Oxygen delivery system Gas      08/10/19 0818         The patient has been discharged on:   1.Beta Blocker:  Yes [  x ]                              No   [   ]                              If No, reason:  2.Ace Inhibitor/ARB: Yes [   ]                                     No  [   x ]                                     If No, reason:Labile BP;hope to start as outpatient  3.Statin:   Yes [  x ]                  No  [   ]                  If No, reason:  4.Ecasa:  Yes  [  x ]                  No   [   ]  If No, reason:   Follow Up Appointments: Follow-up Information    Alleen Borne, MD. Go on 09/07/2019.   Specialty: Cardiothoracic Surgery Why: PA/LAT CXR to be taken (at [facility] which is in the same building as Dr. Sharee Pimple office on 06/30 at 3:00 pm;Appointment time is at 3:30 pm Contact information: [address] [place] [address] [phone]        [facility] [place]. Go on 08/19/2019.   Specialty: Cardiothoracic Surgery Why: Appointment is with nurse only to have chest tube sutures removed. Appointment time is at 11:00 am Contact information: [address] [place], [address] [place] [address] [phone]       Revankar, Aundra Dubin, MD. Go on 08/23/2019.   Specialty: Cardiology Why: Appointment time is at 9:00 am. Also, please follow up on pre op HGA1C 6 Contact information: [address] [place] [address] [phone]        [facility] Follow up.   Why: Rolling walker, 3n1 arranged- to be delivered to room, home 02 referral-  Contact information: [address] [place] [address] [phone]        [facility] Follow up.   Specialty: Home Health Services Why: HHRN arranged- they will contact you to set up home visits Contact information: [address] [place] [address] [phone]        [facility] [address] Office. Go on 08/17/2019.   Specialty: Cardiology Why: Appointment is for PT and INR to be drawn (on Coumadin for a fib) and appointment time is at 2:30 pm Contact information: [address], [address] [place] [address] [phone]          Signed: Lelon Huh [facility] 08/16/2019, 4:03 PM

## 2019-08-05 NOTE — Progress Notes (Signed)
[facility] BRIEF SICU PROGRESS NOTE  Day of Surgery  S/P Procedure(s) (LRB): CORONARY ARTERY BYPASS GRAFTING (CABG), ON PUMP, TIMES THREE, USING LEFT INTERNAL MAMMARY ARTERY AND ENDOSCOPICALLY HARVESTED LEFT GREATER SAPHENOUS VEIN (N/A) TRANSESOPHAGEAL ECHOCARDIOGRAM (TEE) (N/A)   Extubated uneventfully Aflutter w/ HR 120 stable hemodynamics off Epi on Milrinone and Neo Breathing comfortably w/ O2 sats 96-98% Chest tube output low UOP adequate Labs OK  Plan: Continue current plan.  IV amiodarone.  Purcell Nails, MD 08/05/2019 6:53 PM

## 2019-08-05 NOTE — Anesthesia Preprocedure Evaluation (Signed)
Anesthesia Evaluation  Patient identified by MRN, date of birth, ID band Patient awake    Reviewed: Allergy & Precautions, H&P , NPO status , Patient's Chart, lab work & pertinent test results  Airway Mallampati: II   Neck ROM: full    Dental   Pulmonary Current Smoker,    breath sounds clear to auscultation       Cardiovascular hypertension, + angina + CAD and + Past MI   Rhythm:regular Rate:Normal  EF 35-40%. Mild AI, mild MR.   Neuro/Psych PSYCHIATRIC DISORDERS Depression    GI/Hepatic   Endo/Other    Renal/GU      Musculoskeletal   Abdominal   Peds  Hematology   Anesthesia Other Findings   Reproductive/Obstetrics                             Anesthesia Physical Anesthesia Plan  ASA: III  Anesthesia Plan: General   Post-op Pain Management:    Induction: Intravenous  PONV Risk Score and Plan: 1 and Ondansetron, Dexamethasone, Midazolam and Treatment may vary due to age or medical condition  Airway Management Planned: Oral ETT  Additional Equipment: Arterial line, CVP, PA Cath, TEE and Ultrasound Guidance Line Placement  Intra-op Plan:   Post-operative Plan: Post-operative intubation/ventilation  Informed Consent: I have reviewed the patients History and Physical, chart, labs and discussed the procedure including the risks, benefits and alternatives for the proposed anesthesia with the patient or authorized representative who has indicated his/her understanding and acceptance.       Plan Discussed with: CRNA, Anesthesiologist and Surgeon  Anesthesia Plan Comments:         Anesthesia Quick Evaluation

## 2019-08-05 NOTE — Anesthesia Postprocedure Evaluation (Signed)
Anesthesia Post Note  Patient: Matthew [name]  Procedure(s) Performed: CORONARY ARTERY BYPASS GRAFTING (CABG), ON PUMP, TIMES THREE, USING LEFT INTERNAL MAMMARY ARTERY AND ENDOSCOPICALLY HARVESTED LEFT GREATER SAPHENOUS VEIN (N/A Chest) TRANSESOPHAGEAL ECHOCARDIOGRAM (TEE) (N/A )     Patient location during evaluation: SICU Anesthesia Type: General Level of consciousness: sedated Pain management: pain level controlled Vital Signs Assessment: post-procedure vital signs reviewed and stable Respiratory status: patient remains intubated per anesthesia plan Cardiovascular status: stable Postop Assessment: no apparent nausea or vomiting Anesthetic complications: no    Last Vitals:  Vitals:   08/05/19 1408 08/05/19 1455  BP: (!) 188/75   Pulse: 91 64  Resp: 13 17  Temp:  (!) 36.3 C  SpO2: 94% 97%    Last Pain:  Vitals:   08/05/19 0505  TempSrc: Oral  PainSc:                  Dimitriy Carreras S

## 2019-08-05 NOTE — Anesthesia Procedure Notes (Signed)
Arterial Line Insertion Start/End5/14/2021 7:20 AM, 08/05/2019 7:30 AM Performed by: Tamyka Bezio T, Scientist, clinical (histocompatibility and immunogenetics), CRNA  Patient location: Pre-op. Preanesthetic checklist: patient identified, IV checked, risks and benefits discussed, surgical consent, monitors and equipment checked, pre-op evaluation, timeout performed and anesthesia consent Lidocaine 1% used for infiltration and patient sedated Left, radial was placed Catheter size: 20 G Hand hygiene performed  and maximum sterile barriers used   Attempts: 1 Procedure performed without using ultrasound guided technique. Following insertion, dressing applied and Biopatch. Post procedure assessment: normal  Patient tolerated the procedure well with no immediate complications.

## 2019-08-05 NOTE — Brief Op Note (Addendum)
08/03/2019 - 08/05/2019  11:41 AM  PATIENT:  Matthew [name]  64 y.o. male  PRE-OPERATIVE DIAGNOSIS:  1. S/p NSTEMI 2CORONARY ARTERY DISEASE  POST-OPERATIVE DIAGNOSIS:  1. S/p NSTEMI 2.CORONARY ARTERY DISEASE  PROCEDURE:  TRANSESOPHAGEAL ECHOCARDIOGRAM (TEE), CORONARY ARTERY BYPASS GRAFTING (CABG) x 3 (LIMA to LAD, SVG to DIAGONAL 1, SVG to OM1)  USING LEFT INTERNAL MAMMARY ARTERY AND ENDOSCOPICALLY HARVESTED LEFT GREATER SAPHENOUS VEIN   SURGEON:  Surgeon(s) and Role:    Alleen Borne, MD - Primary  PHYSICIAN ASSISTANT: Doree Fudge PA-C  ANESTHESIA:   general  EBL:  Per anesthesia and perfusion record  DRAINS: Chest tubes placed in the mediastinal and pleural spaces   COUNTS CORRECT:  YES  DICTATION: .Dragon Dictation  PLAN OF CARE: Admit to inpatient   PATIENT DISPOSITION:  ICU - intubated and hemodynamically stable.   Delay start of Pharmacological VTE agent (>24hrs) due to surgical blood loss or risk of bleeding: yes  BASELINE WEIGHT: 81 kg

## 2019-08-05 NOTE — Anesthesia Procedure Notes (Signed)
Central Venous Catheter Insertion Performed by: Shelton Silvas, MD, anesthesiologist Start/End5/14/2021 7:20 AM, 08/05/2019 7:25 AM Patient location: Pre-op. Preanesthetic checklist: patient identified, IV checked, site marked, risks and benefits discussed, surgical consent, monitors and equipment checked, pre-op evaluation, timeout performed and anesthesia consent Hand hygiene performed  and maximum sterile barriers used  PA cath was placed.Swan type:thermodilution Procedure performed without using ultrasound guided technique. Attempts: 1 Patient tolerated the procedure well with no immediate complications.

## 2019-08-05 NOTE — Procedures (Signed)
Extubation Procedure Note  Patient Details:   Name: Matthew [name] DOB: 1955-04-04 MRN: [id number]   Airway Documentation:    Vent end date: 08/05/19 Vent end time: 1734   Evaluation  O2 sats: stable throughout Complications: No apparent complications Patient did tolerate procedure well. Bilateral Breath Sounds: Diminished    Pt extubated to 5L Matthew [name] per rapid wean protocol. NIF -20 and VC was 500. Dr Laneta Simmers was called and informed of VC. Cuff leak was positive and no stridor noted.  Guss Bunde 08/05/2019, 5:50 PM

## 2019-08-05 NOTE — Anesthesia Procedure Notes (Signed)
Central Venous Catheter Insertion Performed by: Shelton Silvas, MD, anesthesiologist Start/End5/14/2021 7:10 AM, 08/05/2019 7:20 AM Patient location: Pre-op. Preanesthetic checklist: patient identified, IV checked, site marked, risks and benefits discussed, surgical consent, monitors and equipment checked, pre-op evaluation, timeout performed and anesthesia consent Position: Trendelenburg Lidocaine 1% used for infiltration and patient sedated Hand hygiene performed , maximum sterile barriers used  and Seldinger technique used Catheter size: 8.5 Fr Total catheter length 10. Central line was placed.Sheath introducer Swan type:thermodilution PA Cath depth:50 Procedure performed using ultrasound guided technique. Ultrasound Notes:anatomy identified, needle tip was noted to be adjacent to the nerve/plexus identified, no ultrasound evidence of intravascular and/or intraneural injection and image(s) printed for medical record Attempts: 1 Following insertion, line sutured and dressing applied. Post procedure assessment: blood return through all ports, free fluid flow and no air  Patient tolerated the procedure well with no immediate complications.

## 2019-08-05 NOTE — Transfer of Care (Signed)
Immediate Anesthesia Transfer of Care Note  Patient: Matthew [name]  Procedure(s) Performed: CORONARY ARTERY BYPASS GRAFTING (CABG), ON PUMP, TIMES THREE, USING LEFT INTERNAL MAMMARY ARTERY AND ENDOSCOPICALLY HARVESTED LEFT GREATER SAPHENOUS VEIN (N/A Chest) TRANSESOPHAGEAL ECHOCARDIOGRAM (TEE) (N/A )  Patient Location: ICU  Anesthesia Type:General  Level of Consciousness: Patient remains intubated per anesthesia plan  Airway & Oxygen Therapy: Patient remains intubated per anesthesia plan and Patient placed on Ventilator (see vital sign flow sheet for setting)  Post-op Assessment: Report given to RN and Post -op Vital signs reviewed and stable  Post vital signs: Reviewed and unstable  Last Vitals:  Vitals Value Taken Time  BP 118/76 08/05/19 1415  Temp 36.3 C 08/05/19 1416  Pulse 80 08/05/19 1416  Resp 17 08/05/19 1416  SpO2 95 % 08/05/19 1416  Vitals shown include unvalidated device data.  Last Pain:  Vitals:   08/05/19 0505  TempSrc: Oral  PainSc:          Complications: No apparent anesthesia complications

## 2019-08-05 NOTE — Progress Notes (Signed)
  Echocardiogram Echocardiogram Transesophageal has been performed.  Janalyn Harder 08/05/2019, 9:27 AM

## 2019-08-05 NOTE — Op Note (Signed)
CARDIOVASCULAR SURGERY OPERATIVE NOTE  08/05/2019  Surgeon:  Alleen Borne, MD  First Assistant: Doree Fudge,  PA-C   Preoperative Diagnosis:  Severe multi-vessel coronary artery disease   Postoperative Diagnosis:  Same   Procedure:  1. Median Sternotomy 2. Extracorporeal circulation 3.   Coronary artery bypass grafting x 3   Left internal mammary artery graft to the LAD  SVG to diagonal  SVG to OM  4.   Endoscopic vein harvest from the right leg   Anesthesia:  General Endotracheal   Clinical History/Surgical Indication:  This 64 year old gentleman has severe three-vessel coronary disease and moderate left ventricular systolic ith PTCA although it was likely closed for an extended period since his continuous pain started last night and his catheterization was done earlier this afternoon.  I agree that coronary bypass graft surgery is the best long-term treatment for this patient with severe calcific three-vessel coronary disease with high-grade proximal LAD and diagonal stenosis. I discussed the operative procedure with the patient including alternatives, benefits and risks; including but not limited to bleeding, blood transfusion, infection, stroke, myocardial infarction, graft failure, heart block requiring a permanent pacemaker, organ dysfunction, and death.  Marcheta Grammes understands and agrees to proceed.     Preparation:  The patient was seen in the preoperative holding area and the correct patient, correct operation were confirmed with the patient after reviewing the medical record and catheterization. The consent was signed by me. Preoperative antibiotics were given. A pulmonary arterial line and radial arterial line were placed by the anesthesia team. The patient was taken back to the operating room and positioned supine on the operating room table. After being placed  under general endotracheal anesthesia by the anesthesia team a foley catheter was placed. The neck, chest, abdomen, and both legs were prepped with betadine soap and solution and draped in the usual sterile manner. A surgical time-out was taken and the correct patient and operative procedure were confirmed with the nursing and anesthesia staff.   Cardiopulmonary Bypass:  A median sternotomy was performed. The pericardium was opened in the midline. Right ventricular function appeared normal. The ascending aorta was of normal size and had no palpable plaque. There were no contraindications to aortic cannulation or cross-clamping. The patient was fully systemically heparinized and the ACT was maintained > 400 sec. The proximal aortic arch was cannulated with a 20 F aortic cannula for arterial inflow. Venous cannulation was performed via the right atrial appendage using a two-staged venous cannula. An antegrade cardioplegia/vent cannula was inserted into the mid-ascending aorta. Aortic occlusion was performed with a single cross-clamp. Systemic cooling to 32 degrees Centigrade and topical cooling of the heart with iced saline were used. Hyperkalemic antegrade cold blood cardioplegia was used to induce diastolic arrest and was then given at about 20 minute intervals throughout the period of arrest to maintain myocardial temperature at or below 10 degrees centigrade. A temperature probe was inserted into the interventricular septum and an insulating pad was placed in the pericardium.   Left internal mammary artery harvest:  The left side of the sternum was retracted using the Rultract retractor. The left internal mammary artery was harvested as a pedicle graft. All side branches were clipped. It was a medium-sized vessel of good quality with excellent blood flow. It was ligated distally and divided. It was sprayed with topical papaverine solution to prevent vasospasm.   Endoscopic vein harvest:  The left  greater saphenous vein was harvested endoscopically through a 2 cm incision medial  to the left knee. It was harvested from the upper thigh to below the knee. It was a medium-sized vein of good quality. The side branches were all ligated with 4-0 silk ties.    Coronary arteries:  The coronary arteries were examined.   LAD:  Intramyocardial in proximal and middle portion but visible distally where it was a medium caliber vessel with no disease. Diagonal was a medium caliber vessel with no distal disease.   LCX:  OM was large but diffusely diseased extending out to the distal bifurcation. The lateral sub-branch was adequate for grafting.  RCA:  Diffusely diseased with calcific plaque. Since it only had 60% narrowing I decided not to graft it.   Grafts:  1. LIMA to the LAD: 2.0 mm. It was sewn end to side using 8-0 prolene continuous suture. 2. SVG to diagonal:  1.75 mm. It was sewn end to side using 7-0 prolene continuous suture. 3. SVG to OM:  1.6 mm. It was sewn end to side using 7-0 prolene continuous suture.   The proximal vein graft anastomoses were performed to the mid-ascending aorta using continuous 6-0 prolene suture. Graft markers were placed around the proximal anastomoses.   Completion:  The patient was rewarmed to 37 degrees Centigrade. The clamp was removed from the LIMA pedicle and there was rapid warming of the septum and return of ventricular fibrillation. The crossclamp was removed with a time of 68 minutes. There was spontaneous return of sinus rhythm. The distal and proximal anastomoses were checked for hemostasis. The position of the grafts was satisfactory. Two temporary epicardial pacing wires were placed on the right atrium and two on the right ventricle. The patient was weaned from CPB without difficulty on milrinone 0.25 and epi 2 mcg. CPB time was 88 minutes. Cardiac output was 5 LPM. TEE showed unchanged LV systolic function. Heparin was fully reversed with  protamine and the aortic and venous cannulas removed. Hemostasis was achieved. Mediastinal and left pleural drainage tubes were placed. The sternum was closed with double #6 stainless steel wires. The fascia was closed with continuous # 1 vicryl suture. The subcutaneous tissue was closed with 2-0 vicryl continuous suture. The skin was closed with 3-0 vicryl subcuticular suture. All sponge, needle, and instrument counts were reported correct at the end of the case. Dry sterile dressings were placed over the incisions and around the chest tubes which were connected to pleurevac suction. The patient was then transported to the surgical intensive care unit in stable condition.

## 2019-08-05 NOTE — OR Nursing (Signed)
Forty-five minute call to 2 Heart Charge nurse at 1225. Spoke to [place].

## 2019-08-06 ENCOUNTER — Inpatient Hospital Stay (HOSPITAL_COMMUNITY): Payer: Medicare Other

## 2019-08-06 LAB — GLUCOSE, CAPILLARY
Glucose-Capillary: 110 mg/dL — ABNORMAL HIGH (ref 70–99)
Glucose-Capillary: 118 mg/dL — ABNORMAL HIGH (ref 70–99)
Glucose-Capillary: 120 mg/dL — ABNORMAL HIGH (ref 70–99)
Glucose-Capillary: 122 mg/dL — ABNORMAL HIGH (ref 70–99)
Glucose-Capillary: 123 mg/dL — ABNORMAL HIGH (ref 70–99)
Glucose-Capillary: 123 mg/dL — ABNORMAL HIGH (ref 70–99)
Glucose-Capillary: 123 mg/dL — ABNORMAL HIGH (ref 70–99)
Glucose-Capillary: 126 mg/dL — ABNORMAL HIGH (ref 70–99)
Glucose-Capillary: 129 mg/dL — ABNORMAL HIGH (ref 70–99)
Glucose-Capillary: 130 mg/dL — ABNORMAL HIGH (ref 70–99)
Glucose-Capillary: 131 mg/dL — ABNORMAL HIGH (ref 70–99)
Glucose-Capillary: 152 mg/dL — ABNORMAL HIGH (ref 70–99)

## 2019-08-06 LAB — CBC
HCT: 33.5 % — ABNORMAL LOW (ref 39.0–52.0)
HCT: 33.5 % — ABNORMAL LOW (ref 39.0–52.0)
Hemoglobin: 11 g/dL — ABNORMAL LOW (ref 13.0–17.0)
Hemoglobin: 11.1 g/dL — ABNORMAL LOW (ref 13.0–17.0)
MCH: 31.2 pg (ref 26.0–34.0)
MCH: 30.7 pg (ref 26.0–34.0)
MCHC: 32.8 g/dL (ref 30.0–36.0)
MCHC: 33.1 g/dL (ref 30.0–36.0)
MCV: 94.9 fL (ref 80.0–100.0)
MCV: 92.5 fL (ref 80.0–100.0)
Platelets: 176 10*3/uL (ref 150–400)
Platelets: 185 10*3/uL (ref 150–400)
RBC: 3.53 MIL/uL — ABNORMAL LOW (ref 4.22–5.81)
RBC: 3.62 MIL/uL — ABNORMAL LOW (ref 4.22–5.81)
RDW: 13.2 % (ref 11.5–15.5)
RDW: 13.3 % (ref 11.5–15.5)
WBC: 12.1 10*3/uL — ABNORMAL HIGH (ref 4.0–10.5)
WBC: 14.2 10*3/uL — ABNORMAL HIGH (ref 4.0–10.5)
nRBC: 0 % (ref 0.0–0.2)
nRBC: 0 % (ref 0.0–0.2)

## 2019-08-06 LAB — MAGNESIUM
Magnesium: 2.3 mg/dL (ref 1.7–2.4)
Magnesium: 2.3 mg/dL (ref 1.7–2.4)

## 2019-08-06 LAB — BASIC METABOLIC PANEL
Anion gap: 10 (ref 5–15)
Anion gap: 12 (ref 5–15)
BUN: 11 mg/dL (ref 8–23)
BUN: 15 mg/dL (ref 8–23)
CO2: 21 mmol/L — ABNORMAL LOW (ref 22–32)
CO2: 20 mmol/L — ABNORMAL LOW (ref 22–32)
Calcium: 8.1 mg/dL — ABNORMAL LOW (ref 8.9–10.3)
Calcium: 8.4 mg/dL — ABNORMAL LOW (ref 8.9–10.3)
Chloride: 105 mmol/L (ref 98–111)
Chloride: 103 mmol/L (ref 98–111)
Creatinine, Ser: 1.28 mg/dL — ABNORMAL HIGH (ref 0.61–1.24)
Creatinine, Ser: 1.65 mg/dL — ABNORMAL HIGH (ref 0.61–1.24)
GFR calc Af Amer: >60 mL/min (ref >60)
GFR calc Af Amer: 51 mL/min — ABNORMAL LOW (ref >60)
GFR calc non Af Amer: 60 mL/min — ABNORMAL LOW (ref >60)
GFR calc non Af Amer: 44 mL/min — ABNORMAL LOW (ref >60)
Glucose, Bld: 121 mg/dL — ABNORMAL HIGH (ref 70–99)
Glucose, Bld: 131 mg/dL — ABNORMAL HIGH (ref 70–99)
Potassium: 4.5 mmol/L (ref 3.5–5.1)
Potassium: 4.5 mmol/L (ref 3.5–5.1)
Sodium: 136 mmol/L (ref 135–145)
Sodium: 135 mmol/L (ref 135–145)

## 2019-08-06 MED ORDER — FUROSEMIDE 10 MG/ML IJ SOLN
40.0000 mg | Freq: Once | INTRAMUSCULAR | Status: AC
  Administered 2019-08-06: 40 mg via INTRAVENOUS
  Filled 2019-08-06: qty 4

## 2019-08-06 MED ORDER — AMIODARONE LOAD VIA INFUSION
150.0000 mg | Freq: Once | INTRAVENOUS | Status: AC
  Administered 2019-08-06: 150 mg via INTRAVENOUS
  Filled 2019-08-06: qty 83.34

## 2019-08-06 MED ORDER — INSULIN ASPART 100 UNIT/ML ~~LOC~~ SOLN
0.0000 [IU] | SUBCUTANEOUS | Status: DC
  Administered 2019-08-06: 2 [IU] via SUBCUTANEOUS

## 2019-08-06 MED ORDER — ENOXAPARIN SODIUM 40 MG/0.4ML ~~LOC~~ SOLN
40.0000 mg | Freq: Every day | SUBCUTANEOUS | Status: DC
  Administered 2019-08-06 – 2019-08-14 (×9): 40 mg via SUBCUTANEOUS
  Filled 2019-08-06 (×9): qty 0.4

## 2019-08-06 NOTE — Progress Notes (Addendum)
TCTS BRIEF SICU PROGRESS NOTE  1 Day Post-Op  S/P Procedure(s) (LRB): CORONARY ARTERY BYPASS GRAFTING (CABG), ON PUMP, TIMES THREE, USING LEFT INTERNAL MAMMARY ARTERY AND ENDOSCOPICALLY HARVESTED LEFT GREATER SAPHENOUS VEIN (N/A) TRANSESOPHAGEAL ECHOCARDIOGRAM (TEE) (N/A)   Stable day although has gone in and out of rapid Afib, now on amiodarone drip BP stable Breathing comfortably on BiPAP Marginal UOP Creatinine up 1.6  Plan: Continue IV amiodarone.  Try IV lasix and watch renal function.  Purcell Nails, MD 08/06/2019 8:24 PM

## 2019-08-06 NOTE — Progress Notes (Signed)
[address]       [place] [address]             [phone]        CARDIOTHORACIC SURGERY PROGRESS NOTE   R1 Day Post-Op Procedure(s) (LRB): CORONARY ARTERY BYPASS GRAFTING (CABG), ON PUMP, TIMES THREE, USING LEFT INTERNAL MAMMARY ARTERY AND ENDOSCOPICALLY HARVESTED LEFT GREATER SAPHENOUS VEIN (N/A) TRANSESOPHAGEAL ECHOCARDIOGRAM (TEE) (N/A)  Subjective: Feels okay.  Breathing comfortably on BiPAP  Objective: Vital signs: BP Readings from Last 1 Encounters:  08/06/19 106/66   Pulse Readings from Last 1 Encounters:  08/06/19 98   Resp Readings from Last 1 Encounters:  08/06/19 18   Temp Readings from Last 1 Encounters:  08/06/19 98.1 F (36.7 C)    Hemodynamics: PAP: (28-61)/(16-36) 34/29 CO:  [4.4 L/min-7 L/min] 5 L/min CI:  [2.3 L/min/m2-3.6 L/min/m2] 2.6 L/min/m2  Physical Exam:  Rhythm:   Afib w/ RVR  Breath sounds: coarse  Heart sounds:  tachy  Incisions:  Dressing dry, intact  Abdomen:  Soft, non-distended, non-tender  Extremities:  Warm, well-perfused  Chest tubes:  low volume thin serosanguinous output, no air leak    Intake/Output from previous day: 05/14 0701 - 05/15 0700 In: 6063.7 [P.O.:240; I.V.:4881.4; Blood:245; IV Piggyback:697.3] Out: 2603 [Urine:1775; Blood:350; Chest Tube:478] Intake/Output this shift: Total I/O In: 587.6 [P.O.:366; I.V.:121.6; IV Piggyback:100] Out: 64 [Urine:34; Chest Tube:30]  Lab Results:  CBC: Recent Labs    08/05/19 2018 08/06/19 0313  WBC 9.2 12.1*  HGB 10.9* 11.0*  HCT 33.1* 33.5*  PLT 160 176    BMET:  Recent Labs    08/05/19 2018 08/06/19 0313  NA 135 136  K 4.6 4.5  CL 103 105  CO2 22 21*  GLUCOSE 141* 121*  BUN 9 11  CREATININE 0.96 1.28*  CALCIUM 7.8* 8.1*     PT/INR:   Recent Labs    08/05/19 1427  LABPROT 16.4*  INR 1.4*    CBG (last 3)  Recent Labs    08/06/19 0655 08/06/19 0820 08/06/19 0952  GLUCAP 131* 130* 129*    ABG    Component  Value Date/Time   PHART 7.323 (L) 08/05/2019 1859   PCO2ART 49.2 (H) 08/05/2019 1859   PO2ART 75 (L) 08/05/2019 1859   HCO3 25.4 08/05/2019 1859   TCO2 27 08/05/2019 1859   ACIDBASEDEF 1.0 08/05/2019 1859   O2SAT 93.0 08/05/2019 1859    CXR: PORTABLE CHEST 1 VIEW  COMPARISON:  Chest x-ray dated 08/05/2019.  FINDINGS: Stable cardiomegaly. LEFT-sided chest tube has retracted slightly with tip now at the level of the LEFT hilum. Swan-Ganz catheter is stable in position with tip slightly to the LEFT of midline.  Endotracheal tube has been removed. Enteric tube has been removed. Lungs are clear. No pneumothorax is seen.  IMPRESSION: 1. LEFT-sided chest tube has retracted slightly with tip now at the level of the LEFT hilum. 2. Lungs are clear. No pneumothorax seen.   Electronically Signed   By: Bary Richard M.D.   On: 08/06/2019 09:58    EKG: Afib w/ RVR   Assessment/Plan: S/P Procedure(s) (LRB): CORONARY ARTERY BYPASS GRAFTING (CABG), ON PUMP, TIMES THREE, USING LEFT INTERNAL MAMMARY ARTERY AND ENDOSCOPICALLY HARVESTED LEFT GREATER SAPHENOUS VEIN (N/A) TRANSESOPHAGEAL ECHOCARDIOGRAM (TEE) (N/A)  Remains in Afib w/ RVR Stable BP and hemodynamics on milrinone 0.25 Breathing comfortably w/ O2 sats 91-95% on BiPAP, CXR clear Expected post op acute blood loss anemia, mild Acute on chronic systolic  CHF s/p acute MI with ischemic cardiomyopathy and expected post-op volume excess Tobacco abuse, likely COPD   Repeat bolus amiodarone and continue drip  Consider adding diltiazem for rate control  Continue low dose milrinone for now  Pulm toilet, BiPAP as needed  Gentle diuresis   Rexene Alberts, MD 08/06/2019 10:31 AM

## 2019-08-07 ENCOUNTER — Inpatient Hospital Stay (HOSPITAL_COMMUNITY): Payer: Medicare Other

## 2019-08-07 LAB — CBC
HCT: 31.8 % — ABNORMAL LOW (ref 39.0–52.0)
Hemoglobin: 10.4 g/dL — ABNORMAL LOW (ref 13.0–17.0)
MCH: 30.4 pg (ref 26.0–34.0)
MCHC: 32.7 g/dL (ref 30.0–36.0)
MCV: 93 fL (ref 80.0–100.0)
Platelets: 156 10*3/uL (ref 150–400)
RBC: 3.42 MIL/uL — ABNORMAL LOW (ref 4.22–5.81)
RDW: 13.4 % (ref 11.5–15.5)
WBC: 10.1 10*3/uL (ref 4.0–10.5)
nRBC: 0 % (ref 0.0–0.2)

## 2019-08-07 LAB — BASIC METABOLIC PANEL
Anion gap: 12 (ref 5–15)
BUN: 16 mg/dL (ref 8–23)
CO2: 22 mmol/L (ref 22–32)
Calcium: 8.4 mg/dL — ABNORMAL LOW (ref 8.9–10.3)
Chloride: 99 mmol/L (ref 98–111)
Creatinine, Ser: 1.33 mg/dL — ABNORMAL HIGH (ref 0.61–1.24)
GFR calc Af Amer: >60 mL/min (ref >60)
GFR calc non Af Amer: 56 mL/min — ABNORMAL LOW (ref >60)
Glucose, Bld: 115 mg/dL — ABNORMAL HIGH (ref 70–99)
Potassium: 4.2 mmol/L (ref 3.5–5.1)
Sodium: 133 mmol/L — ABNORMAL LOW (ref 135–145)

## 2019-08-07 LAB — GLUCOSE, CAPILLARY
Glucose-Capillary: 98 mg/dL (ref 70–99)
Glucose-Capillary: 106 mg/dL — ABNORMAL HIGH (ref 70–99)
Glucose-Capillary: 105 mg/dL — ABNORMAL HIGH (ref 70–99)
Glucose-Capillary: 105 mg/dL — ABNORMAL HIGH (ref 70–99)
Glucose-Capillary: 114 mg/dL — ABNORMAL HIGH (ref 70–99)

## 2019-08-07 MED ORDER — SODIUM CHLORIDE 0.9 % IV SOLN
2.0000 g | Freq: Three times a day (TID) | INTRAVENOUS | Status: DC
  Administered 2019-08-07 – 2019-08-11 (×12): 2 g via INTRAVENOUS
  Filled 2019-08-07 (×15): qty 2

## 2019-08-07 MED ORDER — ~~LOC~~ CARDIAC SURGERY, PATIENT & FAMILY EDUCATION: Freq: Once | Status: AC

## 2019-08-07 MED ORDER — LEVALBUTEROL HCL 1.25 MG/0.5ML IN NEBU
1.2500 mg | INHALATION_SOLUTION | Freq: Three times a day (TID) | RESPIRATORY_TRACT | Status: DC
  Administered 2019-08-07 – 2019-08-09 (×8): 1.25 mg via RESPIRATORY_TRACT
  Filled 2019-08-07 (×8): qty 0.5

## 2019-08-07 MED ORDER — LEVALBUTEROL HCL 1.25 MG/0.5ML IN NEBU: 1.2500 mg | INHALATION_SOLUTION | Freq: Four times a day (QID) | RESPIRATORY_TRACT | Status: DC

## 2019-08-07 MED ORDER — FUROSEMIDE 10 MG/ML IJ SOLN
40.0000 mg | Freq: Two times a day (BID) | INTRAMUSCULAR | Status: AC
  Administered 2019-08-07 (×2): 40 mg via INTRAVENOUS
  Filled 2019-08-07 (×2): qty 4

## 2019-08-07 MED ORDER — GUAIFENESIN ER 600 MG PO TB12
1200.0000 mg | ORAL_TABLET | Freq: Two times a day (BID) | ORAL | Status: AC
  Administered 2019-08-07 – 2019-08-09 (×6): 1200 mg via ORAL
  Filled 2019-08-07 (×6): qty 2

## 2019-08-07 NOTE — Progress Notes (Addendum)
[address]       [name] [address]             [phone]        CARDIOTHORACIC SURGERY PROGRESS NOTE   R2 Days Post-Op Procedure(s) (LRB): CORONARY ARTERY BYPASS GRAFTING (CABG), ON PUMP, TIMES THREE, USING LEFT INTERNAL MAMMARY ARTERY AND ENDOSCOPICALLY HARVESTED LEFT GREATER SAPHENOUS VEIN (N/A) TRANSESOPHAGEAL ECHOCARDIOGRAM (TEE) (N/A)  Subjective: Complains of pain associated w/ chest tube.  Still required BiPAP overnight due to increased O2 requirements.  Productive cough.  Converted back to NSR overnight.  Objective: Vital signs: BP Readings from Last 1 Encounters:  08/07/19 111/65   Pulse Readings from Last 1 Encounters:  08/07/19 82   Resp Readings from Last 1 Encounters:  08/07/19 14   Temp Readings from Last 1 Encounters:  08/07/19 (!) 97.5 F (36.4 C) (Axillary)    Hemodynamics: PAP: (60)/(22) 60/22  Physical Exam:  Rhythm:   sinus  Breath sounds: Scattered rhonchi  Heart sounds:  RRR  Incisions:  Dressing dry intact  Abdomen:  Soft, non-distended, non-tender  Extremities:  Warm, well-perfused  Chest tubes:  low volume thin serosanguinous output, no air leak    Intake/Output from previous day: 05/15 0701 - 05/16 0700 In: 2146.9 [P.O.:426; I.V.:1520.9; IV Piggyback:200] Out: 1284 [Urine:1124; Chest Tube:160] Intake/Output this shift: Total I/O In: 398 [P.O.:120; I.V.:178; IV Piggyback:100] Out: 105 [Urine:105]  Lab Results:  CBC: Recent Labs    08/06/19 1627 08/07/19 0329  WBC 14.2* 10.1  HGB 11.1* 10.4*  HCT 33.5* 31.8*  PLT 185 156    BMET:  Recent Labs    08/06/19 1627 08/07/19 0329  NA 135 133*  K 4.5 4.2  CL 103 99  CO2 20* 22  GLUCOSE 131* 115*  BUN 15 16  CREATININE 1.65* 1.33*  CALCIUM 8.4* 8.4*     PT/INR:   Recent Labs    08/05/19 1427  LABPROT 16.4*  INR 1.4*    CBG (last 3)  Recent Labs    08/06/19 2328 08/07/19 0327 08/07/19 0722  GLUCAP 126* 98 106*    ABG      Component Value Date/Time   PHART 7.323 (L) 08/05/2019 1859   PCO2ART 49.2 (H) 08/05/2019 1859   PO2ART 75 (L) 08/05/2019 1859   HCO3 25.4 08/05/2019 1859   TCO2 27 08/05/2019 1859   ACIDBASEDEF 1.0 08/05/2019 1859   O2SAT 93.0 08/05/2019 1859    CXR: PORTABLE CHEST 1 VIEW  COMPARISON:  08/06/2019.  FINDINGS: Since the previous exam, hazy airspace opacity has increased in the right mid lung and left mid to lower lung, associated with mild stable interstitial thickening. Additional opacity at the left lung base is stable consistent with atelectasis. Left lower hemithorax chest tube is unchanged.  The Swan-Ganz catheter has been removed. The introducer sheath remains in place.  No pneumothorax.  No mediastinal widening.  IMPRESSION: 1. Interval worsening lung aeration with new hazy airspace opacities developing in the right mid lung and left mid to lower lung. Opacities may reflect asymmetric edema. Multifocal infection or inflammation is possible but felt less likely. 2. Status post removal of the Swan-Ganz catheter. 3. No other change.   Electronically Signed   By: Amie Portland M.D.   On: 08/07/2019 09:29  Assessment/Plan: S/P Procedure(s) (LRB): CORONARY ARTERY BYPASS GRAFTING (CABG), ON PUMP, TIMES THREE, USING LEFT INTERNAL MAMMARY ARTERY AND ENDOSCOPICALLY HARVESTED LEFT GREATER SAPHENOUS VEIN (N/A) TRANSESOPHAGEAL ECHOCARDIOGRAM (TEE) (N/A)  Overall stable  POD2 Post-op Afib now back in NSR on IV amiodarone, BP stable Increased O2 requirement currently maintaining O2 sats 89-92% on HFNC CXR w/ patchy opacity right lung field c/w edema +/- pneumonia, productive cough Acute on chronic systolic CHF s/p acute MI with ischemic cardiomyopathy and expected post-op volume excess Post op elevated serum creatinine - acute exacerbation of baseline CKD, likely due to prerenal azotemia +/- acute kidney injury caused by ATN, creatinine trending down and UOP  adequate Expected post op acute blood loss anemia, mild, stable Tobacco abuse, likely COPD   D/C chest tubes  Mobilize  Wean milrinone off  Start empiric maxepime  Nebs  Humibid  Gentle diuresis  Continue IV amiodarone  Lovenox for DVT prophylaxis  Rexene Alberts, MD 08/07/2019 10:53 AM

## 2019-08-07 NOTE — Progress Notes (Signed)
TCTS BRIEF SICU PROGRESS NOTE  2 Days Post-Op  S/P Procedure(s) (LRB): CORONARY ARTERY BYPASS GRAFTING (CABG), ON PUMP, TIMES THREE, USING LEFT INTERNAL MAMMARY ARTERY AND ENDOSCOPICALLY HARVESTED LEFT GREATER SAPHENOUS VEIN (N/A) TRANSESOPHAGEAL ECHOCARDIOGRAM (TEE) (N/A)   Stable day Maintaining NSR w/ stable BP off milrinone O2 sats 89-91% on 6 L/min UOP adequate  Plan: Continue current plan  Purcell Nails, MD 08/07/2019 5:38 PM

## 2019-08-08 ENCOUNTER — Inpatient Hospital Stay (HOSPITAL_COMMUNITY): Payer: Medicare Other

## 2019-08-08 LAB — POCT I-STAT, CHEM 8
BUN: 10 mg/dL (ref 8–23)
BUN: 10 mg/dL (ref 8–23)
BUN: 8 mg/dL (ref 8–23)
BUN: 9 mg/dL (ref 8–23)
BUN: 9 mg/dL (ref 8–23)
BUN: 8 mg/dL (ref 8–23)
Calcium, Ion: 1.11 mmol/L — ABNORMAL LOW (ref 1.15–1.40)
Calcium, Ion: 1.02 mmol/L — ABNORMAL LOW (ref 1.15–1.40)
Calcium, Ion: 1.19 mmol/L (ref 1.15–1.40)
Calcium, Ion: 1.13 mmol/L — ABNORMAL LOW (ref 1.15–1.40)
Calcium, Ion: 1.14 mmol/L — ABNORMAL LOW (ref 1.15–1.40)
Calcium, Ion: 1.17 mmol/L (ref 1.15–1.40)
Chloride: 98 mmol/L (ref 98–111)
Chloride: 100 mmol/L (ref 98–111)
Chloride: 98 mmol/L (ref 98–111)
Chloride: 100 mmol/L (ref 98–111)
Chloride: 101 mmol/L (ref 98–111)
Chloride: 102 mmol/L (ref 98–111)
Creatinine, Ser: 0.8 mg/dL (ref 0.61–1.24)
Creatinine, Ser: 0.6 mg/dL — ABNORMAL LOW (ref 0.61–1.24)
Creatinine, Ser: 0.8 mg/dL (ref 0.61–1.24)
Creatinine, Ser: 0.7 mg/dL (ref 0.61–1.24)
Creatinine, Ser: 0.7 mg/dL (ref 0.61–1.24)
Creatinine, Ser: 0.7 mg/dL (ref 0.61–1.24)
Glucose, Bld: 114 mg/dL — ABNORMAL HIGH (ref 70–99)
Glucose, Bld: 110 mg/dL — ABNORMAL HIGH (ref 70–99)
Glucose, Bld: 129 mg/dL — ABNORMAL HIGH (ref 70–99)
Glucose, Bld: 106 mg/dL — ABNORMAL HIGH (ref 70–99)
Glucose, Bld: 113 mg/dL — ABNORMAL HIGH (ref 70–99)
Glucose, Bld: 140 mg/dL — ABNORMAL HIGH (ref 70–99)
HCT: 39 % (ref 39.0–52.0)
HCT: 29 % — ABNORMAL LOW (ref 39.0–52.0)
HCT: 34 % — ABNORMAL LOW (ref 39.0–52.0)
HCT: 27 % — ABNORMAL LOW (ref 39.0–52.0)
HCT: 28 % — ABNORMAL LOW (ref 39.0–52.0)
HCT: 29 % — ABNORMAL LOW (ref 39.0–52.0)
Hemoglobin: 13.3 g/dL (ref 13.0–17.0)
Hemoglobin: 11.6 g/dL — ABNORMAL LOW (ref 13.0–17.0)
Hemoglobin: 9.9 g/dL — ABNORMAL LOW (ref 13.0–17.0)
Hemoglobin: 9.2 g/dL — ABNORMAL LOW (ref 13.0–17.0)
Hemoglobin: 9.5 g/dL — ABNORMAL LOW (ref 13.0–17.0)
Hemoglobin: 9.9 g/dL — ABNORMAL LOW (ref 13.0–17.0)
Potassium: 4 mmol/L (ref 3.5–5.1)
Potassium: 4.4 mmol/L (ref 3.5–5.1)
Potassium: 4.7 mmol/L (ref 3.5–5.1)
Potassium: 4.5 mmol/L (ref 3.5–5.1)
Potassium: 5.3 mmol/L — ABNORMAL HIGH (ref 3.5–5.1)
Potassium: 4 mmol/L (ref 3.5–5.1)
Sodium: 135 mmol/L (ref 135–145)
Sodium: 134 mmol/L — ABNORMAL LOW (ref 135–145)
Sodium: 136 mmol/L (ref 135–145)
Sodium: 135 mmol/L (ref 135–145)
Sodium: 137 mmol/L (ref 135–145)
Sodium: 138 mmol/L (ref 135–145)
TCO2: 30 mmol/L (ref 22–32)
TCO2: 27 mmol/L (ref 22–32)
TCO2: 31 mmol/L (ref 22–32)
TCO2: 28 mmol/L (ref 22–32)
TCO2: 29 mmol/L (ref 22–32)
TCO2: 27 mmol/L (ref 22–32)

## 2019-08-08 LAB — POCT I-STAT 7, (LYTES, BLD GAS, ICA,H+H)
Acid-Base Excess: 1 mmol/L (ref 0.0–2.0)
Acid-Base Excess: 4 mmol/L — ABNORMAL HIGH (ref 0.0–2.0)
Acid-Base Excess: 4 mmol/L — ABNORMAL HIGH (ref 0.0–2.0)
Acid-Base Excess: 4 mmol/L — ABNORMAL HIGH (ref 0.0–2.0)
Acid-Base Excess: 2 mmol/L (ref 0.0–2.0)
Bicarbonate: 27.7 mmol/L (ref 20.0–28.0)
Bicarbonate: 28.1 mmol/L — ABNORMAL HIGH (ref 20.0–28.0)
Bicarbonate: 29.3 mmol/L — ABNORMAL HIGH (ref 20.0–28.0)
Bicarbonate: 27.3 mmol/L (ref 20.0–28.0)
Bicarbonate: 27.3 mmol/L (ref 20.0–28.0)
Calcium, Ion: 1.28 mmol/L (ref 1.15–1.40)
Calcium, Ion: 1.03 mmol/L — ABNORMAL LOW (ref 1.15–1.40)
Calcium, Ion: 1.1 mmol/L — ABNORMAL LOW (ref 1.15–1.40)
Calcium, Ion: 1.13 mmol/L — ABNORMAL LOW (ref 1.15–1.40)
Calcium, Ion: 1.15 mmol/L (ref 1.15–1.40)
HCT: 39 % (ref 39.0–52.0)
HCT: 28 % — ABNORMAL LOW (ref 39.0–52.0)
HCT: 30 % — ABNORMAL LOW (ref 39.0–52.0)
HCT: 28 % — ABNORMAL LOW (ref 39.0–52.0)
HCT: 33 % — ABNORMAL LOW (ref 39.0–52.0)
Hemoglobin: 13.3 g/dL (ref 13.0–17.0)
Hemoglobin: 10.2 g/dL — ABNORMAL LOW (ref 13.0–17.0)
Hemoglobin: 9.5 g/dL — ABNORMAL LOW (ref 13.0–17.0)
Hemoglobin: 9.5 g/dL — ABNORMAL LOW (ref 13.0–17.0)
Hemoglobin: 11.2 g/dL — ABNORMAL LOW (ref 13.0–17.0)
O2 Saturation: 100 %
O2 Saturation: 100 %
O2 Saturation: 100 %
O2 Saturation: 100 %
O2 Saturation: 100 %
Potassium: 3.9 mmol/L (ref 3.5–5.1)
Potassium: 4.6 mmol/L (ref 3.5–5.1)
Potassium: 4.7 mmol/L (ref 3.5–5.1)
Potassium: 5.3 mmol/L — ABNORMAL HIGH (ref 3.5–5.1)
Potassium: 4.5 mmol/L (ref 3.5–5.1)
Sodium: 135 mmol/L (ref 135–145)
Sodium: 135 mmol/L (ref 135–145)
Sodium: 137 mmol/L (ref 135–145)
Sodium: 135 mmol/L (ref 135–145)
Sodium: 137 mmol/L (ref 135–145)
TCO2: 29 mmol/L (ref 22–32)
TCO2: 29 mmol/L (ref 22–32)
TCO2: 31 mmol/L (ref 22–32)
TCO2: 28 mmol/L (ref 22–32)
TCO2: 29 mmol/L (ref 22–32)
pCO2 arterial: 51.5 mmHg — ABNORMAL HIGH (ref 32.0–48.0)
pCO2 arterial: 40.5 mmHg (ref 32.0–48.0)
pCO2 arterial: 45.5 mmHg (ref 32.0–48.0)
pCO2 arterial: 35.7 mmHg (ref 32.0–48.0)
pCO2 arterial: 46.1 mmHg (ref 32.0–48.0)
pH, Arterial: 7.339 — ABNORMAL LOW (ref 7.350–7.450)
pH, Arterial: 7.418 (ref 7.350–7.450)
pH, Arterial: 7.449 (ref 7.350–7.450)
pH, Arterial: 7.492 — ABNORMAL HIGH (ref 7.350–7.450)
pH, Arterial: 7.381 (ref 7.350–7.450)
pO2, Arterial: 408 mmHg — ABNORMAL HIGH (ref 83.0–108.0)
pO2, Arterial: 315 mmHg — ABNORMAL HIGH (ref 83.0–108.0)
pO2, Arterial: 413 mmHg — ABNORMAL HIGH (ref 83.0–108.0)
pO2, Arterial: 248 mmHg — ABNORMAL HIGH (ref 83.0–108.0)
pO2, Arterial: 396 mmHg — ABNORMAL HIGH (ref 83.0–108.0)

## 2019-08-08 LAB — GLUCOSE, CAPILLARY
Glucose-Capillary: 94 mg/dL (ref 70–99)
Glucose-Capillary: 115 mg/dL — ABNORMAL HIGH (ref 70–99)
Glucose-Capillary: 100 mg/dL — ABNORMAL HIGH (ref 70–99)
Glucose-Capillary: 95 mg/dL (ref 70–99)
Glucose-Capillary: 108 mg/dL — ABNORMAL HIGH (ref 70–99)
Glucose-Capillary: 103 mg/dL — ABNORMAL HIGH (ref 70–99)
Glucose-Capillary: 102 mg/dL — ABNORMAL HIGH (ref 70–99)

## 2019-08-08 LAB — CBC
HCT: 31.5 % — ABNORMAL LOW (ref 39.0–52.0)
Hemoglobin: 10.4 g/dL — ABNORMAL LOW (ref 13.0–17.0)
MCH: 30.9 pg (ref 26.0–34.0)
MCHC: 33 g/dL (ref 30.0–36.0)
MCV: 93.5 fL (ref 80.0–100.0)
Platelets: 193 10*3/uL (ref 150–400)
RBC: 3.37 MIL/uL — ABNORMAL LOW (ref 4.22–5.81)
RDW: 13.9 % (ref 11.5–15.5)
WBC: 9 10*3/uL (ref 4.0–10.5)
nRBC: 0 % (ref 0.0–0.2)

## 2019-08-08 LAB — BASIC METABOLIC PANEL
Anion gap: 9 (ref 5–15)
BUN: 18 mg/dL (ref 8–23)
CO2: 24 mmol/L (ref 22–32)
Calcium: 8.3 mg/dL — ABNORMAL LOW (ref 8.9–10.3)
Chloride: 100 mmol/L (ref 98–111)
Creatinine, Ser: 1.16 mg/dL (ref 0.61–1.24)
GFR calc Af Amer: >60 mL/min (ref >60)
GFR calc non Af Amer: >60 mL/min (ref >60)
Glucose, Bld: 129 mg/dL — ABNORMAL HIGH (ref 70–99)
Potassium: 3.9 mmol/L (ref 3.5–5.1)
Sodium: 133 mmol/L — ABNORMAL LOW (ref 135–145)

## 2019-08-08 MED ORDER — ACETAMINOPHEN 325 MG PO TABS: 650.0000 mg | ORAL_TABLET | Freq: Four times a day (QID) | ORAL | Status: DC | PRN

## 2019-08-08 MED ORDER — SODIUM CHLORIDE 0.9% FLUSH: 3.0000 mL | INTRAVENOUS | Status: DC | PRN

## 2019-08-08 MED ORDER — ONDANSETRON HCL 4 MG/2ML IJ SOLN: 4.0000 mg | Freq: Four times a day (QID) | INTRAMUSCULAR | Status: DC | PRN

## 2019-08-08 MED ORDER — FUROSEMIDE 10 MG/ML IJ SOLN
40.0000 mg | Freq: Once | INTRAMUSCULAR | Status: AC
  Administered 2019-08-08: 40 mg via INTRAVENOUS
  Filled 2019-08-08: qty 4

## 2019-08-08 MED ORDER — POTASSIUM CHLORIDE CRYS ER 20 MEQ PO TBCR
20.0000 meq | EXTENDED_RELEASE_TABLET | Freq: Two times a day (BID) | ORAL | Status: AC
  Administered 2019-08-09 – 2019-08-15 (×14): 20 meq via ORAL
  Filled 2019-08-08 (×14): qty 1

## 2019-08-08 MED ORDER — AMIODARONE HCL 200 MG PO TABS
200.0000 mg | ORAL_TABLET | Freq: Two times a day (BID) | ORAL | Status: DC
  Administered 2019-08-08: 200 mg via ORAL
  Filled 2019-08-08: qty 1

## 2019-08-08 MED ORDER — TRAMADOL HCL 50 MG PO TABS
50.0000 mg | ORAL_TABLET | Freq: Four times a day (QID) | ORAL | Status: DC | PRN
  Administered 2019-08-12 – 2019-08-16 (×5): 50 mg via ORAL
  Filled 2019-08-08 (×5): qty 1

## 2019-08-08 MED ORDER — DIGOXIN 0.25 MG/ML IJ SOLN
0.2500 mg | Freq: Once | INTRAMUSCULAR | Status: AC
  Administered 2019-08-08: 0.25 mg via INTRAVENOUS
  Filled 2019-08-08 (×2): qty 1

## 2019-08-08 MED ORDER — OXYCODONE HCL 5 MG PO TABS
5.0000 mg | ORAL_TABLET | ORAL | Status: DC | PRN
  Administered 2019-08-08 – 2019-08-13 (×18): 10 mg via ORAL
  Administered 2019-08-14 – 2019-08-15 (×2): 5 mg via ORAL
  Administered 2019-08-15: 10 mg via ORAL
  Filled 2019-08-08 (×12): qty 2
  Filled 2019-08-08: qty 1
  Filled 2019-08-08 (×2): qty 2
  Filled 2019-08-08 (×2): qty 1
  Filled 2019-08-08 (×5): qty 2

## 2019-08-08 MED ORDER — ASPIRIN EC 325 MG PO TBEC
325.0000 mg | DELAYED_RELEASE_TABLET | Freq: Every day | ORAL | Status: DC
  Administered 2019-08-09 – 2019-08-10 (×2): 325 mg via ORAL
  Filled 2019-08-08 (×3): qty 1

## 2019-08-08 MED ORDER — POTASSIUM CHLORIDE CRYS ER 20 MEQ PO TBCR
40.0000 meq | EXTENDED_RELEASE_TABLET | Freq: Two times a day (BID) | ORAL | Status: AC
  Administered 2019-08-08 (×2): 40 meq via ORAL
  Filled 2019-08-08 (×2): qty 2

## 2019-08-08 MED ORDER — DOCUSATE SODIUM 100 MG PO CAPS
200.0000 mg | ORAL_CAPSULE | Freq: Every day | ORAL | Status: DC
  Administered 2019-08-09 – 2019-08-15 (×7): 200 mg via ORAL
  Filled 2019-08-08 (×8): qty 2

## 2019-08-08 MED ORDER — FUROSEMIDE 40 MG PO TABS
40.0000 mg | ORAL_TABLET | Freq: Every day | ORAL | Status: DC
  Administered 2019-08-09 – 2019-08-11 (×3): 40 mg via ORAL
  Filled 2019-08-08 (×3): qty 1

## 2019-08-08 MED ORDER — METOLAZONE 5 MG PO TABS
2.5000 mg | ORAL_TABLET | Freq: Every day | ORAL | Status: AC
  Administered 2019-08-09 – 2019-08-10 (×2): 2.5 mg via ORAL
  Filled 2019-08-08 (×2): qty 1

## 2019-08-08 MED ORDER — ~~LOC~~ CARDIAC SURGERY, PATIENT & FAMILY EDUCATION: Freq: Once | Status: DC

## 2019-08-08 MED ORDER — PANTOPRAZOLE SODIUM 40 MG PO TBEC
40.0000 mg | DELAYED_RELEASE_TABLET | Freq: Every day | ORAL | Status: DC
  Administered 2019-08-09 – 2019-08-16 (×8): 40 mg via ORAL
  Filled 2019-08-08 (×8): qty 1

## 2019-08-08 MED ORDER — SODIUM CHLORIDE 0.9 % IV SOLN: 250.0000 mL | INTRAVENOUS | Status: DC | PRN

## 2019-08-08 MED ORDER — SODIUM CHLORIDE 0.9% FLUSH
3.0000 mL | Freq: Two times a day (BID) | INTRAVENOUS | Status: DC
  Administered 2019-08-08 – 2019-08-15 (×11): 3 mL via INTRAVENOUS

## 2019-08-08 MED ORDER — INSULIN ASPART 100 UNIT/ML ~~LOC~~ SOLN
0.0000 [IU] | Freq: Three times a day (TID) | SUBCUTANEOUS | Status: DC
  Administered 2019-08-09: 2 [IU] via SUBCUTANEOUS

## 2019-08-08 MED ORDER — DIGOXIN 0.25 MG/ML IJ SOLN
0.5000 mg | Freq: Once | INTRAMUSCULAR | Status: AC
  Administered 2019-08-08: 0.5 mg via INTRAVENOUS
  Filled 2019-08-08: qty 2

## 2019-08-08 MED ORDER — ONDANSETRON HCL 4 MG PO TABS: 4.0000 mg | ORAL_TABLET | Freq: Four times a day (QID) | ORAL | Status: DC | PRN

## 2019-08-08 MED ORDER — AMIODARONE HCL 200 MG PO TABS
400.0000 mg | ORAL_TABLET | Freq: Two times a day (BID) | ORAL | Status: DC
  Administered 2019-08-08 – 2019-08-14 (×12): 400 mg via ORAL
  Filled 2019-08-08 (×12): qty 2

## 2019-08-08 MED ORDER — METOLAZONE 5 MG PO TABS
5.0000 mg | ORAL_TABLET | Freq: Once | ORAL | Status: AC
  Administered 2019-08-08: 5 mg via ORAL
  Filled 2019-08-08: qty 1

## 2019-08-08 MED FILL — Thrombin (Recombinant) For Soln 20000 Unit: CUTANEOUS | Qty: 1 | Status: AC

## 2019-08-08 NOTE — Progress Notes (Signed)
CARDIAC REHAB PHASE I   Went to walk with pt, pt up with mobility team. Pt states its his first time walking in hallway since surgery. HR 124 Afib. Will continue to follow and encourage continued ambulation.  Reynold Bowen, RN BSN 08/08/2019 1:26 PM

## 2019-08-08 NOTE — Progress Notes (Signed)
3 Days Post-Op Procedure(s) (LRB): CORONARY ARTERY BYPASS GRAFTING (CABG), ON PUMP, TIMES THREE, USING LEFT INTERNAL MAMMARY ARTERY AND ENDOSCOPICALLY HARVESTED LEFT GREATER SAPHENOUS VEIN (N/A) TRANSESOPHAGEAL ECHOCARDIOGRAM (TEE) (N/A) Subjective: No complaints. Has been on HFNC all night without bipap  Coughing up some clear sputum.  Objective: Vital signs in last 24 hours: Temp:  [98.1 F (36.7 C)-98.5 F (36.9 C)] 98.2 F (36.8 C) (05/17 0400) Pulse Rate:  [71-105] 73 (05/17 0600) Cardiac Rhythm: Normal sinus rhythm (05/17 0400) Resp:  [9-27] 14 (05/17 0600) BP: (81-134)/(54-88) 115/72 (05/17 0600) SpO2:  [87 %-100 %] 95 % (05/17 0600) Arterial Line BP: (103-166)/(45-69) 121/50 (05/16 1545) Weight:  [90 kg] 90 kg (05/17 0500)  Hemodynamic parameters for last 24 hours:    Intake/Output from previous day: 05/16 0701 - 05/17 0700 In: 2062.2 [P.O.:480; I.V.:1258.7; IV Piggyback:323.5] Out: 1290 [Urine:1280; Chest Tube:10] Intake/Output this shift: No intake/output data recorded.  General appearance: alert and cooperative Neurologic: intact Heart: regular rate and rhythm, S1, S2 normal, no murmur Lungs: rales bilaterally Extremities: edema mild Wound: incisions ok  Lab Results: Recent Labs    08/07/19 0329 08/08/19 0351  WBC 10.1 9.0  HGB 10.4* 10.4*  HCT 31.8* 31.5*  PLT 156 193   BMET:  Recent Labs    08/07/19 0329 08/08/19 0351  NA 133* 133*  K 4.2 3.9  CL 99 100  CO2 22 24  GLUCOSE 115* 129*  BUN 16 18  CREATININE 1.33* 1.16  CALCIUM 8.4* 8.3*    PT/INR:  Recent Labs    08/05/19 1427  LABPROT 16.4*  INR 1.4*   ABG    Component Value Date/Time   PHART 7.323 (L) 08/05/2019 1859   HCO3 25.4 08/05/2019 1859   TCO2 27 08/05/2019 1859   ACIDBASEDEF 1.0 08/05/2019 1859   O2SAT 93.0 08/05/2019 1859   CBG (last 3)  Recent Labs    08/08/19 0109 08/08/19 0440 08/08/19 0652  GLUCAP 94 115* 100*   CXR: bilateral air space disease that may  be edema or infection.   Assessment/Plan: S/P Procedure(s) (LRB): CORONARY ARTERY BYPASS GRAFTING (CABG), ON PUMP, TIMES THREE, USING LEFT INTERNAL MAMMARY ARTERY AND ENDOSCOPICALLY HARVESTED LEFT GREATER SAPHENOUS VEIN (N/A) TRANSESOPHAGEAL ECHOCARDIOGRAM (TEE) (N/A)  POD 3  Hemodynamically stable in sinus rhythm this am. Holding on beta blocker due to COPD and low normal BP postop. May consider starting before discharge but we will see.  Atrial fib that started immediately preop and has continued postop. He went back into sinus overnight. Will switch amio to po. Hold off on anticoagulation for now. I am not sure he could handle Coumadin at home and probably can't afford Eliquis but if he continues to have episodes of AF we will have to consider anticoagulation.  Smoking and COPD: continue nebs, IS.  Possible pneumonia vs edema on CXR: Continue Maxipime. Start diuresis since weight is 20 lbs over preop. Amio could be contributing to air space disease so may need to stop if this does not resolve with diuresis and antibiotic. Repeat CXR in am. He has no fever or leukocytosis but did have fever POD 1.  DM: glucose under good control on SSI. preop Hgb A1c was 6.0 on no meds. Will follow on SSI.  Transfer to 4E.   LOS: 5 days    Alleen Borne 08/08/2019

## 2019-08-08 NOTE — Progress Notes (Signed)
Mobility Specialist - Progress Note     08/08/19 1340  Mobility  Activity Ambulated in hall  Level of Assistance Modified independent, requires aide device or extra time  Distance Ambulated (ft) 216 ft  Mobility Response Tolerated fair  Mobility performed by Mobility specialist  $Mobility charge 1 Mobility    Pre-mobility: 67 HR, 118/80 BP, 93% SpO2 Post-mobility: 109 HR, 125/77 BP, 92% SPO2  Pt states this was his first time walking during his stay, he had to briefly stop twice in order to catch his breath. He was on 8 L/min of O2 while ambulating. I left him in his chair with his call bell and phone in his lap. Pt was able to tell me his sternal precautions correctly.  Mamie Levers Mobility Specialist

## 2019-08-08 NOTE — Progress Notes (Signed)
Patient arrived to 4E room 12 at this time. V/s and assessment complete. Patient oriented to room and how to call nurse with any needs. Patient up in chair eating lunch now. Will continue to monitor.

## 2019-08-08 NOTE — Progress Notes (Signed)
   Maintaining normal sinus rhythm.  Amiodarone being converted to oral this morning.  Patient is alert and conversant.  No clinical evidence of heart failure.

## 2019-08-09 ENCOUNTER — Inpatient Hospital Stay (HOSPITAL_COMMUNITY): Payer: Medicare Other

## 2019-08-09 ENCOUNTER — Encounter: Payer: Self-pay | Admitting: *Deleted

## 2019-08-09 DIAGNOSIS — I480 Paroxysmal atrial fibrillation: Secondary | ICD-10-CM

## 2019-08-09 LAB — GLUCOSE, CAPILLARY
Glucose-Capillary: 128 mg/dL — ABNORMAL HIGH (ref 70–99)
Glucose-Capillary: 71 mg/dL (ref 70–99)
Glucose-Capillary: 99 mg/dL (ref 70–99)
Glucose-Capillary: 125 mg/dL — ABNORMAL HIGH (ref 70–99)

## 2019-08-09 LAB — BASIC METABOLIC PANEL
Anion gap: 9 (ref 5–15)
BUN: 18 mg/dL (ref 8–23)
CO2: 27 mmol/L (ref 22–32)
Calcium: 8.8 mg/dL — ABNORMAL LOW (ref 8.9–10.3)
Chloride: 99 mmol/L (ref 98–111)
Creatinine, Ser: 1.19 mg/dL (ref 0.61–1.24)
GFR calc Af Amer: >60 mL/min (ref >60)
GFR calc non Af Amer: >60 mL/min (ref >60)
Glucose, Bld: 124 mg/dL — ABNORMAL HIGH (ref 70–99)
Potassium: 4.9 mmol/L (ref 3.5–5.1)
Sodium: 135 mmol/L (ref 135–145)

## 2019-08-09 LAB — CBC
HCT: 30.4 % — ABNORMAL LOW (ref 39.0–52.0)
Hemoglobin: 10.2 g/dL — ABNORMAL LOW (ref 13.0–17.0)
MCH: 31 pg (ref 26.0–34.0)
MCHC: 33.6 g/dL (ref 30.0–36.0)
MCV: 92.4 fL (ref 80.0–100.0)
Platelets: 227 10*3/uL (ref 150–400)
RBC: 3.29 MIL/uL — ABNORMAL LOW (ref 4.22–5.81)
RDW: 14.1 % (ref 11.5–15.5)
WBC: 9.2 10*3/uL (ref 4.0–10.5)
nRBC: 0 % (ref 0.0–0.2)

## 2019-08-09 MED ORDER — METOPROLOL TARTRATE 25 MG PO TABS
25.0000 mg | ORAL_TABLET | Freq: Two times a day (BID) | ORAL | Status: DC
  Administered 2019-08-09 – 2019-08-10 (×3): 25 mg via ORAL
  Filled 2019-08-09 (×4): qty 1

## 2019-08-09 NOTE — Care Management Important Message (Signed)
Important Message  Patient Details  Name: [place] [name] MRN: [id number] Date of Birth: 07-Sep-1955   Medicare Important Message Given:  Yes     Renie Ora 08/09/2019, 9:30 AM

## 2019-08-09 NOTE — Progress Notes (Addendum)
Patient ambulated in hallway  470 ft with rolling walker and oxygen at 4L [place], oxygen sat dropped to 75%, standing rest break and oxygen increased to [facility] and oxygen remained upper 90s for the remainder of the walk. Back in room only requiring 3 L at rest back oxygen 98% in bed. Will monitor patient. Cloer, Randall An RN

## 2019-08-09 NOTE — Progress Notes (Signed)
   Still having breakthrough atrial fibrillation.  At this time, he is in normal sinus rhythm/sinus bradycardia

## 2019-08-09 NOTE — Progress Notes (Signed)
Mobility Specialist: Progress Note    08/09/19 1549  Mobility  Activity Ambulated in hall  Level of Assistance Contact guard assist, steadying assist  Assistive Device Front wheel walker  Distance Ambulated (ft) 500 ft  Mobility Response Tolerated well  Mobility performed by Mobility specialist  Bed Position Chair  $Mobility charge 1 Mobility   Pre-Mobility: 86 HR, 109/61 BP, 97% SpO2 Post-Mobility: 88 HR, 116/67 BP, 97% SpO2  Pt tolerated ambulation well. Pt had a chair follow just in case but did not need to sit and rest. Pt had to stop halfway through to clean his nose. Pt's O2 remained in the mid to high 90s throughout ambulation on 3L of O2. Pt was left in chair with call bell and phone within reach.   [facility] Kaileah Shevchenko Mobility Specialist

## 2019-08-09 NOTE — Progress Notes (Addendum)
[address]       [place] [address]             [phone]      4 Days Post-Op Procedure(s) (LRB): CORONARY ARTERY BYPASS GRAFTING (CABG), ON PUMP, TIMES THREE, USING LEFT INTERNAL MAMMARY ARTERY AND ENDOSCOPICALLY HARVESTED LEFT GREATER SAPHENOUS VEIN (N/A) TRANSESOPHAGEAL ECHOCARDIOGRAM (TEE) (N/A) Subjective: Feels ok, + sputum production Remains in  PAF, tachy at times  Objective: Vital signs in last 24 hours: Temp:  [97.4 F (36.3 C)-98.7 F (37.1 C)] 98.1 F (36.7 C) (05/18 0320) Pulse Rate:  [28-125] 77 (05/18 0607) Cardiac Rhythm: Atrial fibrillation (05/18 0130) Resp:  [13-30] 18 (05/18 0607) BP: (81-127)/(53-101) 126/74 (05/18 0320) SpO2:  [94 %-100 %] 100 % (05/18 0607) Weight:  [88.8 kg] 88.8 kg (05/18 0320)  Hemodynamic parameters for last 24 hours:    Intake/Output from previous day: 05/17 0701 - 05/18 0700 In: 780.6 [P.O.:240; I.V.:240.6; IV Piggyback:300.1] Out: 1400 [Urine:1400] Intake/Output this shift: No intake/output data recorded.  General appearance: alert, cooperative and no distress Heart: irregularly irregular rhythm Lungs: coarse ronchi throughout Abdomen: mild distension, + BS, mild LUQ Extremities: + Bilat LE edema L>R Wound: incis healing well  Lab Results: Recent Labs    08/08/19 0351 08/09/19 0311  WBC 9.0 9.2  HGB 10.4* 10.2*  HCT 31.5* 30.4*  PLT 193 227   BMET:  Recent Labs    08/08/19 0351 08/09/19 0311  NA 133* 135  K 3.9 4.9  CL 100 99  CO2 24 27  GLUCOSE 129* 124*  BUN 18 18  CREATININE 1.16 1.19  CALCIUM 8.3* 8.8*    PT/INR: No results for input(s): LABPROT, INR in the last 72 hours. ABG    Component Value Date/Time   PHART 7.323 (L) 08/05/2019 1859   HCO3 25.4 08/05/2019 1859   TCO2 27 08/05/2019 1859   ACIDBASEDEF 1.0 08/05/2019 1859   O2SAT 93.0 08/05/2019 1859   CBG (last 3)  Recent Labs    08/08/19 1628 08/08/19 2125 08/09/19 0637  GLUCAP 103* 102* 128*     Meds Scheduled Meds: . amiodarone  400 mg Oral BID  . aspirin EC  325 mg Oral Daily  . atorvastatin  80 mg Oral q1800  . [place] [facility], Patient & Family Education   Does not apply Once  . docusate sodium  200 mg Oral Daily  . enoxaparin (LOVENOX) injection  40 mg Subcutaneous QHS  . furosemide  40 mg Oral Daily  . guaiFENesin  1,200 mg Oral BID  . insulin aspart  0-24 Units Subcutaneous TID AC & HS  . levalbuterol  1.25 mg Nebulization TID  . levothyroxine  50 mcg Oral Q0600  . metolazone  2.5 mg Oral Daily  . pantoprazole  40 mg Oral QAC breakfast  . potassium chloride  20 mEq Oral BID  . sodium chloride flush  3 mL Intravenous Q12H   Continuous Infusions: . sodium chloride    . ceFEPime (MAXIPIME) IV 2 g (08/09/19 0534)   PRN Meds:.sodium chloride, acetaminophen, ondansetron **OR** ondansetron (ZOFRAN) IV, oxyCODONE, sodium chloride flush, traMADol  Xrays DG Chest Port 1 View  Result Date: 08/08/2019 CLINICAL DATA:  Status post open heart surgery. EXAM: PORTABLE CHEST 1 VIEW COMPARISON:  Aug 07, 2019. FINDINGS: Stable cardiomegaly. Sternotomy wires are noted. No pneumothorax or pleural effusion is noted. Left-sided chest tube has been removed. Right internal jugular venous sheath is noted. Stable bilateral midlung opacities are  noted concerning for edema or possibly inflammation. Bony thorax is unremarkable. IMPRESSION: No pneumothorax is noted status post left-sided chest tube removal. Stable bilateral midlung opacities are noted concerning for edema or possibly inflammation. Electronically Signed   By: Lupita Raider M.D.   On: 08/08/2019 08:07    Results for orders placed or performed during the hospital encounter of 08/03/19  SARS Coronavirus 2 by RT PCR (hospital order, performed in [facility] hospital lab) Nasopharyngeal Nasopharyngeal Swab     Status: None   Collection Time: 08/03/19 12:09 PM   Specimen: Nasopharyngeal Swab  Result Value Ref Range  Status   SARS Coronavirus 2 NEGATIVE NEGATIVE Final    Comment: (NOTE) SARS-CoV-2 target nucleic acids are NOT DETECTED. The SARS-CoV-2 RNA is generally detectable in upper and lower respiratory specimens during the acute phase of infection. The lowest concentration of SARS-CoV-2 viral copies this assay can detect is 250 copies / mL. A negative result does not preclude SARS-CoV-2 infection and should not be used as the sole basis for treatment or other patient management decisions.  A negative result may occur with improper specimen collection / handling, submission of specimen other than nasopharyngeal swab, presence of viral mutation(s) within the areas targeted by this assay, and inadequate number of viral copies (<250 copies / mL). A negative result must be combined with clinical observations, patient history, and epidemiological information. Fact Sheet for Patients:   BoilerBrush.com.cy Fact Sheet for Healthcare Providers: https://pope.com/ This test is not yet approved or cleared  by the [place] FDA and has been authorized for detection and/or diagnosis of SARS-CoV-2 by FDA under an Emergency Use Authorization (EUA).  This EUA will remain in effect (meaning this test can be used) for the duration of the COVID-19 declaration under Section 564(b)(1) of the Act, 21 U.S.C. section 360bbb-3(b)(1), unless the authorization is terminated or revoked sooner. Performed at [facility], [address] [place], [place] [address]   MRSA PCR Screening     Status: None   Collection Time: 08/03/19  2:29 PM   Specimen: Nasopharyngeal  Result Value Ref Range Status   MRSA by PCR NEGATIVE NEGATIVE Final    Comment:        The GeneXpert MRSA Assay (FDA approved for NASAL specimens only), is one component of a comprehensive MRSA colonization surveillance program. It is not intended to diagnose MRSA infection nor to guide  or monitor treatment for MRSA infections. Performed at [facility], [address], [place], [place] [address]     Assessment/Plan: S/P Procedure(s) (LRB): CORONARY ARTERY BYPASS GRAFTING (CABG), ON PUMP, TIMES THREE, USING LEFT INTERNAL MAMMARY ARTERY AND ENDOSCOPICALLY HARVESTED LEFT GREATER SAPHENOUS VEIN (N/A) TRANSESOPHAGEAL ECHOCARDIOGRAM (TEE) (N/A)   1 Vitals remain stable except afib is tachy at times, conts to be in and out of afib, will need to determine [facility] RX risks/benefits/cost decision- conts amio for now . Hopeful the pulm findings on CXR are not evid of toxicity and is more pulm. edema pattern and respond to diuretics. Also got dig load, will order po .125 q day 3 weaning O2- sats now stable on 3 liters [place] 4 CXR , appears slightly improved 5 conts current diuretics, adeq UOP- renal fxn is stable 6 conts aggressive pulm toilet/RX, no leukocytosis or fevers, conts maxepime for poss pneumonia component. 7 BS adeq controlled for hospitalized patient, HG A1c is 6.0- no home meds 8 mobilize as able  LOS: 6 days    [place] E  [place] PAGER [phone] 08/09/2019    Chart reviewed, patient examined, agree with above. Breathing is stable. Oxygen requirement 3L [place]. CXR stable bilateral air space disease.  Rhythm was atrial flutter with RVR 120's and I rapid atrial paced to sinus 84. Continue amio, digoxin and add Lopressor.  I think he would benefit from anticoagulation for a while until sure he is maintaining sinus. Will need to decide about Coumadin vs Eliquis.

## 2019-08-09 NOTE — Progress Notes (Addendum)
Patient ambulated 450 feet in hallway with walker, he required a couple standing rest breaks, patient started ambulation on oxygen at  4L (3L at rest) but saturations dropped to low 80s and oxygen increased to 6L [place] and finished the walk on 6L, back in room patient placed on 3.5L [place] and oxygen remains 95-97%.Heart rate up to 140s on monitor while walking  Assisted to chair. Will monitor patient. Augusten Lipkin, Randall An RN

## 2019-08-10 LAB — BASIC METABOLIC PANEL
Anion gap: 12 (ref 5–15)
BUN: 26 mg/dL — ABNORMAL HIGH (ref 8–23)
CO2: 28 mmol/L (ref 22–32)
Calcium: 9.2 mg/dL (ref 8.9–10.3)
Chloride: 93 mmol/L — ABNORMAL LOW (ref 98–111)
Creatinine, Ser: 1.25 mg/dL — ABNORMAL HIGH (ref 0.61–1.24)
GFR calc Af Amer: >60 mL/min (ref >60)
GFR calc non Af Amer: >60 mL/min (ref >60)
Glucose, Bld: 121 mg/dL — ABNORMAL HIGH (ref 70–99)
Potassium: 4.9 mmol/L (ref 3.5–5.1)
Sodium: 133 mmol/L — ABNORMAL LOW (ref 135–145)

## 2019-08-10 LAB — GLUCOSE, CAPILLARY
Glucose-Capillary: 100 mg/dL — ABNORMAL HIGH (ref 70–99)
Glucose-Capillary: 101 mg/dL — ABNORMAL HIGH (ref 70–99)
Glucose-Capillary: 102 mg/dL — ABNORMAL HIGH (ref 70–99)

## 2019-08-10 MED ORDER — LEVALBUTEROL HCL 1.25 MG/0.5ML IN NEBU
1.2500 mg | INHALATION_SOLUTION | Freq: Two times a day (BID) | RESPIRATORY_TRACT | Status: DC
  Administered 2019-08-10 – 2019-08-15 (×10): 1.25 mg via RESPIRATORY_TRACT
  Filled 2019-08-10 (×10): qty 0.5

## 2019-08-10 MED ORDER — LEVALBUTEROL HCL 0.63 MG/3ML IN NEBU
INHALATION_SOLUTION | RESPIRATORY_TRACT | Status: AC
  Administered 2019-08-10: 0.63 mg
  Filled 2019-08-10: qty 3

## 2019-08-10 MED FILL — Potassium Chloride Inj 2 mEq/ML: INTRAVENOUS | Qty: 40 | Status: AC

## 2019-08-10 MED FILL — Magnesium Sulfate Inj 50%: INTRAMUSCULAR | Qty: 10 | Status: AC

## 2019-08-10 MED FILL — Heparin Sodium (Porcine) Inj 1000 Unit/ML: INTRAMUSCULAR | Qty: 30 | Status: AC

## 2019-08-10 NOTE — TOC Initial Note (Signed)
Transition of Care (TOC) - Initial/Assessment Note  Marvetta Gibbons RN, BSN Transitions of Care Unit 4E- RN Case Manager [phone]   Patient Details  Name: Matthew [name] MRN: [id number] Date of Birth: [birth year]  Transition of Care [facility]) CM/SW Contact:    Dawayne Patricia, RN Phone Number: 08/10/2019, 1:03 PM  Clinical Narrative:                 Pt s/p CABG, referral for DME/HH needs and benefits check on Eliquis- CM spoke with pt at bedside for transition of care needs- pt was concerned about hospital bill and discussed how billing works and how he can contact [facility] billing once he gets his bill after discharge to discuss payment options. Also discussed HH and DME needs- pt provided list for [facility] choice Per CMS guidelines from medicare.gov website with star ratings (copy placed in shadow chart)- per pt he does not have a preference for [facility] agency and states he will allow CM to find an appropriate agency on his behalf- pt also does not have preference for DME agency and is agreeable to using in house provider - [facility]- Per pt he will be staying with his ex-wife for a short while post discharge- she is also in [facility]- address is as follows: [address] [place], [place]  [address] Patient's phone [phone] Confirmed with pt his address in epic- per pt his is looking for a PCP- used to go to [place]- and plans to call to re-establish.   Per benefits check- pt does not show that he has drug coverage- discussed this with pt - per pt he used to have [employer]- however he no longer has that (changed in 2019)- and now just has Medicare A/B- pt thought he had a drug plan, but does not have a card for a drug plan in his wallet. Pt reports he was not on any medications prior to admission- he does have some prescription discount cards- but no insurance drug plan card. At this point in time it does not look like pt has Part D active drug plan (none found  in Medicare system). Pt will need to f/u with [facility] which is who he says helped him with his Arboriculturist. As for his Eliquis needs- he can be provided 30 days free with an Eliquis card- however is not eligible for any assistance after that- unless he applies for the Eliquis drug assistance program.   Call made to [facility] with [facility] for DME needs- RW and 3n1 to be delivered to room prior to discharge- home 02 will be arranged if pt meets qualifying guidelines under Medicare with portable tank being delivered to room prior to discharge.  Zach to see if pt qualifies for home 02  Call made to [facility] with [facility] for [facility] need- [facility] does service [facility] and can accept referral for [facility] needs.    Expected Discharge Plan: Price Barriers to Discharge: Continued Medical Work up   Patient Goals and CMS Choice Patient states their goals for this hospitalization and ongoing recovery are:: return home CMS Medicare.gov Compare Post Acute Care list provided to:: Patient Choice offered to / list presented to : Patient  Expected Discharge Plan and Services Expected Discharge Plan: [place]   Discharge Planning Services: CM Consult Post Acute Care Choice: Durable Medical Equipment, Home Health Living arrangements for the past 2 months: [place]  DME Arranged: 3-N-1, Walker rolling, Oxygen DME Agency: [facility] Date DME Agency Contacted: 08/10/19 Time DME Agency Contacted: 1302 Representative spoke with at DME Agency: Ian Malkin HH Arranged: RN HH Agency: [facility] Date HH Agency Contacted: 08/10/19 Time HH Agency Contacted: 1303 Representative spoke with at [facility] Agency: Cassie  Prior Living Arrangements/Services Living arrangements for the past 2 months: Single Family Home Lives with:: Self(will be staying with ex-wife on discharge) Patient language and need for interpreter reviewed::  Yes Do you feel safe going back to the place where you live?: Yes      Need for Family Participation in Patient Care: Yes (Comment) Care giver support system in place?: Yes (comment)   Criminal Activity/Legal Involvement Pertinent to Current Situation/Hospitalization: No - Comment as needed  Activities of Daily Living Home Assistive Devices/Equipment: None ADL Screening (condition at time of admission) Patient's cognitive ability adequate to safely complete daily activities?: Yes Is the patient deaf or have difficulty hearing?: No Does the patient have difficulty seeing, even when wearing glasses/contacts?: No Does the patient have difficulty concentrating, remembering, or making decisions?: No Patient able to express need for assistance with ADLs?: Yes Does the patient have difficulty dressing or bathing?: No Independently performs ADLs?: Yes (appropriate for developmental age) Does the patient have difficulty walking or climbing stairs?: No Weakness of Legs: None Weakness of Arms/Hands: None  Permission Sought/Granted Permission sought to share information with : Photographer granted to share info w AGENCY: HH and DME agencies        Emotional Assessment Appearance:: Appears stated age Attitude/Demeanor/Rapport: Engaged Affect (typically observed): Appropriate, Pleasant Orientation: : Oriented to Self, Oriented to Place, Oriented to  Time, Oriented to Situation Alcohol / Substance Use: Not Applicable Psych Involvement: No (comment)  Admission diagnosis:  ST elevation myocardial infarction (STEMI), unspecified artery (HCC) [I21.3] NSTEMI (non-ST elevated myocardial infarction) (HCC) [I21.4] Coronary artery disease [I25.10] Patient Active Problem List   Diagnosis Date Noted  . Paroxysmal atrial fibrillation (HCC)   . 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 Coronary artery disease 08/05/2019  . CAD S/P percutaneous coronary angioplasty of 100% OM1  08/04/2019  . Ischemic cardiomyopathy 08/04/2019  . Essential hypertension 08/04/2019  . History of stroke 08/04/2019  . Heavy smoker 1 PPD since age 57 08/04/2019  . Hyperlipidemia with target LDL less than 70 08/04/2019  . NSTEMI (non-ST elevated myocardial infarction) (HCC) 08/03/2019  . Multivessel coronary artery disease involving native coronary artery of native heart with unstable angina pectoris (HCC) 08/03/2019   PCP:  Patient, No Pcp Per Pharmacy:   [facility] [place], [place] - [address] [place] [address] Phone: [phone] Fax: [phone]  [facility] [place], [place] - [address] [place] [address] Phone: [phone] Fax: [phone]     Social Determinants of Health (SDOH) Interventions    Readmission Risk Interventions No flowsheet data found.

## 2019-08-10 NOTE — Progress Notes (Signed)
SATURATION QUALIFICATIONS: (This note is used to comply with regulatory documentation for home oxygen)  Patient Saturations on Room Air at Rest = 95%  Patient Saturations on Room Air while Ambulating = 64%  Patient Saturations on 2 Liters of oxygen while Ambulating = 97%  Please briefly explain why patient needs home oxygen: yes, pt needs oxygen, pt desats with ambulation

## 2019-08-10 NOTE — Progress Notes (Signed)
Mobility Specialist: Progress Note    08/10/19 1404  Mobility  Activity Ambulated in hall  Level of Assistance Contact guard assist, steadying assist  Assistive Device Front wheel walker  Distance Ambulated (ft) 1130 ft  Mobility Response Tolerated well  Mobility performed by Mobility specialist  Bed Position Chair  $Mobility charge 1 Mobility   Pre-Mobility: 71 HR, 107/61 BP, 92% SpO2 During Mobility: 83 HR, 94% SpO2  Post-Mobility: 78 HR, 108/61 BP, 92% SpO2  Pt tolerated ambulation well with 4L oxygen. Pt's monitor displayed sats from mid 60s to mid 70s with a bad pleth. Pulse Oximeter displayed sats in the low to mid 90s. Pt felt fine during ambulation. Pt was positioned in chair with phone and call bell within reach. Pt was put back on 4L of oxygen in room.   [facility] Day Mobility Specialist

## 2019-08-10 NOTE — Progress Notes (Addendum)
      [address]       [place] [address]             [phone]      5 Days Post-Op Procedure(s) (LRB): CORONARY ARTERY BYPASS GRAFTING (CABG), ON PUMP, TIMES THREE, USING LEFT INTERNAL MAMMARY ARTERY AND ENDOSCOPICALLY HARVESTED LEFT GREATER SAPHENOUS VEIN (N/A) TRANSESOPHAGEAL ECHOCARDIOGRAM (TEE) (N/A)   Subjective:  Up in chair.  Complains of soreness, has some shortness of breath at times.  He is coughing up sputum, which has cleared up  Objective: Vital signs in last 24 hours: Temp:  [97.7 F (36.5 C)-98.9 F (37.2 C)] 98 F (36.7 C) (05/19 0746) Pulse Rate:  [68-90] 75 (05/19 0746) Cardiac Rhythm: Normal sinus rhythm;Atrial fibrillation (05/19 0410) Resp:  [16-19] 17 (05/19 0746) BP: (95-117)/(60-76) 102/61 (05/19 0746) SpO2:  [90 %-100 %] 100 % (05/19 0746) Weight:  [87.6 kg] 87.6 kg (05/19 0429)  Intake/Output from previous day: 05/18 0701 - 05/19 0700 In: 880 [P.O.:480; IV Piggyback:400] Out: 2050 [Urine:2050]  General appearance: alert, cooperative and no distress Heart: regular rate and rhythm Lungs: wheezes bilaterally Abdomen: soft, non-tender; bowel sounds normal; no masses,  no organomegaly Extremities: edema trace Wound: clean and dry  Lab Results: Recent Labs    08/08/19 0351 08/09/19 0311  WBC 9.0 9.2  HGB 10.4* 10.2*  HCT 31.5* 30.4*  PLT 193 227   BMET:  Recent Labs    08/09/19 0311 08/10/19 0257  NA 135 133*  K 4.9 4.9  CL 99 93*  CO2 27 28  GLUCOSE 124* 121*  BUN 18 26*  CREATININE 1.19 1.25*  CALCIUM 8.8* 9.2    PT/INR: No results for input(s): LABPROT, INR in the last 72 hours. ABG    Component Value Date/Time   PHART 7.323 (L) 08/05/2019 1859   HCO3 25.4 08/05/2019 1859   TCO2 27 08/05/2019 1859   ACIDBASEDEF 1.0 08/05/2019 1859   O2SAT 93.0 08/05/2019 1859   CBG (last 3)  Recent Labs    08/09/19 1739 08/09/19 2106 08/10/19 0613  GLUCAP 99 125* 100*    Assessment/Plan: S/P Procedure(s)  (LRB): CORONARY ARTERY BYPASS GRAFTING (CABG), ON PUMP, TIMES THREE, USING LEFT INTERNAL MAMMARY ARTERY AND ENDOSCOPICALLY HARVESTED LEFT GREATER SAPHENOUS VEIN (N/A) TRANSESOPHAGEAL ECHOCARDIOGRAM (TEE) (N/A)  1. CV- PAF, currently in NSR- on Amiodarone, Lopressor.. d/c EPW 2. Pulm- COPD, weaning oxygen as tolerated, on oxygen at 3L and will require at discharge, will have nursing perform oxygen saturation requirement 3. Renal- creatinine 1.25, remains volume overloaded, on Lasix, Zaroxolyn, K is at 4.9 4. Expected post operative blood loss anemia, Hgb 10.2 5. GI- constipation, will add lactulose 6. Dispo- patient stable, continue pulmonary toilet, will arrange home oxygen, continue diuretics, D/C EPW... can hopefully d/c in the next 24-48 hours    LOS: 7 days    Lowella Dandy, PA-C  08/10/2019   Chart reviewed, patient examined, agree with above. He is making progress on POD 5. Wt is coming down but still probably 10 lbs over preop. Continue diuretic.   On Maxipime for bilateral fluffy air space disease on CXR. Will repeat in am.  He has maintained sinus 70's overnight since rapid paced out of A flutter. Continue amio, lopressor. Will investigate cost of Eliquis for him.

## 2019-08-10 NOTE — TOC Benefit Eligibility Note (Signed)
Transition of Care [facility]) Benefit Eligibility Note    Patient Details  Name: Matthew [name] MRN: [id number] Date of Birth: 1955/08/14   Medication/Dose: [name]  5 MC BID                       Additional Notes: NO PHARMACY  BENEFIT'S  ON [name] Phone Number: 08/10/2019, 9:53 AM

## 2019-08-10 NOTE — Progress Notes (Addendum)
CARDIAC REHAB PHASE I   PRE:  Rate/Rhythm: 75 SR  BP:  Sitting: 114/61      SaO2: 97 2L --> 95 RA  MODE:  Ambulation: 940 ft   POST:  Rate/Rhythm: 112 ST  BP:  Sitting: 112/57    SaO2: ?64 RA --> 97 2L  Pt ambulated 924ft in hallway standby assist with front wheel walker. Pt took several short standing rest breaks c/o SOB. Pt returned to room. Pt able to demonstrate 1000 on IS. Encouraged continued ambulation and IS use. Pt sats hard to read during ambulation, but sats on 2L 9& upon return to room. Pt requesting front wheel walker and BSC, CM made aware. Will continue to follow.  [id number] Reynold Bowen, RN BSN 08/10/2019 9:25 AM

## 2019-08-10 NOTE — Progress Notes (Signed)
   No atrial fibrillation/flutter since 10 PM last evening.  Patient is up and ambulating.

## 2019-08-10 NOTE — Progress Notes (Signed)
EPWs removed per protocol.  Pt tolerated well.  Instructed on 1Hr of bedrest with BP and HR monitoring.  Will continue to monitor.

## 2019-08-11 ENCOUNTER — Inpatient Hospital Stay (HOSPITAL_COMMUNITY): Payer: Medicare Other

## 2019-08-11 MED ORDER — WARFARIN SODIUM 5 MG PO TABS: 5.0000 mg | ORAL_TABLET | Freq: Every day | ORAL | Status: DC

## 2019-08-11 MED ORDER — METOLAZONE 5 MG PO TABS
2.5000 mg | ORAL_TABLET | Freq: Every day | ORAL | Status: AC
  Administered 2019-08-11 – 2019-08-12 (×2): 2.5 mg via ORAL
  Filled 2019-08-11 (×2): qty 1

## 2019-08-11 MED ORDER — DIGOXIN 0.25 MG/ML IJ SOLN
0.5000 mg | Freq: Once | INTRAMUSCULAR | Status: AC
  Administered 2019-08-11: 0.5 mg via INTRAVENOUS
  Filled 2019-08-11: qty 2

## 2019-08-11 MED ORDER — WARFARIN - PHYSICIAN DOSING INPATIENT: Freq: Every day | Status: DC

## 2019-08-11 MED ORDER — METOPROLOL TARTRATE 25 MG PO TABS
25.0000 mg | ORAL_TABLET | Freq: Two times a day (BID) | ORAL | Status: DC
  Administered 2019-08-11 – 2019-08-15 (×9): 25 mg via ORAL
  Filled 2019-08-11 (×8): qty 1

## 2019-08-11 MED ORDER — ASPIRIN EC 81 MG PO TBEC
81.0000 mg | DELAYED_RELEASE_TABLET | Freq: Every day | ORAL | Status: DC
  Administered 2019-08-11 – 2019-08-16 (×6): 81 mg via ORAL
  Filled 2019-08-11 (×6): qty 1

## 2019-08-11 MED ORDER — DIGOXIN 0.25 MG/ML IJ SOLN
0.2500 mg | Freq: Once | INTRAMUSCULAR | Status: AC
  Administered 2019-08-11: 0.25 mg via INTRAVENOUS
  Filled 2019-08-11: qty 1

## 2019-08-11 MED ORDER — METOPROLOL TARTRATE 25 MG PO TABS
37.5000 mg | ORAL_TABLET | Freq: Two times a day (BID) | ORAL | Status: DC
  Filled 2019-08-11: qty 1

## 2019-08-11 MED ORDER — DIGOXIN 125 MCG PO TABS
0.1250 mg | ORAL_TABLET | Freq: Every day | ORAL | Status: DC
  Administered 2019-08-12 – 2019-08-16 (×5): 0.125 mg via ORAL
  Filled 2019-08-11 (×5): qty 1

## 2019-08-11 MED ORDER — WARFARIN SODIUM 5 MG PO TABS
5.0000 mg | ORAL_TABLET | Freq: Once | ORAL | Status: AC
  Administered 2019-08-11: 5 mg via ORAL
  Filled 2019-08-11: qty 1

## 2019-08-11 MED ORDER — AMOXICILLIN-POT CLAVULANATE 500-125 MG PO TABS
1.0000 | ORAL_TABLET | Freq: Three times a day (TID) | ORAL | Status: DC
  Administered 2019-08-11 – 2019-08-16 (×17): 500 mg via ORAL
  Filled 2019-08-11 (×18): qty 1

## 2019-08-11 MED ORDER — FUROSEMIDE 40 MG PO TABS
40.0000 mg | ORAL_TABLET | Freq: Two times a day (BID) | ORAL | Status: DC
  Administered 2019-08-11 – 2019-08-14 (×6): 40 mg via ORAL
  Filled 2019-08-11 (×5): qty 1

## 2019-08-11 MED FILL — Albumin, Human Inj 5%: INTRAVENOUS | Qty: 250 | Status: AC

## 2019-08-11 MED FILL — Mannitol IV Soln 20%: INTRAVENOUS | Qty: 500 | Status: AC

## 2019-08-11 MED FILL — Heparin Sodium (Porcine) Inj 1000 Unit/ML: INTRAMUSCULAR | Qty: 10 | Status: AC

## 2019-08-11 MED FILL — Electrolyte-R (PH 7.4) Solution: INTRAVENOUS | Qty: 3000 | Status: AC

## 2019-08-11 MED FILL — Lidocaine HCl Local Soln Prefilled Syringe 100 MG/5ML (2%): INTRAMUSCULAR | Qty: 5 | Status: AC

## 2019-08-11 MED FILL — Sodium Chloride IV Soln 0.9%: INTRAVENOUS | Qty: 2000 | Status: AC

## 2019-08-11 MED FILL — Sodium Bicarbonate IV Soln 8.4%: INTRAVENOUS | Qty: 50 | Status: AC

## 2019-08-11 NOTE — Progress Notes (Signed)
CARDIAC REHAB PHASE I   PRE:  Rate/Rhythm:NSR  BP:  Supine:  Sitting: 108/64  Standing:   SaO2: 93 (4 L)  MODE:  Ambulation: 950 ft   POST:  Rate/Rhythem: NSR  BP:  Supine:   Sitting: 112/57  Standing:    SaO2: 96 (4 L)  13:20-14:20   Pt sitting at bedside agrees to walk. Pt stood by himself and used rolling walker for ambulation. Pt on 4 L/min via N/C. It is difficult to obtain a consistent SAO2 during ambulation due to low signal quality. SAO2 ranged from 82 to 91 but none of these readings were sustained.  Pt did stop back in room halfway to cough/cough up phlegm. He voices he felt better after doing so. Had no compliants with walking. Pt back to chair with brake on and call bell at side. I/S encouraged 10x/hr. Pt achieved 1500 ml.    Lorin Picket, MS, ACSM EP-C [facility] 08/11/2019  2:24 PM

## 2019-08-11 NOTE — Progress Notes (Signed)
Mobility Specialist: Progress Note    08/11/19 1739  Mobility  Activity Ambulated in hall  Level of Assistance Contact guard assist, steadying assist  Assistive Device Front wheel walker  Distance Ambulated (ft) 1050 ft  Mobility Response Tolerated well  Mobility performed by Mobility specialist  Bed Position Chair  $Mobility charge 1 Mobility   Pre-Mobility: 70 HR, 108/65 BP, 90% SpO2 During Mobility: 85 HR, 94% SpO2 Post-Mobility: 77 HR, 114/59 BP, 94% SpO2  Pt tolerated ambulation well on 4L/min via N/C. Pt endorsed feeling good during ambulation. Pt went 61ft without FW walker and did well. Pt ambulated w/FW walker the rest of the time. Pt was positioned back in chair with call bell and phone within reach.   [facility] Ramiyah Mcclenahan Mobility Specialist

## 2019-08-11 NOTE — Progress Notes (Addendum)
Pt converted to afib rate 110s around 2200. Currently sleeping in bed. Will continue to monitor.  2300- back to NSR about an hour later. Vitals charted.

## 2019-08-11 NOTE — Progress Notes (Addendum)
[address]       [place] [address]             [phone]      6 Days Post-Op Procedure(s) (LRB): CORONARY ARTERY BYPASS GRAFTING (CABG), ON PUMP, TIMES THREE, USING LEFT INTERNAL MAMMARY ARTERY AND ENDOSCOPICALLY HARVESTED LEFT GREATER SAPHENOUS VEIN (N/A) TRANSESOPHAGEAL ECHOCARDIOGRAM (TEE) (N/A)   Subjective:  Patient doing okay.  Back in Atrial Fibrillation this morning.  Objective: Vital signs in last 24 hours: Temp:  [97.7 F (36.5 C)-98.9 F (37.2 C)] 97.7 F (36.5 C) (05/20 0411) Pulse Rate:  [54-144] 81 (05/20 0411) Cardiac Rhythm: Normal sinus rhythm (05/19 1900) Resp:  [14-19] 16 (05/20 0411) BP: (90-116)/(48-76) 116/56 (05/20 0411) SpO2:  [92 %-100 %] 98 % (05/20 0411) Weight:  [88.4 kg] 88.4 kg (05/20 0411)  Intake/Output from previous day: 05/19 0701 - 05/20 0700 In: 560 [P.O.:360; IV Piggyback:200] Out: 2270 [Urine:2270]  General appearance: alert, cooperative and no distress Heart: irregularly irregular rhythm Lungs: clear to auscultation bilaterally Abdomen: soft, non-tender; bowel sounds normal; no masses,  no organomegaly Extremities: edema trace Wound: clean and dry  Lab Results: Recent Labs    08/09/19 0311  WBC 9.2  HGB 10.2*  HCT 30.4*  PLT 227   BMET:  Recent Labs    08/09/19 0311 08/10/19 0257  NA 135 133*  K 4.9 4.9  CL 99 93*  CO2 27 28  GLUCOSE 124* 121*  BUN 18 26*  CREATININE 1.19 1.25*  CALCIUM 8.8* 9.2    PT/INR: No results for input(s): LABPROT, INR in the last 72 hours. ABG    Component Value Date/Time   PHART 7.323 (L) 08/05/2019 1859   HCO3 25.4 08/05/2019 1859   TCO2 27 08/05/2019 1859   ACIDBASEDEF 1.0 08/05/2019 1859   O2SAT 93.0 08/05/2019 1859   CBG (last 3)  Recent Labs    08/10/19 0613 08/10/19 1121 08/10/19 1629  GLUCAP 100* 101* 102*    Assessment/Plan: S/P Procedure(s) (LRB): CORONARY ARTERY BYPASS GRAFTING (CABG), ON PUMP, TIMES THREE, USING LEFT INTERNAL  MAMMARY ARTERY AND ENDOSCOPICALLY HARVESTED LEFT GREATER SAPHENOUS VEIN (N/A) TRANSESOPHAGEAL ECHOCARDIOGRAM (TEE) (N/A)  1. CV- Atrial Fibrillation, rate in the 100s- on Amiodarone 400 mg BID, will increase Lopressor to 37.5 mg if BP allows... will start coumadin as patient doesn't have RX coverage 2. Pulm- COPD, weaning oxygen as tolerated, likely needs at discharge home use has been arranged 3. Renal- repeat BMET in AM, remains volume overload continue Lasix 4. ID- afebrile, on Maxipime for HCAP 5. Dispo- patient back in Atrial Fibrillation today, will increase Lopressor, start coumadin, repeat labs in AM, continue Maxipime.. not quite ready for d/c today   LOS: 8 days    Ellwood Handler, PA-C  08/11/2019   Chart reviewed, patient examined, agree with above. He is hemodynamically stable but back in AFib/flutter this am with rate 110-120. His pacing wires are out. His BP is on the lower side and he has reduced EF so will continue Lopressor at 25 and add digoxin for rate control. Check ECG in am for QTc.  Wt is unchanged although he was -1700 cc yesterday. He still has moderate LE edema and scrotal edema so will continue metolazone and lasix. Repeat BMET in am. Will reduced EF and postop pulmonary edema on CXR he needs to have edema decreased before going home. His CXR today looks much better. I suspect that the fluffy air space opacity on CXR postop was  edema and not pneumonia. His is on Maxipime but will switch to oral Augmentin for another week.

## 2019-08-11 NOTE — TOC Progression Note (Signed)
Transition of Care (TOC) - Progression Note    Patient Details  Name: Raju Bains MRN: [id number] Date of Birth: 11/17/1955  Transition of Care (TOC) CM/SW Contact  Stephanie C Correa, RN Phone Number: 08/11/2019, 12:42 PM  Clinical Narrative:    Not ready for discharge, continues to have issues with atrial fibrillation. O2 qualifications met, DME set up already with [facility] including [facility]2. Will continue to follow for any further needs.    Expected Discharge Plan: Home w Home Health Services Barriers to Discharge: Continued Medical Work up  Expected Discharge Plan and Services Expected Discharge Plan: Home w Home Health Services   Discharge Planning Services: CM Consult Post Acute Care Choice: Durable Medical Equipment, Home Health Living arrangements for the past 2 months: Single Family Home                 DME Arranged: 3-N-1, Walker rolling, Oxygen DME Agency: [facility] Date DME Agency Contacted: 08/10/19 Time DME Agency Contacted: 1302 Representative spoke with at DME Agency: Zach HH Arranged: RN HH Agency: [facility] Date HH Agency Contacted: 08/10/19 Time HH Agency Contacted: 1303 Representative spoke with at HH Agency: Cassie   Social Determinants of Health (SDOH) Interventions    Readmission Risk Interventions No flowsheet data found.  

## 2019-08-11 NOTE — Progress Notes (Signed)
   No PE recurrence of atrial fibrillation around 7:15 AM which is persisting.  Warfarin has been started and digoxin added.  He is tolerating the atrophia okay so therefore will not give additional IV amnio.  Drug drug interaction with both warfarin and digoxin in setting of amiodarone use.

## 2019-08-12 ENCOUNTER — Inpatient Hospital Stay (HOSPITAL_COMMUNITY): Payer: Medicare Other

## 2019-08-12 LAB — BASIC METABOLIC PANEL
Anion gap: 13 (ref 5–15)
BUN: 37 mg/dL — ABNORMAL HIGH (ref 8–23)
CO2: 30 mmol/L (ref 22–32)
Calcium: 9 mg/dL (ref 8.9–10.3)
Chloride: 86 mmol/L — ABNORMAL LOW (ref 98–111)
Creatinine, Ser: 1.31 mg/dL — ABNORMAL HIGH (ref 0.61–1.24)
GFR calc Af Amer: >60 mL/min (ref >60)
GFR calc non Af Amer: 58 mL/min — ABNORMAL LOW (ref >60)
Glucose, Bld: 118 mg/dL — ABNORMAL HIGH (ref 70–99)
Potassium: 3.5 mmol/L (ref 3.5–5.1)
Sodium: 129 mmol/L — ABNORMAL LOW (ref 135–145)

## 2019-08-12 LAB — PROTIME-INR
INR: 1.2 (ref 0.8–1.2)
Prothrombin Time: 14.6 seconds (ref 11.4–15.2)

## 2019-08-12 LAB — TSH: TSH: 13.454 u[IU]/mL — ABNORMAL HIGH (ref 0.350–4.500)

## 2019-08-12 MED ORDER — COLCHICINE 0.3 MG HALF TABLET
0.3000 mg | ORAL_TABLET | Freq: Two times a day (BID) | ORAL | Status: DC
  Administered 2019-08-12 – 2019-08-14 (×5): 0.3 mg via ORAL
  Filled 2019-08-12 (×7): qty 1

## 2019-08-12 MED ORDER — LEVOTHYROXINE SODIUM 100 MCG PO TABS
100.0000 ug | ORAL_TABLET | Freq: Every day | ORAL | Status: DC
  Administered 2019-08-13 – 2019-08-16 (×4): 100 ug via ORAL
  Filled 2019-08-12 (×4): qty 1

## 2019-08-12 MED ORDER — IBUPROFEN 400 MG PO TABS
400.0000 mg | ORAL_TABLET | Freq: Three times a day (TID) | ORAL | Status: AC
  Administered 2019-08-12 – 2019-08-13 (×5): 400 mg via ORAL
  Filled 2019-08-12 (×5): qty 1

## 2019-08-12 MED ORDER — WARFARIN SODIUM 5 MG PO TABS
5.0000 mg | ORAL_TABLET | Freq: Every day | ORAL | Status: AC
  Administered 2019-08-12 – 2019-08-16 (×5): 5 mg via ORAL
  Filled 2019-08-12 (×5): qty 1

## 2019-08-12 NOTE — Progress Notes (Addendum)
      [address]       [place] [address]             [phone]      7 Days Post-Op Procedure(s) (LRB): CORONARY ARTERY BYPASS GRAFTING (CABG), ON PUMP, TIMES THREE, USING LEFT INTERNAL MAMMARY ARTERY AND ENDOSCOPICALLY HARVESTED LEFT GREATER SAPHENOUS VEIN (N/A) TRANSESOPHAGEAL ECHOCARDIOGRAM (TEE) (N/A)   Subjective:  Patient complains of worsening chest discomfort and shortness of breath this morning.  He states he has been coughing a lot more than yesterday.  Objective: Vital signs in last 24 hours: Temp:  [97.8 F (36.6 C)-98.7 F (37.1 C)] 97.8 F (36.6 C) (05/21 0823) Pulse Rate:  [67-104] 68 (05/21 0823) Cardiac Rhythm: Normal sinus rhythm (05/21 0753) Resp:  [13-18] 16 (05/21 0823) BP: (90-115)/(55-77) 107/64 (05/21 0823) SpO2:  [90 %-100 %] 98 % (05/21 0823) Weight:  [87 kg] 87 kg (05/21 0306)  Intake/Output from previous day: 05/20 0701 - 05/21 0700 In: 840 [P.O.:840] Out: 3295 [Urine:3295]  General appearance: alert, cooperative and no distress Heart: regular rate and rhythm Lungs: wheezes bilaterally Abdomen: soft, non-tender; bowel sounds normal; no masses,  no organomegaly Extremities: edema trace Wound: clean and dry  Lab Results: No results for input(s): WBC, HGB, HCT, PLT in the last 72 hours. BMET:  Recent Labs    08/10/19 0257 08/12/19 0241  NA 133* 129*  K 4.9 3.5  CL 93* 86*  CO2 28 30  GLUCOSE 121* 118*  BUN 26* 37*  CREATININE 1.25* 1.31*  CALCIUM 9.2 9.0    PT/INR:  Recent Labs    08/12/19 0241  LABPROT 14.6  INR 1.2   ABG    Component Value Date/Time   PHART 7.323 (L) 08/05/2019 1859   HCO3 25.4 08/05/2019 1859   TCO2 27 08/05/2019 1859   ACIDBASEDEF 1.0 08/05/2019 1859   O2SAT 93.0 08/05/2019 1859   CBG (last 3)  Recent Labs    08/10/19 0613 08/10/19 1121 08/10/19 1629  GLUCAP 100* 101* 102*    Assessment/Plan: S/P Procedure(s) (LRB): CORONARY ARTERY BYPASS GRAFTING (CABG), ON PUMP,  TIMES THREE, USING LEFT INTERNAL MAMMARY ARTERY AND ENDOSCOPICALLY HARVESTED LEFT GREATER SAPHENOUS VEIN (N/A) TRANSESOPHAGEAL ECHOCARDIOGRAM (TEE) (N/A)  1. CV- PAF, currently NSR- continue Digoxin, Amiodarone, Lopressor... INR is 1.2, will repeat coumadin at 5 mg daily 2. Pulm- worsening shortness of breath this morning, + cough.. continue IS, aggressive nebs.. repeat CXR 3. Renal- creatinine remains stable, continue Lasix, metolazone 4. Hypothyroidism- TSH was >13, this was likely attributing to A. Fib, will increase TSH to 100 mcg daily, he will need to follow up with PCP 5. ID- afebrile, no leukocytosis, continue Augmentin 6. Dispo- patient stable, in NSR this morning, continue diuretics, get CXR continue nebs prn,    LOS: 9 days    Lowella Dandy, PA-C 08/12/2019   Pt seen and examined; agree with assessment and plan. Augment pain control Shundra Wirsing Z. Vickey Sages, MD [phone]

## 2019-08-12 NOTE — Progress Notes (Signed)
   No AF today.

## 2019-08-12 NOTE — Progress Notes (Signed)
CARDIAC REHAB PHASE I   PRE:  Rate/Rhythm: 64 SR  BP:  Sitting: 88/65      SaO2: 94 2L  MODE:  Ambulation: 200 ft   POST:  Rate/Rhythm: 76 SR  BP:  Sitting: 93/59 --> 104/64    SaO2: 91 3L  PT ambulated 250ft in hallway assist of one with front wheel walker. Pt took several rest breaks c/o fatigue. Pt returned to room and needed to sit inside doorway. Pt states a "sitting in a barrel" feeling with some dizziness. BP improved from prior to walk. Pt assisted back to bed with assistance of bedside nurse. Encouraged ambulation as able with emphasis on listening to symptoms.  [id number] Matthew Bowen, RN BSN 08/12/2019 2:01 PM

## 2019-08-12 NOTE — Progress Notes (Signed)
Mobility Specialist: Progress Note    08/12/19 1604  Mobility  Activity Refused mobility  Mobility performed by Mobility specialist  $Mobility charge 1 Mobility   Pt denied mobility. Pt states he is feeling sore and hasn't had a good [place]alaus Crooke overall. Pt was left in bed with nurse in room.   [facility] Zayne Marovich Mobility Specialist

## 2019-08-12 NOTE — Progress Notes (Signed)
CARDIAC REHAB PHASE I   Went to offer to walk with pt. Pt states he is in a lot of pain today, and it feels likes there is something moving. Pt states it is harder to talk today too. Sats 93 2L. RN made aware. Will f/u as able.  Reynold Bowen, RN BSN 08/12/2019 8:38 AM

## 2019-08-13 LAB — CBC
HCT: 29.9 % — ABNORMAL LOW (ref 39.0–52.0)
Hemoglobin: 10 g/dL — ABNORMAL LOW (ref 13.0–17.0)
MCH: 30.9 pg (ref 26.0–34.0)
MCHC: 33.4 g/dL (ref 30.0–36.0)
MCV: 92.3 fL (ref 80.0–100.0)
Platelets: 502 10*3/uL — ABNORMAL HIGH (ref 150–400)
RBC: 3.24 MIL/uL — ABNORMAL LOW (ref 4.22–5.81)
RDW: 14.4 % (ref 11.5–15.5)
WBC: 13.4 10*3/uL — ABNORMAL HIGH (ref 4.0–10.5)
nRBC: 0 % (ref 0.0–0.2)

## 2019-08-13 LAB — BASIC METABOLIC PANEL
Anion gap: 14 (ref 5–15)
BUN: 51 mg/dL — ABNORMAL HIGH (ref 8–23)
CO2: 33 mmol/L — ABNORMAL HIGH (ref 22–32)
Calcium: 9.7 mg/dL (ref 8.9–10.3)
Chloride: 85 mmol/L — ABNORMAL LOW (ref 98–111)
Creatinine, Ser: 1.45 mg/dL — ABNORMAL HIGH (ref 0.61–1.24)
GFR calc Af Amer: 59 mL/min — ABNORMAL LOW (ref >60)
GFR calc non Af Amer: 51 mL/min — ABNORMAL LOW (ref >60)
Glucose, Bld: 126 mg/dL — ABNORMAL HIGH (ref 70–99)
Potassium: 4.7 mmol/L (ref 3.5–5.1)
Sodium: 132 mmol/L — ABNORMAL LOW (ref 135–145)

## 2019-08-13 MED ORDER — MAGNESIUM CITRATE PO SOLN
0.5000 | Freq: Once | ORAL | Status: AC
  Administered 2019-08-13: 0.5 via ORAL
  Filled 2019-08-13: qty 296

## 2019-08-13 MED ORDER — ALUM & MAG HYDROXIDE-SIMETH 200-200-20 MG/5ML PO SUSP
15.0000 mL | Freq: Four times a day (QID) | ORAL | Status: DC | PRN
  Filled 2019-08-13: qty 30

## 2019-08-13 NOTE — Progress Notes (Signed)
CARDIAC REHAB PHASE I   PRE:  Rate/Rhythm: 55 SB    BP: sitting 92/70, standing 107/61    SaO2: 97 3L  MODE:  Ambulation: 470 ft   POST:  Rate/Rhythm: SR 66    BP: sitting 113/60     SaO2: 97 3L  1158-1250 Patient sitting in chair upon arrival to room. O2 @ 3L via [place]. Ambulated with RW independently. Steady gait noted. Patient had to be reminded to slow down. CR RN assisted with O2 tank and cardiac monitor. Post ambulation patient assisted back to bed. Discharge education reviewed with concentration on CHF and smoking cessation. OHS surgery booklet reviewed. Needs lots of reinforcement. No family at bedside as patient lives alone. Will go home with ex-wife as she will be primary caregiver x 1 week. Patient encouraged to keep feet elevated as moderated amt of lower ext edema noted. Patient also return demonstration on IS. Pulling 1250-1500. Encouraged frequent use as patient has dx pneumonia with elevated WBC. Patient reluctant to use secondary to "it makes me cough". RN attempted to re-education patient on pneumonia prevention, cough and deep breathing, use of pillow for sternal precautions. Patient states referral may be placed for phase 2 [facility] [place] however he is unsure if he will attend. External referral placed.  Nakeitha Milligan Hoover Brunette RN, BSN

## 2019-08-13 NOTE — Progress Notes (Signed)
[address]       [place] [address]             [phone]       8 Days Post-Op Procedure(s) (LRB): CORONARY ARTERY BYPASS GRAFTING (CABG), ON PUMP, TIMES THREE, USING LEFT INTERNAL MAMMARY ARTERY AND ENDOSCOPICALLY HARVESTED LEFT GREATER SAPHENOUS VEIN (N/A) TRANSESOPHAGEAL ECHOCARDIOGRAM (TEE) (N/A) Subjective: Awake and alert, no new complaints. Less cough but O2 sats ~88-90% on 3Lnc.  He is anxious to go home.   Home O2 has been arranged and delivered both to his home and to his hospital room.   No BM since surgery.   Objective: Vital signs in last 24 hours: Temp:  [97.4 F (36.3 C)-98.2 F (36.8 C)] 98.2 F (36.8 C) (05/22 0305) Pulse Rate:  [60-63] 60 (05/22 0305) Cardiac Rhythm: Sinus bradycardia (05/22 0817) Resp:  [11-18] 18 (05/22 0305) BP: (92-104)/(58-63) 92/63 (05/22 0305) SpO2:  [90 %-96 %] 96 % (05/22 0842) FiO2 (%):  [28 %] 28 % (05/22 0842) Weight:  [86.9 kg] 86.9 kg (05/22 0350)  Hemodynamic parameters for last 24 hours:    Intake/Output from previous day: 05/21 0701 - 05/22 0700 In: 240 [P.O.:240] Out: 1600 [Urine:1600] Intake/Output this shift: Total I/O In: -  Out: 500 [Urine:500]  General appearance: alert, appears stated age and mild distress Neurologic: intact Heart: Stable SR past 24 hours.  Lungs: Exp. wheeze R>L. CXR demonstrate infiltrate left mid and lower lung zone.  Extremities: LE incisios intract and dry. Minimal edema Wound: The sternotomy incision is intact and dry.   Lab Results: Recent Labs    08/13/19 0325  WBC 13.4*  HGB 10.0*  HCT 29.9*  PLT 502*   BMET:  Recent Labs    08/12/19 0241 08/13/19 0325  NA 129* 132*  K 3.5 4.7  CL 86* 85*  CO2 30 33*  GLUCOSE 118* 126*  BUN 37* 51*  CREATININE 1.31* 1.45*  CALCIUM 9.0 9.7    PT/INR:  Recent Labs    08/12/19 0241  LABPROT 14.6  INR 1.2   ABG    Component Value Date/Time   PHART 7.323 (L) 08/05/2019 1859   HCO3 25.4  08/05/2019 1859   TCO2 27 08/05/2019 1859   ACIDBASEDEF 1.0 08/05/2019 1859   O2SAT 93.0 08/05/2019 1859   CBG (last 3)  Recent Labs    08/10/19 1121 08/10/19 1629  GLUCAP 101* 102*   EXAM: PORTABLE CHEST 1 VIEW  COMPARISON:  Yesterday.  FINDINGS: Interval patchy opacity in the left mid and lower lung zones with obscuration of the left heart borders. No gross change in mild enlargement of the cardiac silhouette with post CABG changes. The are mildly prominent with further improvement. No definite pleural fluid seen. Diffuse osteopenia. Cervical spine fixation hardware.  IMPRESSION: 1. Interval probable pneumonia in the left mid and lower lung zones. 2. Further improved interstitial pulmonary edema with probable mild underlying chronic interstitial lung disease. 3. Stable mild cardiomegaly.   Electronically Signed   By: Beckie Salts M.D.   On: 08/12/2019 09:18   Assessment/Plan: S/P Procedure(s) (LRB): CORONARY ARTERY BYPASS GRAFTING (CABG), ON PUMP, TIMES THREE, USING LEFT INTERNAL MAMMARY ARTERY AND ENDOSCOPICALLY HARVESTED LEFT GREATER SAPHENOUS VEIN (N/A) TRANSESOPHAGEAL ECHOCARDIOGRAM (TEE) (N/A)  -POD8 CABG x 3. BP and cardiac rhythm stable.  Continue ASA, BB, statin.   -Post-op atrial fibrillation- Now in SR on oral amio and digoxin. Anticoagulating with Coumadin, INR 1.2 yesterday, no new lINR today. Will  check daily, continue at 5mg  for now.   -Pneumonia- Augmentin started yesterday. WBC trending up, monitor. Encouraging pulmonary hygiene.   -Constipation- no BM since surgery. Will order Mg Citrate this AM.   -Hypoxia- Appears comfortable on 3Lnc O2, sats ~90%.  Home O2 arranged.   -DVT PPX- continue Lovenox since INR not therapeutic.    LOS: 10 days    [name], [place] [phone] 08/13/2019

## 2019-08-13 NOTE — Progress Notes (Signed)
Pt ambulated x 1000 feet with front wheel walker, and 3 liters nasal cannula pt tolerated well

## 2019-08-14 LAB — PROTIME-INR
INR: 1.4 — ABNORMAL HIGH (ref 0.8–1.2)
Prothrombin Time: 16.4 seconds — ABNORMAL HIGH (ref 11.4–15.2)

## 2019-08-14 MED ORDER — AMIODARONE HCL 200 MG PO TABS
200.0000 mg | ORAL_TABLET | Freq: Every day | ORAL | Status: DC
  Administered 2019-08-15 – 2019-08-16 (×2): 200 mg via ORAL
  Filled 2019-08-14 (×2): qty 1

## 2019-08-14 MED ORDER — BISACODYL 10 MG RE SUPP
10.0000 mg | Freq: Every day | RECTAL | Status: DC | PRN
  Administered 2019-08-15: 10 mg via RECTAL
  Filled 2019-08-14 (×2): qty 1

## 2019-08-14 NOTE — Progress Notes (Signed)
Pt ambulated with front wheel walker x 1000 feet  With 3 liters nasal cannula, pt tolerated  well

## 2019-08-14 NOTE — Progress Notes (Signed)
      [address]       [place] [address]             [phone]       9 Days Post-Op Procedure(s) (LRB): CORONARY ARTERY BYPASS GRAFTING (CABG), ON PUMP, TIMES THREE, USING LEFT INTERNAL MAMMARY ARTERY AND ENDOSCOPICALLY HARVESTED LEFT GREATER SAPHENOUS VEIN (N/A) TRANSESOPHAGEAL ECHOCARDIOGRAM (TEE) (N/A) Subjective: Resting in bed, does not feel as well today. He was digitally disimpacted by his nurse earlier this AM then had a large formed stool after that.  Continues to have a productive cough and chest soreness.   Objective: Vital signs in last 24 hours: Temp:  [97.9 F (36.6 C)-98.2 F (36.8 C)] 97.9 F (36.6 C) (05/23 1149) Pulse Rate:  [60-62] 60 (05/23 1149) Cardiac Rhythm: Normal sinus rhythm (05/23 0738) Resp:  [14-18] 18 (05/23 1149) BP: (93-125)/(51-64) 108/61 (05/23 1149) SpO2:  [90 %-96 %] 90 % (05/23 1149) FiO2 (%):  [32 %] 32 % (05/23 0858) Weight:  [85.5 kg] 85.5 kg (05/23 0429)     Intake/Output from previous day: 05/22 0701 - 05/23 0700 In: 360 [P.O.:360] Out: 2125 [Urine:2125] Intake/Output this shift: No intake/output data recorded.  Physical Exam: General appearance: alert and moderate distress Neurologic: intact Heart: Stable Remains in SR Lungs: Exp. wheeze R>L. CXR 5/21 demonstrate infiltrate left mid and lower lung zone.  Extremities: LE incisions intract and dry. Minimal edema Wound: The sternotomy incision is intact and dry.   Lab Results: Recent Labs    08/13/19 0325  WBC 13.4*  HGB 10.0*  HCT 29.9*  PLT 502*   BMET:  Recent Labs    08/12/19 0241 08/13/19 0325  NA 129* 132*  K 3.5 4.7  CL 86* 85*  CO2 30 33*  GLUCOSE 118* 126*  BUN 37* 51*  CREATININE 1.31* 1.45*  CALCIUM 9.0 9.7    PT/INR:  Recent Labs    08/14/19 0439  LABPROT 16.4*  INR 1.4*   ABG    Component Value Date/Time   PHART 7.323 (L) 08/05/2019 1859   HCO3 25.4 08/05/2019 1859   TCO2 27 08/05/2019 1859   ACIDBASEDEF 1.0  08/05/2019 1859   O2SAT 93.0 08/05/2019 1859   CBG (last 3)  No results for input(s): GLUCAP in the last 72 hours.  Assessment/Plan: S/P Procedure(s) (LRB): CORONARY ARTERY BYPASS GRAFTING (CABG), ON PUMP, TIMES THREE, USING LEFT INTERNAL MAMMARY ARTERY AND ENDOSCOPICALLY HARVESTED LEFT GREATER SAPHENOUS VEIN (N/A) TRANSESOPHAGEAL ECHOCARDIOGRAM (TEE) (N/A)  -POD9 CABG x 3. BP and cardiac rhythm stable.  Continue ASA, BB, statin.   -Post-op atrial fibrillation- Now in SR on oral amio and digoxin. Amiodarone dosed at 400mg  BID for 7 days, will reduce to 200mg  daily. Anticoagulating with Coumadin, INR 1.4  continue at 5mg  for now.   -Pneumonia- Augmentin started 5/21. WBC was trending up. No new lab today.Encouraging pulmonary hygiene.   -Volume excess--difficult to determine which pre-op Wt is accurate. He does not appear significantly overloaded on exam. BUN up to 50.  Will hold off on further diuresis for now. Recheck lab in AM.  -Constipation-disimpacted this am then large BM.   -Hypoxia- Appears comfortable on 3Lnc O2, sats ~90%.  Home O2 arranged.   -DVT PPX- continue Lovenox since INR not therapeutic.    LOS: 11 days    , 6/21 08/14/2019

## 2019-08-14 NOTE — Progress Notes (Signed)
Pt uncomfortable constipated this morning.  Manually disimpacted after Pt struggled for 20 mins.  PRN suppository ordered and administered after disimpaction of small hard stool.  Pt then had success passing large formed stool

## 2019-08-15 ENCOUNTER — Inpatient Hospital Stay (HOSPITAL_COMMUNITY): Payer: Medicare Other

## 2019-08-15 LAB — BASIC METABOLIC PANEL
Anion gap: 13 (ref 5–15)
BUN: 31 mg/dL — ABNORMAL HIGH (ref 8–23)
CO2: 34 mmol/L — ABNORMAL HIGH (ref 22–32)
Calcium: 9.4 mg/dL (ref 8.9–10.3)
Chloride: 86 mmol/L — ABNORMAL LOW (ref 98–111)
Creatinine, Ser: 1.08 mg/dL (ref 0.61–1.24)
GFR calc Af Amer: >60 mL/min (ref >60)
GFR calc non Af Amer: >60 mL/min (ref >60)
Glucose, Bld: 118 mg/dL — ABNORMAL HIGH (ref 70–99)
Potassium: 3.9 mmol/L (ref 3.5–5.1)
Sodium: 133 mmol/L — ABNORMAL LOW (ref 135–145)

## 2019-08-15 LAB — CBC
HCT: 28.9 % — ABNORMAL LOW (ref 39.0–52.0)
Hemoglobin: 9.5 g/dL — ABNORMAL LOW (ref 13.0–17.0)
MCH: 30.4 pg (ref 26.0–34.0)
MCHC: 32.9 g/dL (ref 30.0–36.0)
MCV: 92.3 fL (ref 80.0–100.0)
Platelets: 524 10*3/uL — ABNORMAL HIGH (ref 150–400)
RBC: 3.13 MIL/uL — ABNORMAL LOW (ref 4.22–5.81)
RDW: 14.4 % (ref 11.5–15.5)
WBC: 13.3 10*3/uL — ABNORMAL HIGH (ref 4.0–10.5)
nRBC: 0 % (ref 0.0–0.2)

## 2019-08-15 LAB — PROTIME-INR
INR: 1.6 — ABNORMAL HIGH (ref 0.8–1.2)
Prothrombin Time: 18.3 seconds — ABNORMAL HIGH (ref 11.4–15.2)

## 2019-08-15 MED ORDER — LEVALBUTEROL HCL 1.25 MG/0.5ML IN NEBU: 1.2500 mg | INHALATION_SOLUTION | Freq: Four times a day (QID) | RESPIRATORY_TRACT | Status: DC | PRN

## 2019-08-15 MED ORDER — METOPROLOL TARTRATE 12.5 MG HALF TABLET
12.5000 mg | ORAL_TABLET | Freq: Two times a day (BID) | ORAL | Status: DC
  Administered 2019-08-16: 12.5 mg via ORAL
  Filled 2019-08-15 (×2): qty 1

## 2019-08-15 NOTE — Progress Notes (Signed)
CARDIOLOGY RECOMMENDATIONS:  Discharge is anticipated in the next 48 hours. Recommendations for medications and follow up:  Discharge Medications: Continue medications as they are currently listed in the [facility]. Exceptions to the above:  None.   Recommend checking a dig level on follow up  Follow Up: The patient's Primary Cardiologist is Garwin Brothers, MD  Follow up in the office in 2 week(s).  Signed,  Peter Swazil[place], MD  9:51 AM 08/15/2019  [facility]

## 2019-08-15 NOTE — Progress Notes (Signed)
CARDIAC REHAB PHASE I   PRE:  Rate/Rhythm: 59 SB  BP:  Sitting: 106/54      SaO2: 93 3L  MODE:  Ambulation: 940 ft   POST:  Rate/Rhythm: 79 SR  BP:  Sitting: 113/65    SaO2: 83 RA --> 93 3L  Pt ambulated 925ft in hallway standby assist with front wheel walker. Pt states he feels better than Friday. Tried to walk on RA, but pt not able to maintain sats on RA. New ambulating O2 note entered. Encouraged continued ambulation and IS use. Will continue to follow.   [id number] Reynold Bowen, RN BSN 08/15/2019 10:48 AM

## 2019-08-15 NOTE — Progress Notes (Signed)
Mobility Specialist - Progress Note   08/15/19 1437  Mobility  Activity Ambulated in hall  Level of Assistance Independent  Assistive Device None  Distance Ambulated (ft) 510 ft  Mobility Response Tolerated well  Mobility performed by Mobility specialist  Bed Position Chair  $Mobility charge 1 Mobility    Pre-mobility: 56 HR, 96% SpO2 During mobility: 73 HR, 98%SpO2 Post-mobility: 53 HR  Pt was able to walk independently on 3 L/min of O2. I was unable to get a SpO2 reading after ambulation. I left him in his bed with his call bell and phone in his lap.   Mamie Levers Mobility Specialist

## 2019-08-15 NOTE — Progress Notes (Signed)
SATURATION QUALIFICATIONS: (This note is used to comply with regulatory documentation for home oxygen)  Patient Saturations on Room Air at Rest = ?%  Patient Saturations on Room Air while Ambulating = 83%  Patient Saturations on 3 Liters of oxygen while Ambulating = 93%  Please briefly explain why patient needs home oxygen: Yes, pt needs oxygen as he is not able to maintain his sats during ambulation.

## 2019-08-15 NOTE — Progress Notes (Signed)
Came to room to check pt for prn bipap order.  Per pt he feels his breathing is "fine" and doesn't feel he needs bipap/cpap tonight. No distress currently noted, VSS.

## 2019-08-15 NOTE — Progress Notes (Signed)
10 Days Post-Op Procedure(s) (LRB): CORONARY ARTERY BYPASS GRAFTING (CABG), ON PUMP, TIMES THREE, USING LEFT INTERNAL MAMMARY ARTERY AND ENDOSCOPICALLY HARVESTED LEFT GREATER SAPHENOUS VEIN (N/A) TRANSESOPHAGEAL ECHOCARDIOGRAM (TEE) (N/A) Subjective: Feels ok. Had a rough time getting bowels moving.  Objective: Vital signs in last 24 hours: Temp:  [97.9 F (36.6 C)-98.4 F (36.9 C)] 98 F (36.7 C) (05/24 0742) Pulse Rate:  [57-64] 57 (05/24 0742) Cardiac Rhythm: Sinus bradycardia (05/24 0751) Resp:  [10-18] 12 (05/24 0742) BP: (99-114)/(57-66) 103/66 (05/24 0742) SpO2:  [90 %-95 %] 93 % (05/24 0744) FiO2 (%):  [32 %] 32 % (05/23 0858) Weight:  [83.9 kg] 83.9 kg (05/24 0603)  Hemodynamic parameters for last 24 hours:    Intake/Output from previous day: 05/23 0701 - 05/24 0700 In: 120 [P.O.:120] Out: 1700 [Urine:1700] Intake/Output this shift: No intake/output data recorded.  General appearance: alert and cooperative Neurologic: intact Heart: regular rate and rhythm, S1, S2 normal, no murmur Lungs: clear to auscultation bilaterally Abdomen: soft, non-tender; bowel sounds normal Extremities: no edema Wound: incisions ok  Lab Results: Recent Labs    08/13/19 0325 08/15/19 0330  WBC 13.4* 13.3*  HGB 10.0* 9.5*  HCT 29.9* 28.9*  PLT 502* 524*   BMET:  Recent Labs    08/13/19 0325 08/15/19 0330  NA 132* 133*  K 4.7 3.9  CL 85* 86*  CO2 33* 34*  GLUCOSE 126* 118*  BUN 51* 31*  CREATININE 1.45* 1.08  CALCIUM 9.7 9.4    PT/INR:  Recent Labs    08/15/19 0330  LABPROT 18.3*  INR 1.6*   ABG    Component Value Date/Time   PHART 7.323 (L) 08/05/2019 1859   HCO3 25.4 08/05/2019 1859   TCO2 27 08/05/2019 1859   ACIDBASEDEF 1.0 08/05/2019 1859   O2SAT 93.0 08/05/2019 1859   CBG (last 3)  No results for input(s): GLUCAP in the last 72 hours.  CLINICAL DATA:  Pneumonia  EXAM: CHEST - 2 VIEW  COMPARISON:  08/12/2019  FINDINGS: CABG. Improving  aeration in the left lung with decreasing airspace disease. Patchy opacities noted bilaterally. Small left effusion. No acute bony abnormality.  IMPRESSION: Improving left lung airspace disease. Continued patchy bilateral airspace opacities. Small left effusion.   Electronically Signed   By: Charlett Nose M.D.   On: 08/15/2019 07:14   Assessment/Plan: S/P Procedure(s) (LRB): CORONARY ARTERY BYPASS GRAFTING (CABG), ON PUMP, TIMES THREE, USING LEFT INTERNAL MAMMARY ARTERY AND ENDOSCOPICALLY HARVESTED LEFT GREATER SAPHENOUS VEIN (N/A) TRANSESOPHAGEAL ECHOCARDIOGRAM (TEE) (N/A)  POD 10 Hemodynamically stable in sinus rhythm.  Preop and postop atrial fib. Maintaining sinus rhythm now on digoxin, amio and Lopressor. Continue present doses.   Anticoagulated with Coumadin for AF. INR slowly rising on 5 mg daily.   Smoking and COPD, pneumonia vs pulmonary edema. CXR is clearing. Continue Augmentin, IS, nebs.  Volume excess: wt is 3 lbs over admission wt and no edema on exam. Lasix discontinued yesterday. Replacing K+  Hypothyroidism: new diagnosis since admission with high TSH. Started on 50 mg Synthroid and now increased to 100 mg. I would continue this dose at discharge and he needs followup TSH in 6 weeks.   Tentatively plan home tomorrow on oxygen if no changes.   LOS: 12 days    Alleen Borne 08/15/2019

## 2019-08-16 LAB — PROTIME-INR
INR: 1.6 — ABNORMAL HIGH (ref 0.8–1.2)
Prothrombin Time: 18.8 seconds — ABNORMAL HIGH (ref 11.4–15.2)

## 2019-08-16 LAB — DIGOXIN LEVEL: Digoxin Level: 1.2 ng/mL (ref 0.8–2.0)

## 2019-08-16 MED ORDER — ASPIRIN 81 MG PO TBEC: 81.0000 mg | DELAYED_RELEASE_TABLET | Freq: Every day | ORAL | Status: DC

## 2019-08-16 MED ORDER — FUROSEMIDE 40 MG PO TABS: 40.0000 mg | ORAL_TABLET | Freq: Every day | ORAL | 0 refills | Status: DC

## 2019-08-16 MED ORDER — METOPROLOL TARTRATE 25 MG PO TABS: 12.5000 mg | ORAL_TABLET | Freq: Two times a day (BID) | ORAL | 1 refills | Status: DC

## 2019-08-16 MED ORDER — ACETAMINOPHEN 325 MG PO TABS: 650.0000 mg | ORAL_TABLET | Freq: Four times a day (QID) | ORAL | Status: AC | PRN

## 2019-08-16 MED ORDER — DIGOXIN 125 MCG PO TABS: 0.1250 mg | ORAL_TABLET | Freq: Every day | ORAL | 1 refills | Status: DC

## 2019-08-16 MED ORDER — AMIODARONE HCL 200 MG PO TABS: 200.0000 mg | ORAL_TABLET | Freq: Every day | ORAL | 1 refills | Status: DC

## 2019-08-16 MED ORDER — POTASSIUM CHLORIDE CRYS ER 20 MEQ PO TBCR: 20.0000 meq | EXTENDED_RELEASE_TABLET | Freq: Every day | ORAL | 0 refills | Status: DC

## 2019-08-16 MED ORDER — FUROSEMIDE 40 MG PO TABS
40.0000 mg | ORAL_TABLET | Freq: Every day | ORAL | Status: DC
  Administered 2019-08-16: 40 mg via ORAL
  Filled 2019-08-16: qty 1

## 2019-08-16 MED ORDER — POTASSIUM CHLORIDE CRYS ER 20 MEQ PO TBCR
20.0000 meq | EXTENDED_RELEASE_TABLET | Freq: Every day | ORAL | Status: DC
  Administered 2019-08-16: 20 meq via ORAL
  Filled 2019-08-16: qty 1

## 2019-08-16 MED ORDER — AMOXICILLIN-POT CLAVULANATE 500-125 MG PO TABS: 1.0000 | ORAL_TABLET | Freq: Three times a day (TID) | ORAL | 0 refills | Status: DC

## 2019-08-16 MED ORDER — OXYCODONE HCL 5 MG PO TABS: 5.0000 mg | ORAL_TABLET | Freq: Four times a day (QID) | ORAL | 0 refills | Status: DC | PRN

## 2019-08-16 MED ORDER — LEVOTHYROXINE SODIUM 100 MCG PO TABS: 100.0000 ug | ORAL_TABLET | Freq: Every day | ORAL | 1 refills | Status: DC

## 2019-08-16 MED ORDER — ATORVASTATIN CALCIUM 80 MG PO TABS: 80.0000 mg | ORAL_TABLET | Freq: Every day | ORAL | 1 refills | Status: DC

## 2019-08-16 MED ORDER — WARFARIN SODIUM 5 MG PO TABS: 5.0000 mg | ORAL_TABLET | Freq: Every day | ORAL | 0 refills | Status: DC

## 2019-08-16 MED FILL — DIGOXIN 0.125 MG TABLET: 125 | 30 days supply | Qty: 30 | Fill #0

## 2019-08-16 MED FILL — oxyCODONE HCL 5 MG TABS: 5 | 7 days supply | Qty: 28 | Fill #0

## 2019-08-16 MED FILL — LEVOTHYROXINE SODIUM 100 MC: 100 | 30 days supply | Qty: 30 | Fill #0

## 2019-08-16 MED FILL — METOPROLOL TARTRATE 25 MG T: 25 | 30 days supply | Qty: 30 | Fill #0

## 2019-08-16 MED FILL — WARFARIN SODIUM 5 MG TABLET: 5 | 30 days supply | Qty: 30 | Fill #0

## 2019-08-16 MED FILL — AMIODARONE HCL 200 MG TAB: 200 | 30 days supply | Qty: 30 | Fill #0

## 2019-08-16 MED FILL — POTASSIUM CHLORIDE 20meqER: 20 | 5 days supply | Qty: 5 | Fill #0

## 2019-08-16 MED FILL — FUROSEMIDE 40 MG TABLET: 40 | 5 days supply | Qty: 5 | Fill #0

## 2019-08-16 MED FILL — ATORVASTATIN CALCIUM 80 MG: 80 | 30 days supply | Qty: 30 | Fill #0

## 2019-08-16 MED FILL — AMOX-CLAV 500-125 MG TABLET: 500-125 | 2 days supply | Qty: 8 | Fill #0

## 2019-08-16 NOTE — Progress Notes (Addendum)
      [address]       [place] [address]             [phone]        11 Days Post-Op Procedure(s) (LRB): CORONARY ARTERY BYPASS GRAFTING (CABG), ON PUMP, TIMES THREE, USING LEFT INTERNAL MAMMARY ARTERY AND ENDOSCOPICALLY HARVESTED LEFT GREATER SAPHENOUS VEIN (N/A) TRANSESOPHAGEAL ECHOCARDIOGRAM (TEE) (N/A)  Subjective: Patient without specific complaints this am. He wants to go home.  Objective: Vital signs in last 24 hours: Temp:  [98 F (36.7 C)-98.4 F (36.9 C)] 98.1 F (36.7 C) (05/25 0510) Pulse Rate:  [51-65] 58 (05/25 0510) Cardiac Rhythm: Normal sinus rhythm (05/24 1900) Resp:  [12-19] 13 (05/25 0510) BP: (99-119)/(55-68) 119/64 (05/25 0510) SpO2:  [91 %-98 %] 98 % (05/25 0510) Weight:  [83.7 kg] 83.7 kg (05/25 0500)  Pre op weight 81 kg Current Weight  08/16/19 83.7 kg       Intake/Output from previous day: 05/24 0701 - 05/25 0700 In: 720 [P.O.:720] Out: 1220 [Urine:1220]   Physical Exam:  Cardiovascular: RRR Pulmonary: Slightly diminished bibasilar breath sounds Abdomen: Soft, non tender, bowel sounds present. Extremities:Trace bilateral lower extremity edema. Wounds: Clean and dry.  No erythema or signs of infection.  Lab Results: CBC: Recent Labs    08/15/19 0330  WBC 13.3*  HGB 9.5*  HCT 28.9*  PLT 524*   BMET:  Recent Labs    08/15/19 0330  NA 133*  K 3.9  CL 86*  CO2 34*  GLUCOSE 118*  BUN 31*  CREATININE 1.08  CALCIUM 9.4    PT/INR:  Lab Results  Component Value Date   INR 1.6 (H) 08/16/2019   INR 1.6 (H) 08/15/2019   INR 1.4 (H) 08/14/2019   ABG:  INR: Will add last result for INR, ABG once components are confirmed Will add last 4 CBG results once components are confirmed  Assessment/Plan:  1. CV - Previous a fib. Maintaining SR,SB with HR in the 50's. On Amiodarone 200 mg daily, Digoxin 0.125 mg daily, Lopressor 12.5 mg bid, and Coumadin. INR remains 1.6 and Digoxin level 1.2 2.   Pulmonary - On 3 liters via [place]. Will discharge on home oxygen. Encourage incentive spirometer. 3. Volume Overload - Will give oral Lasix for a few more days 4.  Expected post op acute blood loss anemia - H and H yesterday stable 9.5 and 28.9. 5. ID-on Augmentin for possible pulmonary infection 6. Hypothyroidism-on Levothyroxine 100 mcg daily 7. Discharge;chest tube sutures will be removed in the office  Donielle M ZimmermanPA-C 08/16/2019,7:02 AM   Chart reviewed, patient examined, agree with above. He looks good and maintaining sinus rhythm. INR 1.6 on 5 mg daily of Coumadin. I would keep dose at 5 mg and adjust as outpt since he is on amiodarone. Still has mild pedal edema so would continue lasix for a few more days. Plan home today.

## 2019-08-16 NOTE — TOC Transition Note (Signed)
Transition of Care [facility]) - CM/SW Discharge Note Donn Pierini RN, BSN Transitions of Care Unit 4E- RN Case Manager [phone]   Patient Details  Name: Matthew [name] MRN: [id number] Date of Birth: 19-Feb-1956  Transition of Care [facility]) CM/SW Contact:  Darrold Span, RN Phone Number: 08/16/2019, 12:15 PM   Clinical Narrative:    Pt stable for transition home today- going to stay with ex-wife- who will transport home. DME has been delivered to room for transition home including home 02. Pt is stating that he can not afford his meds for discharge- pt is not eligible for [facility] assistance- does not have Medicare part D as pt did not sign up when he changed plans. Pt reports he does not get his SS check until next week on the 3rd. TOC pharmacy assisting with meds- total cost $74- spoke with Virgilio Frees supervisor regarding using petty cash- approval received- petty cash received from [facility] and provided to [facility] pharmacy to assist with pt's discharge meds.  Notified Cassie with [facility] for [facility] HHRN needs.      Final next level of care: Home w Home Health Services Barriers to Discharge: No Barriers Identified   Patient Goals and CMS Choice Patient states their goals for this hospitalization and ongoing recovery are:: return home CMS Medicare.gov Compare Post Acute Care list provided to:: Patient Choice offered to / list presented to : Patient  Discharge Placement               Home with [facility]        Discharge Plan and Services   Discharge Planning Services: CM Consult Post Acute Care Choice: Durable Medical Equipment, Home Health          DME Arranged: 3-N-1, Walker rolling, Oxygen DME Agency: [employer] Date DME Agency Contacted: 08/10/19 Time DME Agency Contacted: 1302 Representative spoke with at DME Agency: Ian Malkin HH Arranged: RN HH Agency: [facility] Date [facility] Agency Contacted: 08/10/19 Time HH Agency Contacted: 1303 Representative spoke  with at [facility] Agency: Cassie  Social Determinants of Health (SDOH) Interventions     Readmission Risk Interventions Readmission Risk Prevention Plan 08/16/2019  Transportation Screening Complete  PCP or Specialist Appt within 5-7 Days Complete  Home Care Screening Complete  Medication Review (RN CM) Complete

## 2019-08-16 NOTE — Care Management Important Message (Signed)
Important Message  Patient Details  Name: Matthew [name] MRN: [id number] Date of Birth: March 25, 1955   Medicare Important Message Given:  Yes     Renie Ora 08/16/2019, 12:04 PM

## 2019-08-16 NOTE — Progress Notes (Signed)
CARDIAC REHAB PHASE I   PRE:  Rate/Rhythm: 66 NSR  BP:  Sitting: 117/67      SaO2: 95 (3L)  MODE:  Ambulation: 860 ft   POST:  Rate/Rhythm: 72  BP:  Sitting: 134/67        SaO2: 96 (3L)  [id number] Pt in bed, agrees to ambulate. Pt used rolling walker and 3L O2 via n/c. Pt ambulates with steady gait using rolling walker. SaO2 ranged between 90-94% with walk. Max HR 83. Pt taken back to room, need to use bathroom. Pt had BM and voided. Assisted back to bed and left with call bell at side. Reinforced discharge education. Stressed safety with O2.  Encouraged use of I/S 10x/hr. Pt did 1250 ml on I/s. Pt referred to CRP2 @ [place].   Lorin Picket, MS, ACSM EP-C, [facility] 08/16/2019  8:39 AM

## 2019-08-16 NOTE — Plan of Care (Signed)
Dc instructions given to pt.  Pt verbalized understanding of all instructions.  No s/s of any acute distres.  Denies pain.  Pt now waiting on ride home.

## 2019-08-16 NOTE — Progress Notes (Signed)
Pt discharged with home oxygen, walker and 3n1. Patients son to escort him home.  Versie Starks, RN

## 2019-08-16 NOTE — Progress Notes (Signed)
Pt still waiting on ride.  Last spoke with pt's wife almost 2hr ago and she was on her way.  Attempted to call and check on ETA and no answer

## 2019-08-17 DIAGNOSIS — Z951 Presence of aortocoronary bypass graft: Secondary | ICD-10-CM | POA: Diagnosis not present

## 2019-08-17 DIAGNOSIS — Z48812 Encounter for surgical aftercare following surgery on the circulatory system: Secondary | ICD-10-CM

## 2019-08-19 ENCOUNTER — Ambulatory Visit (INDEPENDENT_AMBULATORY_CARE_PROVIDER_SITE_OTHER): Payer: Self-pay

## 2019-08-19 ENCOUNTER — Other Ambulatory Visit: Payer: Self-pay

## 2019-08-19 ENCOUNTER — Ambulatory Visit (INDEPENDENT_AMBULATORY_CARE_PROVIDER_SITE_OTHER): Payer: Medicare Other | Admitting: *Deleted

## 2019-08-19 VITALS — Temp 97.7°F

## 2019-08-19 DIAGNOSIS — Z951 Presence of aortocoronary bypass graft: Secondary | ICD-10-CM

## 2019-08-19 DIAGNOSIS — I480 Paroxysmal atrial fibrillation: Secondary | ICD-10-CM

## 2019-08-19 DIAGNOSIS — Z7901 Long term (current) use of anticoagulants: Secondary | ICD-10-CM

## 2019-08-19 DIAGNOSIS — Z4802 Encounter for removal of sutures: Secondary | ICD-10-CM

## 2019-08-19 DIAGNOSIS — Z5181 Encounter for therapeutic drug level monitoring: Secondary | ICD-10-CM | POA: Diagnosis not present

## 2019-08-19 LAB — POCT INR: INR: 2.1 (ref 2.0–3.0)

## 2019-08-19 NOTE — Patient Instructions (Addendum)
  A full discussion of the nature of anticoagulants has been carried out.  A benefit risk analysis has been presented to the patient, so that they understand the justification for choosing anticoagulation at this time. The need for frequent and regular monitoring, precise dosage adjustment and compliance is stressed.  Side effects of potential bleeding are discussed.  The patient should avoid any OTC items containing aspirin or ibuprofen, and should avoid great swings in general diet.  Avoid alcohol consumption.  Call if any signs of abnormal bleeding.   Description   Continue taking 1 tablet (5mg ) daily. Recheck in 1 week by Home Health-spoke with Debra RN [facility] and gave orders. Call [facility] [phone] with bleeding problems or new medications.

## 2019-08-19 NOTE — Progress Notes (Signed)
Pt comes in today for suture removal. He is a/o and without c/o. Three sutures in place to upper abdomen; incisions are well approximated and w/o s/s of infection. Sutures are easily removed per standard protocol. Instructed pt to keep sites clean (w/ mild soap and water) and dry. To notify office of any s/s of infection or other problems.

## 2019-08-23 ENCOUNTER — Telehealth (HOSPITAL_COMMUNITY): Payer: Self-pay

## 2019-08-23 ENCOUNTER — Ambulatory Visit: Payer: Medicare Other | Admitting: Cardiology

## 2019-08-23 NOTE — Telephone Encounter (Signed)
Faxed referral to [facility] for Cardiac Rehab.

## 2019-08-24 ENCOUNTER — Ambulatory Visit (INDEPENDENT_AMBULATORY_CARE_PROVIDER_SITE_OTHER): Payer: Medicare Other | Admitting: Cardiology

## 2019-08-24 DIAGNOSIS — Z7901 Long term (current) use of anticoagulants: Secondary | ICD-10-CM | POA: Diagnosis not present

## 2019-08-24 DIAGNOSIS — I480 Paroxysmal atrial fibrillation: Secondary | ICD-10-CM

## 2019-08-24 DIAGNOSIS — Z951 Presence of aortocoronary bypass graft: Secondary | ICD-10-CM

## 2019-08-24 LAB — POCT INR: INR: 2.7 (ref 2.0–3.0)

## 2019-08-25 ENCOUNTER — Telehealth: Payer: Self-pay | Admitting: Pharmacist

## 2019-08-25 DIAGNOSIS — Z7901 Long term (current) use of anticoagulants: Secondary | ICD-10-CM

## 2019-08-25 NOTE — Telephone Encounter (Signed)
michelle from [facility] stated that the pt called and informed her he passed BM with bright red blood in it sounds like hemrrhoids.... do you wanna hold warfarin?   Patient reports this is the 2nd episode of blood in stool this week and has hx of hemorrhoids.   Recommendation:  1. HOLD warfarin dose today ONLY, then resume stable warfarin dose.  2. Increase fiber in diet and fluid intake  3. Repeat INR next week as previously ordered, and draw CBC to check H/H  4. Call back if bleeding worsen.  5. PCP to follow up any issues with hemorrhoids.   Orders faxed to [facility] at [phone]

## 2019-08-29 ENCOUNTER — Telehealth: Payer: Self-pay

## 2019-08-29 NOTE — Telephone Encounter (Signed)
-----   Message from Oneal Grout sent at 08/29/2019  9:37 AM EDT ----- Patient is in hospital in [facility].  Thanks ----- Message ----- From: Davina Poke, RN Sent: 08/29/2019   9:16 AM EDT To: Gilmer Mor, #  Hey girls,   This patient called [facility] yesterday with lots of chest complaints s/p CABG with BKB.    Please schedule him today with PAs with a CXR per [facility].  Thanks, [employer]

## 2019-08-31 ENCOUNTER — Ambulatory Visit (INDEPENDENT_AMBULATORY_CARE_PROVIDER_SITE_OTHER): Payer: Medicare Other | Admitting: Pharmacist Clinician (PhC)/ Clinical Pharmacy Specialist

## 2019-08-31 DIAGNOSIS — Z7901 Long term (current) use of anticoagulants: Secondary | ICD-10-CM

## 2019-08-31 DIAGNOSIS — Z951 Presence of aortocoronary bypass graft: Secondary | ICD-10-CM

## 2019-08-31 DIAGNOSIS — Z8673 Personal history of transient ischemic attack (TIA), and cerebral infarction without residual deficits: Secondary | ICD-10-CM | POA: Diagnosis not present

## 2019-08-31 DIAGNOSIS — I480 Paroxysmal atrial fibrillation: Secondary | ICD-10-CM

## 2019-08-31 LAB — POCT INR: INR: 1.9 — AB (ref 2.0–3.0)

## 2019-09-02 ENCOUNTER — Other Ambulatory Visit: Payer: Self-pay

## 2019-09-02 ENCOUNTER — Ambulatory Visit (INDEPENDENT_AMBULATORY_CARE_PROVIDER_SITE_OTHER): Payer: Medicare Other | Admitting: Cardiology

## 2019-09-02 ENCOUNTER — Encounter: Payer: Self-pay | Admitting: Cardiology

## 2019-09-02 VITALS — BP 138/62 | HR 59 | Ht 67.5 in | Wt 184.0 lb

## 2019-09-02 DIAGNOSIS — F17210 Nicotine dependence, cigarettes, uncomplicated: Secondary | ICD-10-CM

## 2019-09-02 DIAGNOSIS — I255 Ischemic cardiomyopathy: Secondary | ICD-10-CM

## 2019-09-02 DIAGNOSIS — Z951 Presence of aortocoronary bypass graft: Secondary | ICD-10-CM

## 2019-09-02 DIAGNOSIS — E785 Hyperlipidemia, unspecified: Secondary | ICD-10-CM

## 2019-09-02 DIAGNOSIS — F17200 Nicotine dependence, unspecified, uncomplicated: Secondary | ICD-10-CM

## 2019-09-02 DIAGNOSIS — Z7901 Long term (current) use of anticoagulants: Secondary | ICD-10-CM

## 2019-09-02 DIAGNOSIS — I10 Essential (primary) hypertension: Secondary | ICD-10-CM

## 2019-09-02 DIAGNOSIS — I2510 Atherosclerotic heart disease of native coronary artery without angina pectoris: Secondary | ICD-10-CM | POA: Diagnosis not present

## 2019-09-02 DIAGNOSIS — I480 Paroxysmal atrial fibrillation: Secondary | ICD-10-CM

## 2019-09-02 DIAGNOSIS — Z8673 Personal history of transient ischemic attack (TIA), and cerebral infarction without residual deficits: Secondary | ICD-10-CM

## 2019-09-02 NOTE — Progress Notes (Signed)
Cardiology Office Note:    Date:  09/02/2019   ID:  Matthew [name], DOB April 11, 1955, MRN [id number]  PCP:  Patient, No Pcp Per  Cardiologist:  Garwin Brothers, MD   Referring MD: No ref. provider found    ASSESSMENT:    1. Coronary artery disease involving native coronary artery of native heart without angina pectoris   2. Essential hypertension   3. Hyperlipidemia with target LDL less than 70   4. Ischemic cardiomyopathy   5. Paroxysmal atrial fibrillation (HCC)   6. Heavy smoker 1 PPD since age 49   7. History of stroke   8. S/P CABG x 3   9. Long term (current) use of anticoagulants    PLAN:    In order of problems listed above:  1. Coronary artery disease: Post CABG surgery.  I discussed my findings with the patient at extensive length and secondary prevention stressed.  Importance of compliance with diet medication stressed and he vocalized understanding.  Sternotomy and graft harvest sites are healing well.  He has completed a course of antibiotics for pneumonia.  He got a call from his primary care provider that his white count is elevated and they are going to do blood work and chest x-ray and I called her primary car provider and discuss this with her at length. 2. Essential hypertension: Blood pressure stable 3. Mixed dyslipidemia: Diet was emphasized. 4. Paroxysmal atrial fibrillation:I discussed with the patient atrial fibrillation, disease process. Management and therapy including rate and rhythm control, anticoagulation benefits and potential risks were discussed extensively with the patient. Patient had multiple questions which were answered to patient's satisfaction. 5. I will continue amiodarone in view of the postoperative situation and the fact that he has not completely stabilized with this and had a recent pneumonitis. 6. Patient will be seen in follow-up appointment in 6 months or earlier if the patient has any concerns 7. Cigarette smoker: I spent 5 minutes with  the patient discussing solely about smoking. Smoking cessation was counseled. I suggested to the patient also different medications and pharmacological interventions. Patient is keen to try stopping on its own at this time. He will get back to me if he needs any further assistance in this matter.  Medication Adjustments/Labs and Tests Ordered: Current medicines are reviewed at length with the patient today.  Concerns regarding medicines are outlined above.  No orders of the defined types were placed in this encounter.  No orders of the defined types were placed in this encounter.    Chief Complaint  Patient presents with  . Follow-up    Hospital FU      History of Present Illness:    Matthew [name] is a 64 y.o. male.  Patient has past medical history of coronary artery disease.  He recently was in the emergency room with a STEMI and transferred to [facility].  He underwent CABG surgery.  The details are mentioned below.  Unfortunately continues to smoke.  At the time of my evaluation, the patient is alert awake oriented and in no distress.  He has history of essential hypertension and dyslipidemia.  Subsequently is also had paroxysmal fibrillation and is on anticoagulation and amiodarone therapy.  He was admitted with pneumonitis and treated and released.  At the time of my evaluation, the patient is alert awake oriented and in no distress.  Past Medical History:  Diagnosis Date  . Depression   . Essential hypertension 08/04/2019  . Heavy smoker 1 PPD  since age 4 08/04/2019  . History of stroke 08/04/2019  . Hyperlipidemia with target LDL less than 70 08/04/2019  . Hypertension   . Ischemic cardiomyopathy 08/03/2019   Echo: EF 35-40% with moderately decreased function.  Global HK.  GR 1 DD.  Elevated LAP.  Mildly elevated PA P estimated 39 mmHg.  Mild LA dilation.  Normal CVP/RAP  . Multivessel coronary artery disease involving native coronary artery of native heart with unstable  angina pectoris (HCC) 08/03/2019   Cardiac Cath-PTCA: Significant 3V CAD.  Culprit lesion- 100% mOM1 (PTCA only - 25%), ostial and prox LAD 90%-ostD1 80% (bifurcation), mid to distal RCA 60%.  EF 35-40%.  Moderately elevated LVEDP. -->  PTCA only performed to avoid DAPT requirement (no P2 Y 12 inhibitor).  CVTS consultation for CABG.  . NSTEMI (non-ST elevated myocardial infarction) (HCC) 08/03/2019  . Tobacco use     Past Surgical History:  Procedure Laterality Date  . BACK SURGERY    . CORONARY ANGIOPLASTY  08/03/2019   Dr. Romilda Joy: PTCA only 100% OM1 in setting of non-STEMI; plan for multivessel CABG  . CORONARY ARTERY BYPASS GRAFT N/A 08/05/2019   Procedure: CORONARY ARTERY BYPASS GRAFTING (CABG), ON PUMP, TIMES THREE, USING LEFT INTERNAL MAMMARY ARTERY AND ENDOSCOPICALLY HARVESTED LEFT GREATER SAPHENOUS VEIN;  Surgeon: Alleen Borne, MD;  Location: [facility] OR;  Service: Open Heart Surgery;  Laterality: N/A;  LIMA to LAD, SVG to DIAGONAL 1, SVG to OM1  . CORONARY BALLOON ANGIOPLASTY N/A 08/03/2019   Procedure: CORONARY BALLOON ANGIOPLASTY;  Surgeon: Iran Ouch, MD;  Location: [facility] INVASIVE CV LAB;  Service: Cardiovascular;  Laterality: N/A;  . LEFT HEART CATH AND CORONARY ANGIOGRAPHY  08/03/2019   Dr. Kirke Corin: Significant 3V CAD.  Culprit lesion- 100% mOM1 (PTCA only - 25%), ostial and prox LAD 90%-ostD1 80% (bifurcation), mid to distal RCA 60%.  EF 35-40%.  Moderately elevated LVEDP. -->  PTCA only performed to avoid DAPT requirement (no P2 Y 12 inhibitor).  CVTS consultation for CABG.   . LEFT HEART CATH AND CORONARY ANGIOGRAPHY N/A 08/03/2019   Procedure: LEFT HEART CATH AND CORONARY ANGIOGRAPHY;  Surgeon: Iran Ouch, MD;  Location: [facility] INVASIVE CV LAB;  Service: Cardiovascular;  Laterality: N/A;  . REPLACEMENT TOTAL KNEE    . TEE WITHOUT CARDIOVERSION N/A 08/05/2019   Procedure: TRANSESOPHAGEAL ECHOCARDIOGRAM (TEE);  Surgeon: Alleen Borne, MD;  Location: [facility] OR;  Service: Open  Heart Surgery;  Laterality: N/A;  . TRANSTHORACIC ECHOCARDIOGRAM  08/03/2019    EF 35-40% with moderately decreased function.  Global HK.  GR 1 DD.  Elevated LAP.  Mildly elevated PA P estimated 39 mmHg.  Mild LA dilation.  Normal CVP/RAP    Current Medications: No outpatient medications have been marked as taking for the 09/02/19 encounter (Office Visit) with Phuong Moffatt, Aundra Dubin, MD.     Allergies:   Patient has no known allergies.   Social History   Socioeconomic History  . Marital status: Married    Spouse name: Not on file  . Number of children: Not on file  . Years of education: Not on file  . Highest education level: Not on file  Occupational History  . Occupation: unemployed  Tobacco Use  . Smoking status: Current Every Day Smoker    Packs/day: 1.00    Years: 46.00    Pack years: 46.00    Types: Cigarettes  . Smokeless tobacco: Never Used  Substance and Sexual Activity  . Alcohol use: Not on  file  . Drug use: Never  . Sexual activity: Not on file  Other Topics Concern  . Not on file  Social History Narrative  . Not on file   Social Determinants of Health   Financial Resource Strain:   . Difficulty of Paying Living Expenses:   Food Insecurity:   . Worried About Charity fundraiser in the Last Year:   . Arboriculturist in the Last Year:   Transportation Needs:   . Film/video editor (Medical):   Marland Kitchen Lack of Transportation (Non-Medical):   Physical Activity:   . Days of Exercise per Week:   . Minutes of Exercise per Session:   Stress:   . Feeling of Stress :   Social Connections:   . Frequency of Communication with Friends and Family:   . Frequency of Social Gatherings with Friends and Family:   . Attends Religious Services:   . Active Member of Clubs or Organizations:   . Attends Archivist Meetings:   Marland Kitchen Marital Status:      Family History: The patient's family history includes Cancer in his father; Diabetes in his mother.  ROS:   Please  see the history of present illness.    All other systems reviewed and are negative.  EKGs/Labs/Other Studies Reviewed:    The following studies were reviewed today:  EKG done today reveals sinus rhythm poor anterior forces and nonspecific ST-T changes. Wellington Hampshire, MD (Primary)    Procedures  CORONARY BALLOON ANGIOPLASTY  LEFT HEART CATH AND CORONARY ANGIOGRAPHY  Conclusion    Mid RCA to Dist RCA lesion is 60% stenosed.  1st Mrg lesion is 100% stenosed.  Post intervention, there is a 25% residual stenosis.  Balloon angioplasty was performed using a BALLOON SAPPHIRE 2.0X12.  Ost LAD to Prox LAD lesion is 90% stenosed.  1st Diag lesion is 80% stenosed.  There is moderate left ventricular systolic dysfunction.  The left ventricular ejection fraction is 35-45% by visual estimate.  LV end diastolic pressure is moderately elevated.   1.  Significant three-vessel coronary artery disease.  The culprit for myocardial infarction is an occluded OM1.  There is complex bifurcation proximal LAD/diagonal disease with heavy calcifications and extension all the way back to the ostial LAD.  There is also 60% diffuse disease in the mid to distal right coronary artery. 2.  Moderately reduced LV systolic function with an EF of 35% with global hypokinesis but more pronounced in the inferior apical area.  Moderately elevated left ventricular end-diastolic pressure at 22 mmHg. 3.  Successful balloon angioplasty of OM1 which establish TIMI-3 flow.  Recommendations: I elected to treat the culprit OM1 occlusion in order to establish flow.  However, I did not place a stent to avoid the need for dual antiplatelet therapy.  The patient has complex calcified proximal LAD disease at the bifurcation of a large diagonal branch which is also disease.  This is best treated with CABG.  In addition, he has borderline diffuse disease in the mid to distal right coronary artery.  Recommend starting heparin  drip 4 hours after sheath pull. I consulted CVTS for CABG. the patient was not given a P2 Y 12 inhibitor. An echocardiogram was ordered.     Recent Labs: 08/04/2019: ALT 54 08/06/2019: Magnesium 2.3 08/12/2019: TSH 13.454 08/15/2019: BUN 31; Creatinine, Ser 1.08; Hemoglobin 9.5; Platelets 524; Potassium 3.9; Sodium 133  Recent Lipid Panel    Component Value Date/Time   CHOL 248 (H)  08/04/2019 0621   TRIG 128 08/04/2019 0621   HDL 62 08/04/2019 0621   CHOLHDL 4.0 08/04/2019 0621   VLDL 26 08/04/2019 0621   LDLCALC 160 (H) 08/04/2019 9412    Physical Exam:    VS:  There were no vitals taken for this visit.    Wt Readings from Last 3 Encounters:  08/16/19 184 lb 9.6 oz (83.7 kg)     GEN: Patient is in no acute distress HEENT: Normal NECK: No JVD; No carotid bruits LYMPHATICS: No lymphadenopathy CARDIAC: Hear sounds regular, 2/6 systolic murmur at the apex. RESPIRATORY:  Clear to auscultation without rales, wheezing or rhonchi  ABDOMEN: Soft, non-tender, non-distended MUSCULOSKELETAL:  No edema; No deformity  SKIN: Warm and dry NEUROLOGIC:  Alert and oriented x 3 PSYCHIATRIC:  Normal affect   Signed, Garwin Brothers, MD  09/02/2019 3:39 PM    [facility]

## 2019-09-02 NOTE — Patient Instructions (Signed)
Medication Instructions:  No medication changes. *If you need a refill on your cardiac medications before your next appointment, please call your pharmacy*   Lab Work: None ordered If you have labs (blood work) drawn today and your tests are completely normal, you will receive your results only by: . MyChart Message (if you have MyChart) OR . A paper copy in the mail If you have any lab test that is abnormal or we need to change your treatment, we will call you to review the results.   Testing/Procedures: None ordered   Follow-Up: At [facility], you and your health needs are our priority.  As part of our continuing mission to provide you with exceptional heart care, we have created designated Provider Care Teams.  These Care Teams include your primary Cardiologist (physician) and Advanced Practice Providers (APPs -  Physician Assistants and Nurse Practitioners) who all work together to provide you with the care you need, when you need it.  We recommend signing up for the patient portal called "MyChart".  Sign up information is provided on this After Visit Summary.  MyChart is used to connect with patients for Virtual Visits (Telemedicine).  Patients are able to view lab/test results, encounter notes, upcoming appointments, etc.  Non-urgent messages can be sent to your provider as well.   To learn more about what you can do with MyChart, go to [email or web address].    Your next appointment:   1 month(s)  The format for your next appointment:   In Person  Provider:   Rajan Revankar, MD   Other Instructions NA  

## 2019-09-06 ENCOUNTER — Other Ambulatory Visit: Payer: Self-pay | Admitting: Surgery

## 2019-09-06 DIAGNOSIS — Z951 Presence of aortocoronary bypass graft: Secondary | ICD-10-CM

## 2019-09-07 ENCOUNTER — Other Ambulatory Visit: Payer: Self-pay | Admitting: *Deleted

## 2019-09-07 ENCOUNTER — Encounter: Payer: Self-pay | Admitting: Surgery

## 2019-09-07 ENCOUNTER — Ambulatory Visit (INDEPENDENT_AMBULATORY_CARE_PROVIDER_SITE_OTHER): Payer: Self-pay | Admitting: Surgery

## 2019-09-07 ENCOUNTER — Other Ambulatory Visit: Payer: Self-pay

## 2019-09-07 ENCOUNTER — Ambulatory Visit
Admission: RE | Admit: 2019-09-07 | Discharge: 2019-09-07 | Disposition: A | Discharge status: Home / Self Care | Payer: Medicare Other | Source: Ambulatory Visit | Attending: Surgery | Admitting: Surgery

## 2019-09-07 ENCOUNTER — Ambulatory Visit (INDEPENDENT_AMBULATORY_CARE_PROVIDER_SITE_OTHER): Payer: Medicare Other | Admitting: Cardiovascular Disease

## 2019-09-07 VITALS — BP 122/73 | HR 65 | Temp 98.7°F | Resp 16 | Ht 67.0 in | Wt 184.0 lb

## 2019-09-07 DIAGNOSIS — I480 Paroxysmal atrial fibrillation: Secondary | ICD-10-CM | POA: Diagnosis not present

## 2019-09-07 DIAGNOSIS — Z7901 Long term (current) use of anticoagulants: Secondary | ICD-10-CM | POA: Diagnosis not present

## 2019-09-07 DIAGNOSIS — Z951 Presence of aortocoronary bypass graft: Secondary | ICD-10-CM

## 2019-09-07 DIAGNOSIS — R0789 Other chest pain: Secondary | ICD-10-CM

## 2019-09-07 DIAGNOSIS — I2510 Atherosclerotic heart disease of native coronary artery without angina pectoris: Secondary | ICD-10-CM

## 2019-09-07 LAB — POCT INR: INR: 4 — AB (ref 2.0–3.0)

## 2019-09-07 MED ORDER — OXYCODONE HCL 5 MG PO CAPS: 5.0000 mg | ORAL_CAPSULE | Freq: Four times a day (QID) | ORAL | 0 refills | Status: DC | PRN

## 2019-09-07 NOTE — Progress Notes (Signed)
HPI: Patient returns for routine postoperative follow-up having undergone coronary bypass graft surgery x3 on 08/05/2019. The patient's early postoperative recovery while in the hospital was notable for development of postoperative atrial fibrillation converted with amiodarone.  He had recurrence of atrial fibrillation that was difficult to control and he was sent home on amiodarone, metoprolol, and digoxin.  He was anticoagulated with Coumadin.  Since hospital discharge the patient was treated for pneumonia with Augmentin.  He returned to smoking after discharge and has continued to smoke 1 pack of cigarettes per day.  He said that over the past week or so he has had right-sided chest wall pain which is new.  He said that he took a dose of ibuprofen for pain because Tylenol was not working.  His INR today was 4.0.  He said he was told to hold his Coumadin for 1 day and then resume his previous dose.  His last INR on 08/31/2019 was 1.9.   Current Outpatient Medications  Medication Sig Dispense Refill  . acetaminophen (TYLENOL) 325 MG tablet Take 2 tablets (650 mg total) by mouth every 6 (six) hours as needed for mild pain.    Marland Kitchen amiodarone (PACERONE) 200 MG tablet Take 1 tablet (200 mg total) by mouth daily. 30 tablet 1  . amoxicillin-clavulanate (AUGMENTIN) 500-125 MG tablet Take 1 tablet (500 mg total) by mouth 3 (three) times daily. 8 tablet 0  . aspirin EC 81 MG EC tablet Take 1 tablet (81 mg total) by mouth daily.    Marland Kitchen atorvastatin (LIPITOR) 80 MG tablet Take 1 tablet (80 mg total) by mouth daily at 6 PM. 30 tablet 1  . furosemide (LASIX) 40 MG tablet Take 1 tablet (40 mg total) by mouth daily. For 5 days then stop. 5 tablet 0  . levothyroxine (SYNTHROID) 100 MCG tablet Take 1 tablet (100 mcg total) by mouth daily at 6 (six) AM. 30 tablet 1  . metoprolol tartrate (LOPRESSOR) 25 MG tablet Take 0.5 tablets (12.5 mg total) by mouth 2 (two) times daily. 30 tablet 1  . potassium chloride SA  (KLOR-CON) 20 MEQ tablet Take 1 tablet (20 mEq total) by mouth daily. For 5 days then stop. 5 tablet 0  . warfarin (COUMADIN) 5 MG tablet Take 1 tablet (5 mg total) by mouth daily at 4 PM. Or as directed 30 tablet 0  . oxyCODONE (OXY IR/ROXICODONE) 5 MG immediate release tablet Take 1 tablet (5 mg total) by mouth every 6 (six) hours as needed for severe pain. (Patient not taking: Reported on 09/07/2019) 28 tablet 0   No current facility-administered medications for this visit.    Physical Exam: BP 122/73 (BP Location: Right Arm, Patient Position: Sitting, Cuff Size: Normal)   Pulse 65   Temp 98.7 F (37.1 C)   Resp 16   Ht 5\' 7"  (1.702 m)   Wt 184 lb (83.5 kg)   SpO2 96% Comment: RA  BMI 28.82 kg/m  He looks well. Cardiac exam shows a regular rate and rhythm with normal heart sounds. Lungs are clear. The chest incision is healing well.  There is minimal pinkish discoloration in the lower third of the incision.  There is no sign of infection.  With coughing there is gross instability of the lower third of the sternum. There is no peripheral edema.  Diagnostic Tests:  CLINICAL DATA:  History of prior coronary bypass grafting  EXAM: CHEST - 2 VIEW  COMPARISON:  08/15/2019  FINDINGS: Cardiac shadow is stable.  Postsurgical changes are again noted. The lungs are well aerated bilaterally. Mild chronic interstitial changes are seen likely related to scarring. No focal infiltrate or sizable effusion is noted. No bony abnormality is seen.  IMPRESSION: Chronic changes without acute abnormality.   Electronically Signed   By: Inez Catalina M.D.   On: 09/07/2019 13:52   Impression:  He is about 1 month out from surgery and doing well clinically.  Unfortunately he has disrupted the lower portion of the sternum most likely related to heavy coughing from COPD, ongoing smoking and recent pneumonia.  Review of his last chest x-ray on 08/15/2019 showed that the sternal wires were  all in alignment in the midline.  His x-ray today shows the lower sternal wires displaced to the right consistent with the wires pulling through the sternum.  This will require sternal reconstruction and rewiring to achieve stability and prevent the remainder of the sternum from becoming disrupted.  His risk of further problems is obviously increased if he does not abstain from smoking.  I discussed this with him and his ex-wife.  He asked if I would refill his oxycodone prescription for pain since he is having increased pain related to the sternal instability and Tylenol is not working for him.  He cannot take ibuprofen due to being on Coumadin.  I wrote him another prescription today for oxycodone 5 mg p.o. every 6 hours as needed for pain #40.  Plan:  He will be scheduled for sternal reconstruction and rewiring on Tuesday, 09/20/2019.  I asked him to discontinue his Coumadin 5 days preoperatively.  He will notify us if he develops any change in the appearance of his chest incision or drainage from the chest.   Gaye Pollack, MD [facility] [phone]

## 2019-09-08 ENCOUNTER — Other Ambulatory Visit: Payer: Self-pay | Admitting: *Deleted

## 2019-09-08 ENCOUNTER — Encounter: Payer: Self-pay | Admitting: *Deleted

## 2019-09-08 DIAGNOSIS — R0789 Other chest pain: Secondary | ICD-10-CM

## 2019-09-14 ENCOUNTER — Ambulatory Visit (INDEPENDENT_AMBULATORY_CARE_PROVIDER_SITE_OTHER): Payer: Medicare Other | Admitting: Pharmacist

## 2019-09-14 DIAGNOSIS — I480 Paroxysmal atrial fibrillation: Secondary | ICD-10-CM | POA: Diagnosis not present

## 2019-09-14 DIAGNOSIS — Z951 Presence of aortocoronary bypass graft: Secondary | ICD-10-CM

## 2019-09-14 DIAGNOSIS — Z7901 Long term (current) use of anticoagulants: Secondary | ICD-10-CM | POA: Diagnosis not present

## 2019-09-14 LAB — POCT INR: INR: 3.2 — AB (ref 2.0–3.0)

## 2019-09-15 NOTE — Pre-Procedure Instructions (Signed)
[facility] - [place], [place] - [address] [place] [address] Phone: [phone] Fax: [phone]  [facility] - [place], [place] - [address] [place] [address] Phone: [phone] Fax: [phone]      Your procedure is scheduled on Tuesday June 29th.  Report to [facility] Main Entrance "A" at 5:30 A.M., and check in at the Admitting office.  Call this number if you have problems the morning of surgery:  [phone]  Call [phone] if you have any questions prior to your surgery date Monday-Friday 8am-4pm    Remember:  Do not eat or drink after midnight the night before your surgery     Take these medicines the morning of surgery with A SIP OF WATER  amiodarone (PACERONE) levothyroxine (SYNTHROID) metoprolol tartrate (LOPRESSOR)  oxycodone (OXY-IR) -if needed  Follow your surgeon's instructions on when to stop Asprin and warfarin (COUMADIN).  If no instructions were given by your surgeon then you will need to call the office to get those instructions.      As of today, STOP taking any Aspirin (unless otherwise instructed by your surgeon) and Aspirin containing products, Aleve, Naproxen, Ibuprofen, Motrin, Advil, Goody's, BC's, all herbal medications, fish oil, and all vitamins.                      Do not wear jewelry, make up, or nail polish            Do not wear lotions, powders, perfumes/colognes, or deodorant.            Do not shave 48 hours prior to surgery.  Men may shave face and neck.            Do not bring valuables to the hospital.            [facility] is not responsible for any belongings or valuables.  Do NOT Smoke (Tobacco/Vapping) or drink Alcohol 24 hours prior to your procedure If you use a CPAP at night, you may bring all equipment for your overnight stay.   Contacts, glasses, dentures or bridgework may not be worn into surgery.      For patients admitted  to the hospital, discharge time will be determined by your treatment team.   Patients discharged the day of surgery will not be allowed to drive home, and someone needs to stay with them for 24 hours.    Special instructions:   [place]- Preparing For Surgery  Before surgery, you can play an important role. Because skin is not sterile, your skin needs to be as free of germs as possible. You can reduce the number of germs on your skin by washing with CHG (chlorahexidine gluconate) Soap before surgery.  CHG is an antiseptic cleaner which kills germs and bonds with the skin to continue killing germs even after washing.    Oral Hygiene is also important to reduce your risk of infection.  Remember - BRUSH YOUR TEETH THE MORNING OF SURGERY WITH YOUR REGULAR TOOTHPASTE  Please do not use if you have an allergy to CHG or antibacterial soaps. If your skin becomes reddened/irritated stop using the CHG.  Do not shave (including legs and underarms) for at least 48 hours prior to first CHG shower. It is OK to shave your face.  Please follow these instructions carefully.   1. Shower the NIGHT BEFORE SURGERY and the MORNING OF  SURGERY with CHG Soap.   2. If you chose to wash your hair, wash your hair first as usual with your normal shampoo.  3. After you shampoo, rinse your hair and body thoroughly to remove the shampoo.  4. Use CHG as you would any other liquid soap. You can apply CHG directly to the skin and wash gently with a scrungie or a clean washcloth.   5. Apply the CHG Soap to your body ONLY FROM THE NECK DOWN.  Do not use on open wounds or open sores. Avoid contact with your eyes, ears, mouth and genitals (private parts). Wash Face and genitals (private parts)  with your normal soap.   6. Wash thoroughly, paying special attention to the area where your surgery will be performed.  7. Thoroughly rinse your body with warm water from the neck down.  8. DO NOT shower/wash with your normal  soap after using and rinsing off the CHG Soap.  9. Pat yourself dry with a CLEAN TOWEL.  10. Wear CLEAN PAJAMAS to bed the night before surgery, wear comfortable clothes the morning of surgery  11. Place CLEAN SHEETS on your bed the night of your first shower and DO NOT SLEEP WITH PETS.   Day of Surgery:   Do not apply any deodorants/lotions.  Please wear clean clothes to the hospital/surgery center.   Remember to brush your teeth WITH YOUR REGULAR TOOTHPASTE.   Please read over the following fact sheets that you were given.

## 2019-09-16 ENCOUNTER — Encounter (HOSPITAL_COMMUNITY)
Admission: RE | Admit: 2019-09-16 | Discharge: 2019-09-16 | Disposition: A | Discharge status: Home / Self Care | Payer: Medicare Other | Source: Ambulatory Visit | Attending: Surgery | Admitting: Surgery

## 2019-09-16 ENCOUNTER — Ambulatory Visit (HOSPITAL_COMMUNITY)
Admission: RE | Admit: 2019-09-16 | Discharge: 2019-09-16 | Disposition: A | Discharge status: Home / Self Care | Payer: Medicare Other | Source: Ambulatory Visit | Attending: Surgery | Admitting: Surgery

## 2019-09-16 ENCOUNTER — Other Ambulatory Visit (HOSPITAL_COMMUNITY)
Admission: RE | Admit: 2019-09-16 | Discharge: 2019-09-16 | Disposition: A | Discharge status: Home / Self Care | Payer: Medicare Other | Source: Ambulatory Visit | Attending: Surgery | Admitting: Surgery

## 2019-09-16 ENCOUNTER — Encounter (HOSPITAL_COMMUNITY): Payer: Self-pay

## 2019-09-16 ENCOUNTER — Other Ambulatory Visit: Payer: Self-pay

## 2019-09-16 DIAGNOSIS — R0789 Other chest pain: Secondary | ICD-10-CM | POA: Insufficient documentation

## 2019-09-16 DIAGNOSIS — Z20822 Contact with and (suspected) exposure to covid-19: Secondary | ICD-10-CM | POA: Insufficient documentation

## 2019-09-16 DIAGNOSIS — Z01818 Encounter for other preprocedural examination: Secondary | ICD-10-CM | POA: Diagnosis not present

## 2019-09-16 HISTORY — DX: Dyspnea, unspecified: R06.00

## 2019-09-16 HISTORY — DX: Burn of first degree of unspecified lower leg, initial encounter: T24.139A

## 2019-09-16 LAB — COMPREHENSIVE METABOLIC PANEL
ALT: 13 U/L (ref 0–44)
AST: 16 U/L (ref 15–41)
Albumin: 3.3 g/dL — ABNORMAL LOW (ref 3.5–5.0)
Alkaline Phosphatase: 92 U/L (ref 38–126)
Anion gap: 11 (ref 5–15)
BUN: 11 mg/dL (ref 8–23)
CO2: 19 mmol/L — ABNORMAL LOW (ref 22–32)
Calcium: 9 mg/dL (ref 8.9–10.3)
Chloride: 102 mmol/L (ref 98–111)
Creatinine, Ser: 0.9 mg/dL (ref 0.61–1.24)
GFR calc Af Amer: >60 mL/min (ref >60)
GFR calc non Af Amer: >60 mL/min (ref >60)
Glucose, Bld: 102 mg/dL — ABNORMAL HIGH (ref 70–99)
Potassium: 3.9 mmol/L (ref 3.5–5.1)
Sodium: 132 mmol/L — ABNORMAL LOW (ref 135–145)
Total Bilirubin: 0.4 mg/dL (ref 0.3–1.2)
Total Protein: 6.7 g/dL (ref 6.5–8.1)

## 2019-09-16 LAB — CBC
HCT: 35.2 % — ABNORMAL LOW (ref 39.0–52.0)
Hemoglobin: 11.6 g/dL — ABNORMAL LOW (ref 13.0–17.0)
MCH: 30 pg (ref 26.0–34.0)
MCHC: 33 g/dL (ref 30.0–36.0)
MCV: 91 fL (ref 80.0–100.0)
Platelets: 353 10*3/uL (ref 150–400)
RBC: 3.87 MIL/uL — ABNORMAL LOW (ref 4.22–5.81)
RDW: 13.4 % (ref 11.5–15.5)
WBC: 8.4 10*3/uL (ref 4.0–10.5)
nRBC: 0 % (ref 0.0–0.2)

## 2019-09-16 LAB — URINALYSIS, ROUTINE W REFLEX MICROSCOPIC
Bilirubin Urine: NEGATIVE
Glucose, UA: NEGATIVE mg/dL
Hgb urine dipstick: NEGATIVE
Ketones, ur: NEGATIVE mg/dL
Leukocytes,Ua: NEGATIVE
Nitrite: NEGATIVE
Protein, ur: NEGATIVE mg/dL
Specific Gravity, Urine: 1.006 (ref 1.005–1.030)
pH: 6 (ref 5.0–8.0)

## 2019-09-16 LAB — PROTIME-INR
INR: 1.5 — ABNORMAL HIGH (ref 0.8–1.2)
Prothrombin Time: 17.5 seconds — ABNORMAL HIGH (ref 11.4–15.2)

## 2019-09-16 LAB — SURGICAL PCR SCREEN
MRSA, PCR: NEGATIVE
Staphylococcus aureus: NEGATIVE

## 2019-09-16 LAB — BLOOD GAS, ARTERIAL
Acid-base deficit: 1.8 mmol/L (ref 0.0–2.0)
Bicarbonate: 22.2 mmol/L (ref 20.0–28.0)
Drawn by: 421801
FIO2: 21
O2 Saturation: 98.1 %
Patient temperature: 37
pCO2 arterial: 35.8 mmHg (ref 32.0–48.0)
pH, Arterial: 7.409 (ref 7.350–7.450)
pO2, Arterial: 97.1 mmHg (ref 83.0–108.0)

## 2019-09-16 LAB — SARS CORONAVIRUS 2 (TAT 6-24 HRS): SARS Coronavirus 2: NEGATIVE

## 2019-09-16 LAB — TYPE AND SCREEN
ABO/RH(D): A POS
Antibody Screen: NEGATIVE

## 2019-09-16 LAB — APTT: aPTT: 34 seconds (ref 24–36)
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
## 2019-09-19 NOTE — H&P (Signed)
[address]       [place] [address]             [phone]      Cardiothoracic Surgery Admission History and Physical   Patient is a 64 year old history of hypertension, hyperlipidemia, and ongoing heavy tobacco abuse who presented to [facility] emergency room and electrocardiogram showed minimal lateral ST elevation.  His troponin was elevated at 5.98.  He had continued chest pain and was transferred to [facility] for cardiac catheterization which showed severe three-vessel coronary disease.  The culprit lesion was an occluded large first marginal branch which was successfully opened with balloon angioplasty.  No stent was placed since the patient had severe three-vessel coronary disease and surgery was felt to be the best treatment.  There is also a 90% ostial to proximal LAD stenosis.  There is a large first diagonal and 80% stenosis.  The right coronary artery was diffusely diseased with segmental 60% mid to distal vessel stenosis.  Left ventricular ejection fraction of 35 to 45% by visual estimate with a moderately elevated LVEDP of 21.  A 2D echocardiogram today showed an ejection fraction of 35 to 40% with global hypokinesis.  The left ventricle is mildly dilated.  There is grade 1 diastolic dysfunction.  RV function is normal.  There was mild mitral regurgitation and mild aortic insufficiency. He underwent coronary bypass graft surgery x3 on 08/05/2019. He had a left internal mammary graft to the LAD and saphenous vein graft to the diagonal branch and obtuse marginal branch. His postoperative course was complicated by atrial fibrillation with rapid ventricular response treated with amiodarone. His rate was not well controlled he was started on digoxin. His atrial fibrillation persisted and he was started on anticoagulation with Coumadin. He also required BiPAP initially for respiratory difficulty due to prolonged active tobacco abuse and COPD. Home oxygen was arranged.  Since going home he was treated for pneumonia with Augmentin. He returned to smoking after discharge and when I saw him back in the office on 09/07/2019 he reported that he was still smoking 1 pack of cigarettes per day. Over the week prior he developed some right-sided chest pain. On exam he was noted to have instability of the lower half of the sternum. The incision was healing well with no signs of infection although there was minimal pinkish discoloration at the lower third of the incision.  The patient is on disability due to multiple orthopedic surgeries in the past.  He lives at home with his wife and so that he still remains active mowing lawns prior to his heart surgery.   Past Medical History:  Diagnosis Date  . Depression   . Hyperlipidemia   . Hypertension   . Tobacco use          Past Surgical History:  Procedure Laterality Date  . BACK SURGERY    . REPLACEMENT TOTAL KNEE    CABG x 3   08/05/2019       Family History  Problem Relation Age of Onset  . Diabetes Mother   . Cancer Father     Social History:  reports that he has been smoking cigarettes. He has a 46.00 pack-year smoking history. He has never used smokeless tobacco. He reports that he does not use drugs. No history on file for alcohol.  Allergies: No Known Allergies  Medications:  I have reviewed the patient's current medications. Prior to Admission:    Current Outpatient  Medications  Medication Sig Dispense Refill  . acetaminophen (TYLENOL) 325 MG tablet Take 2 tablets (650 mg total) by mouth every 6 (six) hours as needed for mild pain.    Marland Kitchen amiodarone (PACERONE) 200 MG tablet Take 1 tablet (200 mg total) by mouth daily. 30 tablet 1  . amoxicillin-clavulanate (AUGMENTIN) 500-125 MG tablet Take 1 tablet (500 mg total) by mouth 3 (three) times daily. 8 tablet 0  . aspirin EC 81 MG EC tablet Take 1 tablet (81 mg total) by mouth daily.    Marland Kitchen atorvastatin (LIPITOR) 80 MG tablet Take 1  tablet (80 mg total) by mouth daily at 6 PM. 30 tablet 1  . furosemide (LASIX) 40 MG tablet Take 1 tablet (40 mg total) by mouth daily. For 5 days then stop. 5 tablet 0  . levothyroxine (SYNTHROID) 100 MCG tablet Take 1 tablet (100 mcg total) by mouth daily at 6 (six) AM. 30 tablet 1  . metoprolol tartrate (LOPRESSOR) 25 MG tablet Take 0.5 tablets (12.5 mg total) by mouth 2 (two) times daily. 30 tablet 1  . potassium chloride SA (KLOR-CON) 20 MEQ tablet Take 1 tablet (20 mEq total) by mouth daily. For 5 days then stop. 5 tablet 0  . warfarin (COUMADIN) 5 MG tablet Take 1 tablet (5 mg total) by mouth daily at 4 PM. Or as directed 30 tablet 0  . oxyCODONE (OXY IR/ROXICODONE) 5 MG immediate release tablet Take 1 tablet (5 mg total) by mouth every 6 (six) hours as needed for severe pain. (Patient not taking: Reported on 09/07/2019) 28 tablet 0   No current facility-administered medications for this visit.     Review of Systems  Constitutional: Negative  HENT: Negative.   Eyes: Negative.   Respiratory: Positive for right sided chest wall pain Cardiovascular: Positive for right sided chest pain. Negative for palpitations and leg swelling.  Gastrointestinal: negative Endocrine: Negative.   Genitourinary: Negative.   Musculoskeletal: Positive for back pain and neck pain.  Skin: Negative.   Allergic/Immunologic: Negative.   Neurological: Negative for dizziness.  Hematological: Negative.   Psychiatric/Behavioral: Negative.    Blood pressure 130/69, pulse  58, temperature 97.7 F (36.5 C), temperature source Oral, resp. rate 13, height 5' 7.5" (1.715 m), weight 82.6 kg, SpO2 98 %. Physical Exam  Constitutional: He appears well-developed and well-nourished. No distress.  HENT:  Head: Normocephalic and atraumatic.  Eyes: Pupils are equal, round, and reactive to light. Conjunctivae and EOM are normal.  Neck: No JVD present.  Cardiovascular: Normal rate, regular rhythm and normal heart  sounds.   No murmur heard.                  Sternotomy incision healing well. Sternum grossly unstable with coughing. Respiratory: Effort normal. No respiratory distress. He has wheezes.  GI: Soft. Bowel sounds are normal. He exhibits no distension. There is no abdominal tenderness.  Musculoskeletal:        General: No edema. Normal range of motion.     Cervical back: Normal range of motion and neck supple.     Comments: Varicose veins in both lower extremities.  Scar over right knee from total knee replacement.  Scarring from partial thickness skin graft in the right lower leg. Right leg vein harvest incision healing well. Lymphadenopathy:    He has no cervical adenopathy.  Skin: Skin is warm and dry.  Psychiatric: He has a normal mood and affect. His behavior is normal.    Assessment/Plan:  This 64 year old  gentleman is status post coronary bypass graft surgery on 08/05/2019.  He has disrupted his sternal closure most likely due to his ongoing heavy smoking and COPD with chronic cough.  There is no sign of active infection and the incision is intact.  I think the best option is to perform sternal reconstruction to stabilize the sternum and prevent respiratory compromise and a delayed sternal wound infection.  I discussed the operative procedure with the patient including alternatives, benefits, and risk including but not limited to bleeding, blood transfusion, infection, recurrent sternal instability and need for other procedures.  He understands and agrees to proceed.     Alleen Borne, MD

## 2019-09-19 NOTE — Anesthesia Preprocedure Evaluation (Addendum)
Anesthesia Evaluation  Patient identified by MRN, date of birth, ID band Patient awake    Reviewed: Allergy & Precautions, NPO status , Patient's Chart, lab work & pertinent test results, reviewed documented beta blocker date and time   History of Anesthesia Complications Negative for: history of anesthetic complications  Airway Mallampati: I  TM Distance: >3 FB Neck ROM: Full    Dental  (+) Edentulous Upper, Edentulous Lower   Pulmonary COPD, Current SmokerPatient did not abstain from smoking.,  09/16/2019 SARS coronavirus NEG   breath sounds clear to auscultation       Cardiovascular hypertension, Pt. on medications and Pt. on home beta blockers (-) angina+ CAD and + CABG (08/05/2019)  + dysrhythmias Atrial Fibrillation  Rhythm:Regular Rate:Normal  08/03/2019 ECHO: is 35 to 40%. The left ventricle has moderately decreased function. The left ventricle  demonstrates global hypokinesis   Neuro/Psych Depression negative neurological ROS     GI/Hepatic negative GI ROS, Neg liver ROS,   Endo/Other  Hypothyroidism   Renal/GU negative Renal ROS     Musculoskeletal  (+) Arthritis , Osteoarthritis,    Abdominal   Peds  Hematology coumadin   Anesthesia Other Findings   Reproductive/Obstetrics                          Anesthesia Physical Anesthesia Plan  ASA: III  Anesthesia Plan: General   Post-op Pain Management:    Induction: Intravenous  PONV Risk Score and Plan: 1 and Ondansetron and Dexamethasone  Airway Management Planned: Oral ETT  Additional Equipment: None  Intra-op Plan:   Post-operative Plan: Extubation in OR  Informed Consent: I have reviewed the patients History and Physical, chart, labs and discussed the procedure including the risks, benefits and alternatives for the proposed anesthesia with the patient or authorized representative who has indicated his/her understanding and  acceptance.       Plan Discussed with: CRNA and Surgeon  Anesthesia Plan Comments:        Anesthesia Quick Evaluation

## 2019-09-20 ENCOUNTER — Encounter (HOSPITAL_COMMUNITY): Payer: Self-pay | Admitting: Surgery

## 2019-09-20 ENCOUNTER — Inpatient Hospital Stay (HOSPITAL_COMMUNITY): Payer: Medicare Other

## 2019-09-20 ENCOUNTER — Inpatient Hospital Stay (HOSPITAL_COMMUNITY)
Admission: RE | Admit: 2019-09-20 | Discharge: 2019-09-22 | DRG: 908 | Disposition: A | Discharge status: Home Health | Payer: Medicare Other | Attending: Surgery | Admitting: Surgery

## 2019-09-20 ENCOUNTER — Other Ambulatory Visit: Payer: Self-pay

## 2019-09-20 ENCOUNTER — Inpatient Hospital Stay (HOSPITAL_COMMUNITY): Payer: Medicare Other | Admitting: Vascular Surgery

## 2019-09-20 ENCOUNTER — Encounter (HOSPITAL_COMMUNITY)
Admission: RE | Disposition: A | Discharge status: Home Health | Payer: Self-pay | Source: Home / Self Care | Attending: Surgery

## 2019-09-20 ENCOUNTER — Inpatient Hospital Stay (HOSPITAL_COMMUNITY): Payer: Medicare Other | Admitting: Emergency Medicine

## 2019-09-20 DIAGNOSIS — J449 Chronic obstructive pulmonary disease, unspecified: Secondary | ICD-10-CM | POA: Diagnosis present

## 2019-09-20 DIAGNOSIS — Z20822 Contact with and (suspected) exposure to covid-19: Secondary | ICD-10-CM | POA: Diagnosis present

## 2019-09-20 DIAGNOSIS — T8130XA Disruption of wound, unspecified, initial encounter: Secondary | ICD-10-CM | POA: Diagnosis present

## 2019-09-20 DIAGNOSIS — I4891 Unspecified atrial fibrillation: Secondary | ICD-10-CM | POA: Diagnosis present

## 2019-09-20 DIAGNOSIS — Z9981 Dependence on supplemental oxygen: Secondary | ICD-10-CM | POA: Diagnosis not present

## 2019-09-20 DIAGNOSIS — D62 Acute posthemorrhagic anemia: Secondary | ICD-10-CM | POA: Diagnosis not present

## 2019-09-20 DIAGNOSIS — F17210 Nicotine dependence, cigarettes, uncomplicated: Secondary | ICD-10-CM | POA: Diagnosis present

## 2019-09-20 DIAGNOSIS — T8131XA Disruption of external operation (surgical) wound, not elsewhere classified, initial encounter: Principal | ICD-10-CM | POA: Diagnosis present

## 2019-09-20 DIAGNOSIS — E785 Hyperlipidemia, unspecified: Secondary | ICD-10-CM | POA: Diagnosis present

## 2019-09-20 DIAGNOSIS — I10 Essential (primary) hypertension: Secondary | ICD-10-CM | POA: Diagnosis present

## 2019-09-20 DIAGNOSIS — Z951 Presence of aortocoronary bypass graft: Secondary | ICD-10-CM

## 2019-09-20 DIAGNOSIS — R0789 Other chest pain: Secondary | ICD-10-CM | POA: Diagnosis present

## 2019-09-20 DIAGNOSIS — I9789 Other postprocedural complications and disorders of the circulatory system, not elsewhere classified: Secondary | ICD-10-CM

## 2019-09-20 DIAGNOSIS — T8132XA Disruption of internal operation (surgical) wound, not elsewhere classified, initial encounter: Secondary | ICD-10-CM | POA: Diagnosis present

## 2019-09-20 HISTORY — PX: STERNAL INCISION RECLOSURE: SHX2442

## 2019-09-20 HISTORY — PX: RIB PLATING: SHX5079

## 2019-09-20 LAB — PROTIME-INR
INR: 1.1 (ref 0.8–1.2)
Prothrombin Time: 13.9 seconds (ref 11.4–15.2)

## 2019-09-20 LAB — GLUCOSE, CAPILLARY
Glucose-Capillary: 128 mg/dL — ABNORMAL HIGH (ref 70–99)
Glucose-Capillary: 133 mg/dL — ABNORMAL HIGH (ref 70–99)
Glucose-Capillary: 142 mg/dL — ABNORMAL HIGH (ref 70–99)
Glucose-Capillary: 143 mg/dL — ABNORMAL HIGH (ref 70–99)

## 2019-09-20 SURGERY — REWIRING, STERNUM
Anesthesia: General

## 2019-09-20 MED ORDER — AMIODARONE HCL 200 MG PO TABS
200.0000 mg | ORAL_TABLET | Freq: Every day | ORAL | Status: DC
  Administered 2019-09-21 – 2019-09-22 (×2): 200 mg via ORAL
  Filled 2019-09-20 (×2): qty 1

## 2019-09-20 MED ORDER — NICOTINE 21 MG/24HR TD PT24
21.0000 mg | MEDICATED_PATCH | Freq: Every day | TRANSDERMAL | Status: DC
  Administered 2019-09-20 – 2019-09-22 (×3): 21 mg via TRANSDERMAL
  Filled 2019-09-20 (×3): qty 1

## 2019-09-20 MED ORDER — CHLORHEXIDINE GLUCONATE CLOTH 2 % EX PADS
6.0000 | MEDICATED_PAD | Freq: Every day | CUTANEOUS | Status: DC
  Administered 2019-09-21: 6 via TOPICAL

## 2019-09-20 MED ORDER — CHLORHEXIDINE GLUCONATE 0.12 % MT SOLN
15.0000 mL | Freq: Once | OROMUCOSAL | Status: AC
  Administered 2019-09-20: 15 mL via OROMUCOSAL
  Filled 2019-09-20: qty 15

## 2019-09-20 MED ORDER — ONDANSETRON HCL 4 MG/2ML IJ SOLN
INTRAMUSCULAR | Status: AC
  Filled 2019-09-20: qty 2

## 2019-09-20 MED ORDER — PHENYLEPHRINE 40 MCG/ML (10ML) SYRINGE FOR IV PUSH (FOR BLOOD PRESSURE SUPPORT)
PREFILLED_SYRINGE | INTRAVENOUS | Status: DC | PRN
  Administered 2019-09-20: 80 ug via INTRAVENOUS

## 2019-09-20 MED ORDER — 0.9 % SODIUM CHLORIDE (POUR BTL) OPTIME
TOPICAL | Status: DC | PRN
  Administered 2019-09-20: 2000 mL

## 2019-09-20 MED ORDER — ALBUTEROL SULFATE (2.5 MG/3ML) 0.083% IN NEBU: 2.5000 mg | INHALATION_SOLUTION | RESPIRATORY_TRACT | Status: DC | PRN

## 2019-09-20 MED ORDER — LUNG SURGERY BOOK
Freq: Once | Status: AC
  Filled 2019-09-20: qty 1

## 2019-09-20 MED ORDER — MIDAZOLAM HCL 5 MG/5ML IJ SOLN
INTRAMUSCULAR | Status: DC | PRN
  Administered 2019-09-20: 2 mg via INTRAVENOUS

## 2019-09-20 MED ORDER — MORPHINE SULFATE (PF) 4 MG/ML IV SOLN
4.0000 mg | INTRAVENOUS | Status: DC | PRN
  Administered 2019-09-21: 4 mg via INTRAVENOUS
  Filled 2019-09-20: qty 1

## 2019-09-20 MED ORDER — SUGAMMADEX SODIUM 200 MG/2ML IV SOLN
INTRAVENOUS | Status: DC | PRN
  Administered 2019-09-20: 200 mg via INTRAVENOUS

## 2019-09-20 MED ORDER — ASPIRIN EC 81 MG PO TBEC
81.0000 mg | DELAYED_RELEASE_TABLET | Freq: Every day | ORAL | Status: DC
  Administered 2019-09-21 – 2019-09-22 (×2): 81 mg via ORAL
  Filled 2019-09-20 (×2): qty 1

## 2019-09-20 MED ORDER — ROCURONIUM BROMIDE 10 MG/ML (PF) SYRINGE
PREFILLED_SYRINGE | INTRAVENOUS | Status: AC
  Filled 2019-09-20: qty 10

## 2019-09-20 MED ORDER — OXYCODONE HCL 5 MG PO TABS
5.0000 mg | ORAL_TABLET | ORAL | Status: DC | PRN
  Administered 2019-09-20: 10 mg via ORAL
  Administered 2019-09-20: 5 mg via ORAL
  Administered 2019-09-20 – 2019-09-22 (×8): 10 mg via ORAL
  Filled 2019-09-20 (×10): qty 2

## 2019-09-20 MED ORDER — METOPROLOL TARTRATE 12.5 MG HALF TABLET
12.5000 mg | ORAL_TABLET | Freq: Two times a day (BID) | ORAL | Status: DC
  Administered 2019-09-21 – 2019-09-22 (×3): 12.5 mg via ORAL
  Filled 2019-09-20 (×3): qty 1

## 2019-09-20 MED ORDER — CEFAZOLIN SODIUM-DEXTROSE 2-4 GM/100ML-% IV SOLN
2.0000 g | INTRAVENOUS | Status: AC
  Administered 2019-09-20: 2 g via INTRAVENOUS
  Filled 2019-09-20: qty 100

## 2019-09-20 MED ORDER — DEXAMETHASONE SODIUM PHOSPHATE 10 MG/ML IJ SOLN
INTRAMUSCULAR | Status: DC | PRN
  Administered 2019-09-20: 4 mg via INTRAVENOUS

## 2019-09-20 MED ORDER — PHENYLEPHRINE HCL-NACL 10-0.9 MG/250ML-% IV SOLN
INTRAVENOUS | Status: DC | PRN
  Administered 2019-09-20: 25 ug/min via INTRAVENOUS

## 2019-09-20 MED ORDER — LACTATED RINGERS IV SOLN: INTRAVENOUS | Status: DC

## 2019-09-20 MED ORDER — FENTANYL CITRATE (PF) 250 MCG/5ML IJ SOLN
INTRAMUSCULAR | Status: DC | PRN
  Administered 2019-09-20 (×2): 50 ug via INTRAVENOUS
  Administered 2019-09-20 (×3): 25 ug via INTRAVENOUS

## 2019-09-20 MED ORDER — CEFAZOLIN SODIUM-DEXTROSE 2-4 GM/100ML-% IV SOLN
2.0000 g | Freq: Three times a day (TID) | INTRAVENOUS | Status: AC
  Administered 2019-09-20 – 2019-09-21 (×2): 2 g via INTRAVENOUS
  Filled 2019-09-20 (×2): qty 100

## 2019-09-20 MED ORDER — PROPOFOL 10 MG/ML IV BOLUS
INTRAVENOUS | Status: DC | PRN
  Administered 2019-09-20 (×2): 20 mg via INTRAVENOUS
  Administered 2019-09-20: 30 mg via INTRAVENOUS
  Administered 2019-09-20: 100 mg via INTRAVENOUS
  Administered 2019-09-20: 30 mg via INTRAVENOUS

## 2019-09-20 MED ORDER — LIDOCAINE 2% (20 MG/ML) 5 ML SYRINGE
INTRAMUSCULAR | Status: AC
  Filled 2019-09-20: qty 5

## 2019-09-20 MED ORDER — ACETAMINOPHEN 160 MG/5ML PO SOLN: 1000.0000 mg | Freq: Four times a day (QID) | ORAL | Status: DC

## 2019-09-20 MED ORDER — ONDANSETRON HCL 4 MG/2ML IJ SOLN: 4.0000 mg | Freq: Four times a day (QID) | INTRAMUSCULAR | Status: DC | PRN

## 2019-09-20 MED ORDER — LEVOTHYROXINE SODIUM 100 MCG PO TABS
100.0000 ug | ORAL_TABLET | Freq: Every day | ORAL | Status: DC
  Administered 2019-09-21 – 2019-09-22 (×2): 100 ug via ORAL
  Filled 2019-09-20 (×2): qty 1

## 2019-09-20 MED ORDER — ACETAMINOPHEN 500 MG PO TABS
1000.0000 mg | ORAL_TABLET | Freq: Once | ORAL | Status: AC
  Administered 2019-09-20: 1000 mg via ORAL
  Filled 2019-09-20: qty 2

## 2019-09-20 MED ORDER — FENTANYL CITRATE (PF) 250 MCG/5ML IJ SOLN
INTRAMUSCULAR | Status: AC
  Filled 2019-09-20: qty 5

## 2019-09-20 MED ORDER — INSULIN ASPART 100 UNIT/ML ~~LOC~~ SOLN
0.0000 [IU] | SUBCUTANEOUS | Status: DC
  Administered 2019-09-20 – 2019-09-21 (×5): 2 [IU] via SUBCUTANEOUS

## 2019-09-20 MED ORDER — SODIUM CHLORIDE 0.9 % IV SOLN: INTRAVENOUS | Status: DC | PRN

## 2019-09-20 MED ORDER — BISACODYL 5 MG PO TBEC
10.0000 mg | DELAYED_RELEASE_TABLET | Freq: Every day | ORAL | Status: DC
  Administered 2019-09-20 – 2019-09-22 (×3): 10 mg via ORAL
  Filled 2019-09-20 (×3): qty 2

## 2019-09-20 MED ORDER — ENOXAPARIN SODIUM 40 MG/0.4ML ~~LOC~~ SOLN
40.0000 mg | Freq: Every day | SUBCUTANEOUS | Status: DC
  Administered 2019-09-21 – 2019-09-22 (×2): 40 mg via SUBCUTANEOUS
  Filled 2019-09-20 (×2): qty 0.4

## 2019-09-20 MED ORDER — ATORVASTATIN CALCIUM 80 MG PO TABS
80.0000 mg | ORAL_TABLET | Freq: Every day | ORAL | Status: DC
  Administered 2019-09-20 – 2019-09-21 (×2): 80 mg via ORAL
  Filled 2019-09-20 (×2): qty 1

## 2019-09-20 MED ORDER — ACETAMINOPHEN 500 MG PO TABS
1000.0000 mg | ORAL_TABLET | Freq: Four times a day (QID) | ORAL | Status: DC
  Administered 2019-09-20 – 2019-09-22 (×5): 1000 mg via ORAL
  Filled 2019-09-20 (×6): qty 2

## 2019-09-20 MED ORDER — ROCURONIUM BROMIDE 10 MG/ML (PF) SYRINGE
PREFILLED_SYRINGE | INTRAVENOUS | Status: DC | PRN
  Administered 2019-09-20: 20 mg via INTRAVENOUS
  Administered 2019-09-20: 60 mg via INTRAVENOUS
  Administered 2019-09-20: 20 mg via INTRAVENOUS

## 2019-09-20 MED ORDER — METOPROLOL TARTRATE 12.5 MG HALF TABLET
12.5000 mg | ORAL_TABLET | Freq: Once | ORAL | Status: AC
  Administered 2019-09-20: 12.5 mg via ORAL

## 2019-09-20 MED ORDER — PROMETHAZINE HCL 25 MG/ML IJ SOLN: 6.2500 mg | INTRAMUSCULAR | Status: DC | PRN

## 2019-09-20 MED ORDER — MIDAZOLAM HCL 2 MG/2ML IJ SOLN: 0.5000 mg | Freq: Once | INTRAMUSCULAR | Status: DC | PRN

## 2019-09-20 MED ORDER — OXYCODONE HCL 5 MG PO TABS
ORAL_TABLET | ORAL | Status: AC
  Filled 2019-09-20: qty 1

## 2019-09-20 MED ORDER — SENNOSIDES-DOCUSATE SODIUM 8.6-50 MG PO TABS
1.0000 | ORAL_TABLET | Freq: Every day | ORAL | Status: DC
  Administered 2019-09-20 – 2019-09-21 (×2): 1 via ORAL
  Filled 2019-09-20 (×2): qty 1

## 2019-09-20 MED ORDER — PROPOFOL 10 MG/ML IV BOLUS
INTRAVENOUS | Status: AC
  Filled 2019-09-20: qty 20

## 2019-09-20 MED ORDER — MIDAZOLAM HCL 2 MG/2ML IJ SOLN
INTRAMUSCULAR | Status: AC
  Filled 2019-09-20: qty 2

## 2019-09-20 MED ORDER — DEXAMETHASONE SODIUM PHOSPHATE 10 MG/ML IJ SOLN
INTRAMUSCULAR | Status: AC
  Filled 2019-09-20: qty 1

## 2019-09-20 MED ORDER — DEXTROSE-NACL 5-0.45 % IV SOLN: INTRAVENOUS | Status: DC

## 2019-09-20 MED ORDER — HYDROMORPHONE HCL 1 MG/ML IJ SOLN
0.2500 mg | INTRAMUSCULAR | Status: DC | PRN
  Administered 2019-09-20 (×2): 0.5 mg via INTRAVENOUS

## 2019-09-20 MED ORDER — MEPERIDINE HCL 25 MG/ML IJ SOLN: 6.2500 mg | INTRAMUSCULAR | Status: DC | PRN

## 2019-09-20 MED ORDER — LIDOCAINE 2% (20 MG/ML) 5 ML SYRINGE
INTRAMUSCULAR | Status: DC | PRN
  Administered 2019-09-20: 70 mg via INTRAVENOUS
  Administered 2019-09-20: 30 mg via INTRAVENOUS

## 2019-09-20 MED ORDER — ONDANSETRON HCL 4 MG/2ML IJ SOLN
INTRAMUSCULAR | Status: DC | PRN
  Administered 2019-09-20: 4 mg via INTRAVENOUS

## 2019-09-20 MED ORDER — HYDROMORPHONE HCL 1 MG/ML IJ SOLN
INTRAMUSCULAR | Status: AC
  Filled 2019-09-20: qty 1

## 2019-09-20 MED ORDER — METOPROLOL TARTRATE 12.5 MG HALF TABLET
ORAL_TABLET | ORAL | Status: AC
  Filled 2019-09-20: qty 1

## 2019-09-20 MED ORDER — ALBUTEROL SULFATE (2.5 MG/3ML) 0.083% IN NEBU
2.5000 mg | INHALATION_SOLUTION | RESPIRATORY_TRACT | Status: DC
  Filled 2019-09-20: qty 3

## 2019-09-20 MED ORDER — ORAL CARE MOUTH RINSE: 15.0000 mL | Freq: Once | OROMUCOSAL | Status: AC

## 2019-09-20 SURGICAL SUPPLY — 76 items
ATTRACTOMAT 16X20 MAGNETIC DRP (DRAPES) ×2 IMPLANT
BAG DECANTER FOR FLEXI CONT (MISCELLANEOUS) IMPLANT
BAND RUBBER #18 3X1/16 STRL (MISCELLANEOUS) IMPLANT
BATTERY MAXDRIVER (MISCELLANEOUS) ×2 IMPLANT
BLADE CLIPPER SURG (BLADE) ×2 IMPLANT
BLADE SURG 10 STRL SS (BLADE) IMPLANT
BNDG GAUZE ELAST 4 BULKY (GAUZE/BANDAGES/DRESSINGS) ×2 IMPLANT
CANISTER SUCT 3000ML PPV (MISCELLANEOUS) ×2 IMPLANT
CATH THORACIC 28FR RT ANG (CATHETERS) IMPLANT
CATH THORACIC 36FR (CATHETERS) IMPLANT
CATH THORACIC 36FR RT ANG (CATHETERS) IMPLANT
CLIP VESOCCLUDE MED 6/CT (CLIP) ×2 IMPLANT
CLIP VESOCCLUDE SM WIDE 24/CT (CLIP) IMPLANT
CNTNR URN SCR LID CUP LEK RST (MISCELLANEOUS) IMPLANT
CONN ST 1/4X3/8  BEN (MISCELLANEOUS) ×1
CONN ST 1/4X3/8 BEN (MISCELLANEOUS) ×1 IMPLANT
CONT SPEC 4OZ STRL OR WHT (MISCELLANEOUS)
COVER SURGICAL LIGHT HANDLE (MISCELLANEOUS) IMPLANT
DERMABOND ADVANCED (GAUZE/BANDAGES/DRESSINGS) ×1
DERMABOND ADVANCED .7 DNX12 (GAUZE/BANDAGES/DRESSINGS) ×1 IMPLANT
DRAIN CHANNEL 28F RND 3/8 FF (WOUND CARE) ×2 IMPLANT
DRAPE CHEST BREAST 15X10 FENES (DRAPES) ×2 IMPLANT
DRAPE LAPAROSCOPIC ABDOMINAL (DRAPES) IMPLANT
DRSG COVADERM 4X10 (GAUZE/BANDAGES/DRESSINGS) IMPLANT
DRSG COVADERM 4X14 (GAUZE/BANDAGES/DRESSINGS) ×2 IMPLANT
DRSG MEPILEX BORDER 4X8 (GAUZE/BANDAGES/DRESSINGS) ×2 IMPLANT
ELECT REM PT RETURN 9FT ADLT (ELECTROSURGICAL) ×2
ELECTRODE REM PT RTRN 9FT ADLT (ELECTROSURGICAL) ×1 IMPLANT
GAUZE SPONGE 4X4 12PLY STRL (GAUZE/BANDAGES/DRESSINGS) ×4 IMPLANT
GAUZE XEROFORM 5X9 LF (GAUZE/BANDAGES/DRESSINGS) IMPLANT
GLOVE BIOGEL PI IND STRL 6.5 (GLOVE) ×2 IMPLANT
GLOVE BIOGEL PI IND STRL 8.5 (GLOVE) ×1 IMPLANT
GLOVE BIOGEL PI INDICATOR 6.5 (GLOVE) ×2
GLOVE BIOGEL PI INDICATOR 8.5 (GLOVE) ×1
GLOVE SS BIOGEL STRL SZ 7 (GLOVE) IMPLANT
GLOVE SUPERSENSE BIOGEL SZ 7 (GLOVE)
GLOVE TRIUMPH SURG SIZE 7.0 (KITS) ×2 IMPLANT
GOWN STRL REUS W/ TWL LRG LVL3 (GOWN DISPOSABLE) IMPLANT
GOWN STRL REUS W/ TWL XL LVL3 (GOWN DISPOSABLE) ×2 IMPLANT
GOWN STRL REUS W/TWL LRG LVL3 (GOWN DISPOSABLE)
GOWN STRL REUS W/TWL XL LVL3 (GOWN DISPOSABLE) ×2
HANDPIECE INTERPULSE COAX TIP (DISPOSABLE)
HEMOSTAT POWDER SURGIFOAM 1G (HEMOSTASIS) IMPLANT
HEMOSTAT SURGICEL 2X14 (HEMOSTASIS) IMPLANT
KIT BASIN OR (CUSTOM PROCEDURE TRAY) ×2 IMPLANT
KIT SUCTION CATH 14FR (SUCTIONS) ×2 IMPLANT
KIT TURNOVER KIT B (KITS) ×2 IMPLANT
NS IRRIG 1000ML POUR BTL (IV SOLUTION) ×2 IMPLANT
PACK CHEST (CUSTOM PROCEDURE TRAY) ×2 IMPLANT
PAD ABD 8X10 STRL (GAUZE/BANDAGES/DRESSINGS) ×2 IMPLANT
PAD ARMBOARD 7.5X6 YLW CONV (MISCELLANEOUS) ×4 IMPLANT
PIN SAFETY STERILE (MISCELLANEOUS) IMPLANT
PLATE STERNAL 2.3X208 14H 2-PK (Plate) ×2 IMPLANT
SCREW STERNAL 2.3X17MM (Screw) ×40 IMPLANT
SET HNDPC FAN SPRY TIP SCT (DISPOSABLE) IMPLANT
SOL PREP POV-IOD 4OZ 10% (MISCELLANEOUS) IMPLANT
SPONGE LAP 18X18 RF (DISPOSABLE) ×2 IMPLANT
SUT SILK  1 MH (SUTURE) ×1
SUT SILK 1 MH (SUTURE) ×1 IMPLANT
SUT SILK 3 0 SH CR/8 (SUTURE) ×2 IMPLANT
SUT STEEL 6MS V (SUTURE) IMPLANT
SUT STEEL STERNAL CCS#1 18IN (SUTURE) IMPLANT
SUT STEEL SZ 6 DBL 3X14 BALL (SUTURE) ×6 IMPLANT
SUT VIC AB 1 CTX 36 (SUTURE) ×3
SUT VIC AB 1 CTX36XBRD ANBCTR (SUTURE) ×3 IMPLANT
SUT VIC AB 2-0 CTX 27 (SUTURE) ×4 IMPLANT
SUT VIC AB 2-0 CTX 36 (SUTURE) IMPLANT
SUT VIC AB 3-0 X1 27 (SUTURE) ×4 IMPLANT
SWAB COLLECTION DEVICE MRSA (MISCELLANEOUS) IMPLANT
SWAB CULTURE ESWAB REG 1ML (MISCELLANEOUS) IMPLANT
SYR 5ML LL (SYRINGE) IMPLANT
SYSTEM SAHARA CHEST DRAIN ATS (WOUND CARE) ×2 IMPLANT
TOWEL GREEN STERILE (TOWEL DISPOSABLE) ×2 IMPLANT
TOWEL GREEN STERILE FF (TOWEL DISPOSABLE) ×2 IMPLANT
TRAY FOLEY SLVR 14FR TEMP STAT (SET/KITS/TRAYS/PACK) ×2 IMPLANT
WATER STERILE IRR 1000ML POUR (IV SOLUTION) ×2 IMPLANT

## 2019-09-20 NOTE — Transfer of Care (Signed)
Immediate Anesthesia Transfer of Care Note  Patient: Matthew [name]  Procedure(s) Performed: STERNAL RECONSTRUCTION AND STERNAL REWIRING (N/A ) RIB PLATING WITH KLS MARTIN STERNAL PLATE (N/A )  Patient Location: PACU  Anesthesia Type:General  Level of Consciousness: awake  Airway & Oxygen Therapy: Patient Spontanous Breathing  Post-op Assessment: Report given to RN and Post -op Vital signs reviewed and stable  Post vital signs: Reviewed and stable  Last Vitals:  Vitals Value Taken Time  BP 112/74 09/20/19 1103  Temp    Pulse 60 09/20/19 1107  Resp 23 09/20/19 1107  SpO2 93 % 09/20/19 1107  Vitals shown include unvalidated device data.  Last Pain:  Vitals:   09/20/19 0653  TempSrc: Oral  PainSc:          Complications: No complications documented.

## 2019-09-20 NOTE — Interval H&P Note (Signed)
History and Physical Interval Note:  09/20/2019 7:16 AM  Matthew [name]  has presented today for surgery, with the diagnosis of STERNAL PAIN.  The various methods of treatment have been discussed with the patient and family. After consideration of risks, benefits and other options for treatment, the patient has consented to  Procedure(s): STERNAL RECONSTRUCTION AND STERNAL REWIRING (N/A) as a surgical intervention.  The patient's history has been reviewed, patient examined, no change in status, stable for surgery.  I have reviewed the patient's chart and labs.  Questions were answered to the patient's satisfaction.     Alleen Borne

## 2019-09-20 NOTE — Progress Notes (Signed)
CT surgery p.m. Rounds  Patient breathing comfortably after sternal rewiring and plating Minimal chest tube output Sinus rhythm  Blood pressure 130/77, pulse 64, temperature 98 F (36.7 C), temperature source Oral, resp. rate 20, height 5' 5.5" (1.664 m), weight 82.6 kg, SpO2 97 %.

## 2019-09-20 NOTE — Brief Op Note (Signed)
09/20/2019  11:00 AM  PATIENT:  Matthew [name]  64 y.o. male  PRE-OPERATIVE DIAGNOSIS:  STERNAL Dehiscence  POST-OPERATIVE DIAGNOSIS:  STERNAL Dehiscence  PROCEDURE:  Procedure(s): STERNAL RECONSTRUCTION AND STERNAL REWIRING (N/A) RIB PLATING WITH KLS MARTIN STERNAL PLATE (N/A)  SURGEON:  Surgeon(s) and Role:    * Jamel Holzmann, Payton Doughty, MD - Primary  PHYSICIAN ASSISTANT: none  ASSISTANTS: none  ANESTHESIA:   general  EBL:  100 mL   BLOOD ADMINISTERED:none  DRAINS: (28) Blake drain(s) in the anterior mediastinum   LOCAL MEDICATIONS USED:  NONE  SPECIMEN:  No Specimen  DISPOSITION OF SPECIMEN:  N/A  COUNTS:  YES  TOURNIQUET:  * No tourniquets in log *  DICTATION: .Note written in EPIC  PLAN OF CARE: Admit to inpatient   PATIENT DISPOSITION:  PACU - hemodynamically stable.   Delay start of Pharmacological VTE agent (>24hrs) due to surgical blood loss or risk of bleeding: yes

## 2019-09-20 NOTE — Progress Notes (Signed)
Pt c/o "my arms feel numb" upon adm.to PACU. Bilateral HG equal and strong and he is able to hold arms up. Within 10 min. of adm. He says LUE "feels much better, still numbnes on RUE". Dr Laneta Simmers here & aware,.  Will continue to monitor.

## 2019-09-20 NOTE — Anesthesia Procedure Notes (Signed)
Procedure Name: Intubation °Performed by: Betsaida [place] H, CRNA °Pre-anesthesia Checklist: Patient identified, Emergency Drugs available, Suction available and Patient being monitored °Patient Re-evaluated:Patient Re-evaluated prior to induction °Oxygen Delivery Method: Circle System Utilized °Preoxygenation: Pre-oxygenation with 100% oxygen °Induction Type: IV induction °Ventilation: Mask ventilation without difficulty and Oral airway inserted - appropriate to patient size °Laryngoscope Size: Mac and 3 °Grade View: Grade I °Tube type: Oral °Tube size: 7.5 mm °Number of attempts: 1 °Airway Equipment and Method: Stylet and Oral airway °Placement Confirmation: ETT inserted through vocal cords under direct vision,  positive ETCO2 and breath sounds checked- equal and bilateral °Secured at: 23 cm °Tube secured with: Tape °Dental Injury: Teeth and Oropharynx as per pre-operative assessment  ° ° ° ° ° ° °

## 2019-09-20 NOTE — Progress Notes (Signed)
Dr Sandford Craze fully updated, aware of pt's c/o RUE numbness, which he states" is getting better". OK to tx to 2H.

## 2019-09-20 NOTE — Op Note (Signed)
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   Anesthesia:  General Endotracheal   Clinical History/Surgical Indication:  This 64 year old gentleman is status post coronary bypass graft surgery on 08/05/2019.  He has disrupted his sternal closure most likely due to his ongoing heavy smoking and COPD with chronic cough.  There is no sign of active infection and the incision is intact.  I think the best option is to perform sternal reconstruction to stabilize the sternum and prevent respiratory compromise and a delayed sternal wound infection.  I discussed the operative procedure with the patient including alternatives, benefits, and risk including but not limited to bleeding, blood transfusion, infection, recurrent sternal instability and need for other procedures.  He understands and agrees to proceed.  Preparation:  The patient was seen in the preoperative holding area and the correct patient, correct operation were confirmed with the patient after reviewing the medical record. The consent was signed by me. Preoperative antibiotics were given. A radial arterial line were placed by the anesthesia team. The patient was taken back to the operating room and positioned supine on the operating room table. After being placed under general endotracheal anesthesia by the anesthesia team a foley catheter was placed. The neck, chest, and abdomen were prepped with betadine soap and solution and draped in the usual sterile manner. A surgical time-out was taken and the correct patient and operative procedure were confirmed with the nursing and anesthesia  staff.   Sternal reconstruction  A median sternotomy incision was performed through the previous scar.  Dissection was continued down through the subcutaneous tissue using electrocautery.  After the subcutaneous tissue was opened it was immediately evident that the sternum was completely separated with all of the wires pulling through the left side of the sternum.  The manubrium was slightly separated but the wires were intact and had not pulled through.  All the sternal wires were removed.  Bone hooks were used to retract the sternal edges and a plane was developed between the backside of the sternum and the mediastinal structures.  This was performed without incident.  Then the subcutaneous tissue was separated from the anterior table of the sternum using electrocautery for about 1 cm on both sides to allow sternal plating.  The left side of the sternum had broken into 3 separate pieces when the wires pulled through.  The granulation tissue on the sternum was gently removed with a curette.  The bone appeared viable with bleeding.  Then 2 KLS Martin sternal plates (2.3 x 326)( model [id number])( LOG [id number]) were slightly shortened to the appropriate length.  These were screwed to the anterior sternum using ten 2.3 x 17 mm screws on each side.  This appeared to give good rigid fixation to both sides.  Then double #6 stainless steel wires were passed on the outside of the sternal plates and through the sternum on both sides.  A 28 [place] Blake drain was brought through separate stab incision and positioned in the substernal space.  The sternum was reapproximated without difficulty and the sternal wires tightened.  The sternum appeared stable.  The elevated portion of pectoralis muscle and fascia was approximated using continuous #1 Vicryl suture.  The subcutaneous tissue was reapproximated with continuous 2-0 Vicryl suture.  The  skin was closed with continuous 3-0 Vicryl subcuticular closure.  The sponge needle  instrument counts were correct according scrub nurse.  A dry sterile dressing was applied over the incision and around the chest tube which was hooked to Pleur-evac suction.   The patient was then awakened and transported to the post anesthesia care unit in stable condition.

## 2019-09-20 NOTE — Anesthesia Postprocedure Evaluation (Signed)
Anesthesia Post Note  Patient: Matthew [name]  Procedure(s) Performed: STERNAL RECONSTRUCTION AND STERNAL REWIRING (N/A ) RIB PLATING WITH KLS MARTIN STERNAL PLATE (N/A )     Patient location during evaluation: SICU Anesthesia Type: General Level of consciousness: awake and alert, oriented and patient cooperative Pain management: pain level controlled Vital Signs Assessment: post-procedure vital signs reviewed and stable Respiratory status: spontaneous breathing, nonlabored ventilation and respiratory function stable Cardiovascular status: blood pressure returned to baseline and stable Postop Assessment: no apparent nausea or vomiting and adequate PO intake Anesthetic complications: no Comments: Pt states his Bilat arm weakness and soreness, which was noted in PACU, has completely resolved.    No complications documented.  Last Vitals:  Vitals:   09/20/19 1600 09/20/19 1700  BP: 122/77 130/77  Pulse: 66 64  Resp: (!) 23 20  Temp:    SpO2: 99% 97%    Last Pain:  Vitals:   09/20/19 1648  TempSrc:   PainSc: 7                  Samael Blades,E. Xaden Kaufman

## 2019-09-20 NOTE — Significant Event (Signed)
Patient's personal belongings are locked up in security (including a wallet and its contents inside, razors, a pack of cigarettes, lighter, pocket knife) and home medications are locked up with pharmacy at the requests of patient. These items are witnessed and counted with another RN Aliene Altes and is counted and sealed in front of patient.   Copies of the inventory records are placed in patient's shadow chart. Per patient, he is keeping at bedside his clothes, cell-phone, phone charger, pair of shoes, and a folder with discharge papers from prior admission.   Matthew [name]

## 2019-09-21 ENCOUNTER — Inpatient Hospital Stay (HOSPITAL_COMMUNITY): Payer: Medicare Other

## 2019-09-21 ENCOUNTER — Encounter (HOSPITAL_COMMUNITY): Payer: Self-pay | Admitting: Surgery

## 2019-09-21 LAB — CBC
HCT: 34 % — ABNORMAL LOW (ref 39.0–52.0)
Hemoglobin: 10.7 g/dL — ABNORMAL LOW (ref 13.0–17.0)
MCH: 29.1 pg (ref 26.0–34.0)
MCHC: 31.5 g/dL (ref 30.0–36.0)
MCV: 92.4 fL (ref 80.0–100.0)
Platelets: 350 10*3/uL (ref 150–400)
RBC: 3.68 MIL/uL — ABNORMAL LOW (ref 4.22–5.81)
RDW: 13.9 % (ref 11.5–15.5)
WBC: 13.7 10*3/uL — ABNORMAL HIGH (ref 4.0–10.5)
nRBC: 0 % (ref 0.0–0.2)

## 2019-09-21 LAB — BASIC METABOLIC PANEL
Anion gap: 10 (ref 5–15)
BUN: 16 mg/dL (ref 8–23)
CO2: 23 mmol/L (ref 22–32)
Calcium: 9.1 mg/dL (ref 8.9–10.3)
Chloride: 101 mmol/L (ref 98–111)
Creatinine, Ser: 1 mg/dL (ref 0.61–1.24)
GFR calc Af Amer: >60 mL/min (ref >60)
GFR calc non Af Amer: >60 mL/min (ref >60)
Glucose, Bld: 143 mg/dL — ABNORMAL HIGH (ref 70–99)
Potassium: 4.7 mmol/L (ref 3.5–5.1)
Sodium: 134 mmol/L — ABNORMAL LOW (ref 135–145)

## 2019-09-21 LAB — GLUCOSE, CAPILLARY
Glucose-Capillary: 153 mg/dL — ABNORMAL HIGH (ref 70–99)
Glucose-Capillary: 99 mg/dL (ref 70–99)
Glucose-Capillary: 108 mg/dL — ABNORMAL HIGH (ref 70–99)

## 2019-09-21 NOTE — Progress Notes (Signed)
Pt arrived to 4e from 2h. Pt oriented to room and staff. Vitals obtained. CHG bath completed. Telemetry box applied and CCMD notified x2 verifiers. PRN pain medication given, per pt request.

## 2019-09-21 NOTE — Discharge Summary (Addendum)
[address]       [place] [address]             [phone]      Physician Discharge Summary  Patient ID: Matthew [name] MRN: [id number] DOB/AGE: Jul 13, 1955 64 y.o.  Admit date: 09/20/2019 Discharge date: 09/22/2019  Admission Diagnoses:  Patient Active Problem List   Diagnosis Date Noted  . Sternal pain 09/20/2019  . 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 Long term (current) use of anticoagulants 08/19/2019  . Paroxysmal atrial fibrillation (HCC)   . 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 Coronary artery disease 08/05/2019  . CAD S/P percutaneous coronary angioplasty of 100% OM1 08/04/2019  . Ischemic cardiomyopathy 08/04/2019  . Essential hypertension 08/04/2019  . History of stroke 08/04/2019  . Heavy smoker 1 PPD since age 43 08/04/2019  . Hyperlipidemia with target LDL less than 70 08/04/2019  . NSTEMI (non-ST elevated myocardial infarction) (HCC) 08/03/2019  . 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 His troponin was elevated at 5.98. He had continued chest pain and was transferred to [facility] for cardiac catheterization which showed severe three-vessel coronary disease. The culprit lesion was an occluded large first marginal branch which was successfully opened with balloon angioplasty. No stent was placed since the patient had severe three-vessel coronary disease and surgery was felt to be the best treatment. There is also a 90% ostial to proximal LAD stenosis. There is a large first diagonal and 80%  stenosis. The right coronary artery was diffusely diseased with segmental 60% mid to distal vessel stenosis. Left ventricular ejection fraction of 35 to 45% by visual estimate with a moderately elevated LVEDP of 21. A 2D echocardiogram today showed an ejection fraction of 35 to 40% with global hypokinesis. The left ventricle is mildly dilated. There is grade 1 diastolic dysfunction. RV function is normal. There was mild mitral regurgitation and mild aortic insufficiency. He underwent coronary bypass graft surgery x3 on 08/05/2019. He had a left internal mammary graft to the LAD and saphenous vein graft to the diagonal branch and obtuse marginal branch. His postoperative course was complicated by atrial fibrillation with rapid ventricular response treated with amiodarone. His rate was not well controlled he was started on digoxin. His atrial fibrillation persisted and he was started on anticoagulation with Coumadin. He also required BiPAP initially for respiratory difficulty due to prolonged active tobacco abuse and COPD. Home oxygen was arranged. Since going home he was treated for pneumonia with Augmentin. He returned to smoking after discharge and when I saw him back in the office on 09/07/2019 he reported that he was still smoking 1 pack of cigarettes per day. Over the week prior he developed some right-sided chest pain. On exam he was noted to have instability of the lower half of the sternum. The incision was healing well with no signs of infection although there was minimal pinkish discoloration at the lower third of the incision.  The patient is on disability due to multiple orthopedic surgeries in the past. 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   Hospital Course:   On 6/29 [name] underwent a sternal reconstruction KLS Martin sternal plates and wires with Dr. Laneta Simmers.  He tolerated the procedure well was transferred to the  surgical ICU.  He was extubated later that day.  Postop day 1 he remained hemodynamically stable and in NSR. He was started on Amio 200mg  daily for postoperative recurrent atrial fibrillation at discharge his coumadin will be restarted. He does have an open leg wound form his saphenous vein harvest which was cleaned a dressed in the OR. This will require daily wet-to-dry dressing changes at home. Today, he is ambulating with limited assistance, tolerating room air, his incisions are healing well, and he is ready for discharge home with home health to assist.   Consults: None  Significant Diagnostic Studies:   CLINICAL DATA:  Chest tube  EXAM: PORTABLE CHEST 1 VIEW  COMPARISON:  09/20/2019.  FINDINGS: Status post median sternotomy and CABG procedure. Interval removal of the right chest tube. No pleural effusion identified. Improvement in previous pulmonary vascular congestion. No airspace consolidation.  IMPRESSION: 1. Interval removal of right chest tube without evidence for pneumothorax.   Electronically Signed   By: 09/22/2019 M.D.   On: 09/21/2019 08:51  Treatments:  CARDIOVASCULAR SURGERY OPERATIVE NOTE  09/20/2019  Surgeon:  09/22/2019, MD  First Assistant: none   Preoperative Diagnosis: Sternal dehiscence status post coronary bypass graft surgery   Postoperative Diagnosis:  Same   Procedure: Sternal reconstruction using KLS Martin sternal plates and wires.   Anesthesia:  General Endotracheal   Clinical History/Surgical Indication:  This 64 year old gentlemanis status post coronary bypass graft surgery on 08/05/2019. He has disrupted his sternal closure most likely due to his ongoing heavy smoking and COPD with chronic cough. There is no sign of active infection and the incision is intact. I think the best option is to perform sternal reconstruction to stabilize the sternum and prevent respiratory compromise and a delayed  sternal wound infection. I discussed the operative procedure with the patient including alternatives, benefits, and risk including but not limited to bleeding, blood transfusion, infection, recurrent sternal instability and need for other procedures. He understands and agrees to proceed.  Preparation:  The patient was seen in the preoperative holding area and the correct patient, correct operation were confirmed with the patient after reviewing the medical record. The consent was signed by me. Preoperative antibiotics were given. A radial arterial line were placed by the anesthesia team. The patient was taken back to the operating room and positioned supine on the operating room table. After being placed under general endotracheal anesthesia by the anesthesia team a foley catheter was placed. The neck, chest, and abdomen were prepped with betadine soap and solution and draped in the usual sterile manner. A surgical time-out was taken and the correct patient and operative procedure were confirmed with the nursing and anesthesia staff.   Sternal reconstruction  A median sternotomy incision was performed through the previous scar.  Dissection was continued down through the subcutaneous tissue using electrocautery.  After the subcutaneous tissue was opened it was immediately evident that the sternum was completely separated with all of the wires pulling through the left side of the sternum.  The manubrium was slightly separated but the wires were intact and had not pulled through.  All the sternal wires were removed.  Bone hooks were used to retract the sternal edges and  a plane was developed between the backside of the sternum and the mediastinal structures.  This was performed without incident.  Then the subcutaneous tissue was separated from the anterior table of the sternum using electrocautery for about 1 cm on both sides to allow sternal plating.  The left side of the sternum had broken into 3  separate pieces when the wires pulled through.  The granulation tissue on the sternum was gently removed with a curette.  The bone appeared viable with bleeding.  Then 2 KLS Martin sternal plates (2.3 x 883)( model [id number])( LOG [id number]) were slightly shortened to the appropriate length.  These were screwed to the anterior sternum using ten 2.3 x 17 mm screws on each side.  This appeared to give good rigid fixation to both sides.  Then double #6 stainless steel wires were passed on the outside of the sternal plates and through the sternum on both sides.  A 28 [place] Blake drain was brought through separate stab incision and positioned in the substernal space.  The sternum was reapproximated without difficulty and the sternal wires tightened.  The sternum appeared stable.  The elevated portion of pectoralis muscle and fascia was approximated using continuous #1 Vicryl suture.  The subcutaneous tissue was reapproximated with continuous 2-0 Vicryl suture.  The skin was closed with continuous 3-0 Vicryl subcuticular closure.  The sponge needle instrument counts were correct according scrub nurse.  A dry sterile dressing was applied over the incision and around the chest tube which was hooked to Pleur-evac suction.   The patient was then awakened and transported to the post anesthesia care unit in stable condition.  Discharge Exam: Blood pressure (!) 115/59, pulse 62, temperature 98.4 F (36.9 C), temperature source Oral, resp. rate 10, height 5' 5.5" (1.664 m), weight 84.9 kg, SpO2 93 %.   General appearance: alert, cooperative and no distress Heart: regular rate and rhythm, S1, S2 normal, no murmur, click, rub or gallop Lungs: clear to auscultation bilaterally Abdomen: soft, non-tender; bowel sounds normal; no masses,  no organomegaly Extremities: extremities normal, atraumatic, no cyanosis or edema Wound: clean and dry   Disposition: Discharge disposition: 01-Home or Self  Care        Allergies as of 09/22/2019   No Known Allergies     Medication List    STOP taking these medications   amoxicillin-clavulanate 500-125 MG tablet Commonly known as: AUGMENTIN   furosemide 40 MG tablet Commonly known as: LASIX   potassium chloride SA 20 MEQ tablet Commonly known as: KLOR-CON     TAKE these medications   acetaminophen 325 MG tablet Commonly known as: TYLENOL Take 2 tablets (650 mg total) by mouth every 6 (six) hours as needed for mild pain.   amiodarone 200 MG tablet Commonly known as: PACERONE Take 1 tablet (200 mg total) by mouth daily.   aspirin 81 MG EC tablet Take 1 tablet (81 mg total) by mouth daily.   atorvastatin 80 MG tablet Commonly known as: LIPITOR Take 1 tablet (80 mg total) by mouth daily at 6 PM.   cephALEXin 500 MG capsule Commonly known as: Keflex Take 1 capsule (500 mg total) by mouth 2 (two) times daily.   levothyroxine 100 MCG tablet Commonly known as: SYNTHROID Take 1 tablet (100 mcg total) by mouth daily at 6 (six) AM.   metoprolol tartrate 25 MG tablet Commonly known as: LOPRESSOR Take 0.5 tablets (12.5 mg total) by mouth 2 (two) times daily.   nicotine 21 mg/24hr patch  Commonly known as: NICODERM CQ - dosed in mg/24 hours Place 1 patch (21 mg total) onto the skin daily.   oxycodone 5 MG capsule Commonly known as: OXY-IR Take 1 capsule (5 mg total) by mouth every 6 (six) hours as needed. What changed:   reasons to take this  Another medication with the same name was removed. Continue taking this medication, and follow the directions you see here.   warfarin 5 MG tablet Commonly known as: COUMADIN Take as directed. If you are unsure how to take this medication, talk to your nurse or doctor. Original instructions: Take 1 tablet (5 mg total) by mouth daily at 4 PM. Or as directed       Follow-up Information    Juel Burrow, NP. Call in 1 day(s).   Contact information: [address] [place]  [place] [address] [phone]        Revankar, Aundra Dubin, MD Follow up.   Specialty: Cardiology Why: Your follow-up appointment is on 09/29/2019 at 3:20pm. Please bring your hospital paperwork.  Contact information: [address] [place]  [place] [address] [phone]        Alleen Borne, MD Follow up.   Specialty: Cardiothoracic Surgery Why: Your appointment is on 09/27/2019 at 3:30pm. Please arrive at 3:00pm for a chest xray located at [facility] which is on the first floor of our building.  Contact information: [address] [employer] [address] [place] [address] [phone]        [facility] [address] Office Follow up.   Specialty: Cardiology Why: Coumadin Clinic 7/2 :30 pm ([facility]). Sorry don't have anything in [place].  Contact information: [address] [place] [address] [phone]             The patient has been discharged on:   1.Beta Blocker:  Yes [ yes  ]                              No   [   ]                              If No, reason:  2.Ace Inhibitor/ARB: Yes [   ]                                     No  [  no  ]                                     If No, reason: BP normotensive  3.Statin:   Yes [ yes  ]                  No  [   ]                  If No, reason:  4.Ecasa:  Yes  [ yes  ]                  No   [   ]                  If No, reason:    Signed: Amarii Amy N  Ashleymarie Granderson 09/22/2019, 8:21 AM

## 2019-09-21 NOTE — Significant Event (Signed)
Patient to be transferring to 4E01, taken via wheelchair by [facility]. Patient already has all his belongings that was secured in Security to get his insurance information-patient does not want to store these back with security.    Report called to receiving RN in 4E. Patient decline RN to call family-stated he has already called them to let them know of the transfer. All personal belongings taken to new room and accounted for by patient.      Ilea Hilton

## 2019-09-21 NOTE — Progress Notes (Addendum)
1 Day Post-Op Procedure(s) (LRB): STERNAL RECONSTRUCTION AND STERNAL REWIRING (N/A) RIB PLATING WITH KLS MARTIN STERNAL PLATE (N/A) Subjective: No complaints. Pain well-controlled  Objective: Vital signs in last 24 hours: Temp:  [97.6 F (36.4 C)-98.7 F (37.1 C)] 97.6 F (36.4 C) (06/30 0400) Pulse Rate:  [57-73] 73 (06/30 0700) Cardiac Rhythm: Normal sinus rhythm (06/29 2000) Resp:  [8-23] 15 (06/30 0700) BP: (99-141)/(60-84) 141/71 (06/30 0700) SpO2:  [92 %-100 %] 94 % (06/30 0700) Weight:  [84.9 kg] 84.9 kg (06/30 0600)  Hemodynamic parameters for last 24 hours:    Intake/Output from previous day: 06/29 0701 - 06/30 0700 In: 2648.8 [P.O.:290; I.V.:2058.8; IV Piggyback:300] Out: 2555 [Urine:2325; Blood:100; Chest Tube:130] Intake/Output this shift: No intake/output data recorded.  General appearance: alert and cooperative Neurologic: intact Heart: regular rate and rhythm, S1, S2 normal, no murmur Lungs: clear to auscultation bilaterally Extremities: no edema Wound: dressing dry  Lab Results: Recent Labs    09/21/19 0235  WBC 13.7*  HGB 10.7*  HCT 34.0*  PLT 350   BMET:  Recent Labs    09/21/19 0235  NA 134*  K 4.7  CL 101  CO2 23  GLUCOSE 143*  BUN 16  CREATININE 1.00  CALCIUM 9.1    PT/INR:  Recent Labs    09/20/19 0604  LABPROT 13.9  INR 1.1   ABG    Component Value Date/Time   PHART 7.409 09/16/2019 1059   HCO3 22.2 09/16/2019 1059   TCO2 27 08/05/2019 1859   ACIDBASEDEF 1.8 09/16/2019 1059   O2SAT 98.1 09/16/2019 1059   CBG (last 3)  Recent Labs    09/20/19 2009 09/20/19 2331 09/21/19 0429  GLUCAP 143* 142* 153*   CXR: clear  Assessment/Plan: S/P Procedure(s) (LRB): STERNAL RECONSTRUCTION AND STERNAL REWIRING (N/A) RIB PLATING WITH KLS MARTIN STERNAL PLATE (N/A)  POD 1  Hemodynamically stable in sinus rhythm on amio 200 daily for postop recurrent atrial fib.  He was also on Coumadin which was started after CABG for the  AF and now stopped 5 days preop for the sternal reconstruction. I think it can be resumed at discharge with outpt followup of INR.  DC chest tube, foley.   Continue IS, ambulate.  Transfer to 4E.  If pain is controlled he could go home tomorrow. Will need outpt INR followup and appt with me for one week with a CXR.  He has open leg wound from saphenous vein harvest that was cleaned and dressed in the OR yesterday. This will require daily wet to dry dressing change at home.     LOS: 1 day    Alleen Borne 09/21/2019

## 2019-09-21 NOTE — TOC Initial Note (Signed)
Transition of Care [facility]) - Initial/Assessment Note    Patient Details  Name: Matthew [name] MRN: [id number] Date of Birth: Sep 17, 1955  Transition of Care [facility]) CM/SW Contact:    Epifanio Lesches, RN Phone Number: 09/21/2019, 2:42 PM  Clinical Narrative:   Presents with disrupted sternal closure.Hx of  hypertension, hyperlipidemia, and ongoing heavy tobacco abuse,  status post coronary bypass graft surgery on 08/05/2019.          -s/p sternal reconstruction and sternal rewiring, 6/29  Pt states prior to admit active with [facility], RN,SW. Resumption orders placed per NCM. [facility] made aware of current hospital stay.   States @ d/c will reside alone, recently separated from wife. Pt made NCM aware effective 09/22/2019 new health insurance begins: [facility] [employer] , group # [id number], ID # [id number], RxBin# [id number], RxPCN# [id number]. Pt without insurance card. Information received by insurance agent Carie Caddy.  Pt without DME needs.  TOC team will continue to monitor and follow .....  Expected Discharge Plan: Home w Home Health Services Barriers to Discharge: Continued Medical Work up   Patient Goals and CMS Choice        Expected Discharge Plan and Services Expected Discharge Plan: Home w Home Health Services                                              Prior Living Arrangements/Services                       Activities of Daily Living      Permission Sought/Granted                  Emotional Assessment              Admission diagnosis:  Sternal pain [R07.89] Disruption of closure of sternum or sternotomy [T81.32XA] Patient Active Problem List   Diagnosis Date Noted  . Sternal pain 09/20/2019  . 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 Long term (current) use of anticoagulants 08/19/2019  . Paroxysmal atrial fibrillation (HCC)   . 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 Coronary artery disease 08/05/2019   . CAD S/P percutaneous coronary angioplasty of 100% OM1 08/04/2019  . Ischemic cardiomyopathy 08/04/2019  . Essential hypertension 08/04/2019  . History of stroke 08/04/2019  . Heavy smoker 1 PPD since age 47 08/04/2019  . Hyperlipidemia with target LDL less than 70 08/04/2019  . NSTEMI (non-ST elevated myocardial infarction) (HCC) 08/03/2019  . Multivessel coronary artery disease involving native coronary artery of native heart with unstable angina pectoris (HCC) 08/03/2019   PCP:  Juel Burrow, NP Pharmacy:   [facility] [place], [place] - [address] [place] [address] Phone: [phone] Fax: [phone]  [facility] [place], [place] - [address] [place] [address] Phone: [phone] Fax: [phone]     Social Determinants of Health (SDOH) Interventions    Readmission Risk Interventions Readmission Risk Prevention Plan 08/16/2019  Transportation Screening Complete  PCP or Specialist Appt within 5-7 Days Complete  Home Care Screening Complete  Medication Review (RN CM) Complete

## 2019-09-22 LAB — HEMOGLOBIN A1C
Hgb A1c MFr Bld: 5.6 % (ref 4.8–5.6)
Mean Plasma Glucose: 114 mg/dL

## 2019-09-22 MED ORDER — CEPHALEXIN 500 MG PO CAPS: 500.0000 mg | ORAL_CAPSULE | Freq: Two times a day (BID) | ORAL | 0 refills | Status: DC

## 2019-09-22 MED ORDER — NICOTINE 21 MG/24HR TD PT24: 21.0000 mg | MEDICATED_PATCH | Freq: Every day | TRANSDERMAL | 0 refills | Status: DC

## 2019-09-22 MED FILL — CEPHALEXIN 500 MG CAPS: 500 | 7 days supply | Qty: 14 | Fill #0

## 2019-09-22 NOTE — Discharge Instructions (Signed)
Discharge Instructions:  1. You may shower, please wash incisions daily with soap and water and keep dry.  If you wish to cover wounds with dressing you may do so but please keep clean and change daily.  No tub baths or swimming until incisions have completely healed.  If your incisions become red or develop any drainage please call our office at [phone]  2. No Driving until cleared by our office and you are no longer using narcotic pain medications  3. Monitor your weight daily.. Please use the same scale and weigh at same time... If you gain 5-10 lbs in 48 hours with associated lower extremity swelling, please contact our office at [phone]  4. Fever of 101.5 for at least 24 hours with no source, please contact our office at [phone]  5. Activity- up as tolerated, please walk at least 3 times per day.  Avoid strenuous activity, no lifting, pushing, or pulling with your arms over 8 lbs for a minimum of 6 weeks  6. If any questions or concerns arise, please do not hesitate to contact our office at [phone]  7. Wet to dry dressing on the leg vein incision.

## 2019-09-22 NOTE — Progress Notes (Addendum)
      [address]       Matthew [name] [address]             [phone]      2 Days Post-Op Procedure(s) (LRB): STERNAL RECONSTRUCTION AND STERNAL REWIRING (N/A) RIB PLATING WITH KLS MARTIN STERNAL PLATE (N/A) Subjective: Feels good this morning and he is ready for discharge home  Objective: Vital signs in last 24 hours: Temp:  [98 F (36.7 C)-99.3 F (37.4 C)] 98.4 F (36.9 C) (07/01 0737) Pulse Rate:  [60-74] 62 (07/01 0737) Cardiac Rhythm: Normal sinus rhythm (07/01 0736) Resp:  [9-20] 10 (07/01 0737) BP: (103-122)/(59-78) 115/59 (07/01 0737) SpO2:  [92 %-100 %] 93 % (07/01 0737)     Intake/Output from previous day: 06/30 0701 - 07/01 0700 In: 816 [P.O.:600; I.V.:216] Out: 1030 [Urine:1000; Chest Tube:30] Intake/Output this shift: No intake/output data recorded.  General appearance: alert, cooperative and no distress Heart: regular rate and rhythm, S1, S2 normal, no murmur, click, rub or gallop Lungs: clear to auscultation bilaterally Abdomen: soft, non-tender; bowel sounds normal; no masses,  no organomegaly Extremities: extremities normal, atraumatic, no cyanosis or edema Wound: clean and dry  Lab Results: Recent Labs    09/21/19 0235  WBC 13.7*  HGB 10.7*  HCT 34.0*  PLT 350   BMET:  Recent Labs    09/21/19 0235  NA 134*  K 4.7  CL 101  CO2 23  GLUCOSE 143*  BUN 16  CREATININE 1.00  CALCIUM 9.1    PT/INR:  Recent Labs    09/20/19 0604  LABPROT 13.9  INR 1.1   ABG    Component Value Date/Time   PHART 7.409 09/16/2019 1059   HCO3 22.2 09/16/2019 1059   TCO2 27 08/05/2019 1859   ACIDBASEDEF 1.8 09/16/2019 1059   O2SAT 98.1 09/16/2019 1059   CBG (last 3)  Recent Labs    09/21/19 0429 09/21/19 0842 09/21/19 1128  GLUCAP 153* 99 108*    Assessment/Plan: S/P Procedure(s) (LRB): STERNAL RECONSTRUCTION AND STERNAL REWIRING (N/A) RIB PLATING WITH KLS MARTIN STERNAL PLATE (N/A)  1. Incision healing well. He remains  hemodynamically stable 2. NSR in the 60s, BP well controlled 3. Renal-creatinine 1.00, electrolytes ok 4. H and H 10.7/34.0, expected acute blood loss anemia 5. Endo-A1C is 5.6, blood glucose well controlled  Plan: Home today if okay with Dr. Laneta Simmers. He will need home health set up to assist with his [facility] site dressing change. He has follow-up already arranged with Dr. Laneta Simmers.    LOS: 2 days    Sharlene Dory 09/22/2019    Chart reviewed, patient examined, agree with above. Incision looks ok. He feels well and wants to go home. I will see in the office next week. Will put on Keflex 500 bid for a week.

## 2019-09-22 NOTE — TOC Transition Note (Signed)
Transition of Care (TOC) - CM/SW Discharge Note Donn Pierini RN, BSN Transitions of Care Unit 4E- RN Case Manager See Treatment Team for direct phone #    Patient Details  Name: Matthew [name] MRN: [id number] Date of Birth: 1956/02/10  Transition of Care [facility]) CM/SW Contact:  Darrold Span, RN Phone Number: 09/22/2019, 10:19 AM   Clinical Narrative:    Pt stable for transition home today, was active with [facility] prior to admission, orders have been placed to resume services for HHRN/SW- spoke with Cassie with [facility]- and they will resume [facility] services post discharge.    Final next level of care: Home w Home Health Services Barriers to Discharge: Barriers Resolved   Patient Goals and CMS Choice Patient states their goals for this hospitalization and ongoing recovery are:: return home      Discharge Placement                 Home with [facility]      Discharge Plan and Services   Discharge Planning Services: CM Consult Post Acute Care Choice: Home Health, Resumption of Svcs/PTA Provider                    HH Arranged: RN, Social Work HH Agency: [facility] Date HH Agency Contacted: 09/22/19 Time HH Agency Contacted: 1000 Representative spoke with at [facility] Agency: Cassie  Social Determinants of Health (SDOH) Interventions     Readmission Risk Interventions Readmission Risk Prevention Plan 09/22/2019 08/16/2019  Post Dischage Appt Complete -  Medication Screening Complete -  Transportation Screening Complete Complete  PCP or Specialist Appt within 5-7 Days - Complete  Home Care Screening - Complete  Medication Review (RN CM) - Complete

## 2019-09-22 NOTE — Progress Notes (Signed)
09/22/2019 11:38 AM Discharge AVS meds taken today and those due this evening reviewed.  Follow-up appointments and when to call md reviewed.  D/C IV and TELE.  Questions and concerns addressed.   D/C home per orders. Kathryne Hitch

## 2019-09-23 ENCOUNTER — Telehealth: Payer: Self-pay

## 2019-09-23 ENCOUNTER — Other Ambulatory Visit: Payer: Self-pay | Admitting: Surgery

## 2019-09-23 ENCOUNTER — Other Ambulatory Visit: Payer: Self-pay

## 2019-09-23 DIAGNOSIS — Z951 Presence of aortocoronary bypass graft: Secondary | ICD-10-CM

## 2019-09-23 MED ORDER — LEVOTHYROXINE SODIUM 100 MCG PO TABS: 100.0000 ug | ORAL_TABLET | Freq: Every day | ORAL | 0 refills | Status: DC

## 2019-09-23 MED ORDER — AMIODARONE HCL 200 MG PO TABS: 200.0000 mg | ORAL_TABLET | Freq: Every day | ORAL | 0 refills | Status: DC

## 2019-09-23 MED ORDER — WARFARIN SODIUM 5 MG PO TABS: 5.0000 mg | ORAL_TABLET | Freq: Every day | ORAL | 0 refills | Status: DC

## 2019-09-23 MED ORDER — METOPROLOL TARTRATE 25 MG PO TABS: 12.5000 mg | ORAL_TABLET | Freq: Two times a day (BID) | ORAL | 0 refills | Status: DC

## 2019-09-23 MED ORDER — ATORVASTATIN CALCIUM 80 MG PO TABS: 80.0000 mg | ORAL_TABLET | Freq: Every day | ORAL | 0 refills | Status: DC

## 2019-09-23 NOTE — Telephone Encounter (Signed)
Patient contacted the office and stated that all of his prescription medications were lost at the hospital and he needed refills.  Refills of daily maintence medications were prescribed with no refills given and sent into the [facility] pharmacy.  Dr. Laneta Simmers notified of refills.  Patient was advised to contact his PCP and Cardiologist for future refills.  Patient was unaware of his follow-up appointments.  Went into detail about his appointments day and times.

## 2019-09-23 NOTE — Telephone Encounter (Signed)
Gearldine Bienenstock -nurse from [employer] called to report that she was unable to start home-health care today due to Matthew [name] being out of town. [place] over the holiday weekend. His insurance -[employer]/Medicare requires that the patient be "homebound" to have this service covered. He is not homebound if he is at the beach/ They will be D/C'ing the home health service order has of today.

## 2019-09-27 ENCOUNTER — Ambulatory Visit
Admission: RE | Admit: 2019-09-27 | Discharge: 2019-09-27 | Disposition: A | Discharge status: Home / Self Care | Payer: Medicare Other | Source: Ambulatory Visit | Attending: Surgery | Admitting: Surgery

## 2019-09-27 ENCOUNTER — Other Ambulatory Visit: Payer: Self-pay

## 2019-09-27 ENCOUNTER — Other Ambulatory Visit: Payer: Self-pay | Admitting: Physician Assistant

## 2019-09-27 ENCOUNTER — Inpatient Hospital Stay: Admission: AD | Admit: 2019-09-27 | Payer: Medicare Other | Source: Ambulatory Visit | Admitting: Surgery

## 2019-09-27 ENCOUNTER — Telehealth: Payer: Self-pay

## 2019-09-27 ENCOUNTER — Ambulatory Visit (INDEPENDENT_AMBULATORY_CARE_PROVIDER_SITE_OTHER): Payer: Self-pay | Admitting: Surgical

## 2019-09-27 ENCOUNTER — Encounter: Payer: Self-pay | Admitting: Surgical

## 2019-09-27 VITALS — BP 139/75 | HR 85 | Temp 98.4°F | Resp 28 | Ht 66.0 in | Wt 194.0 lb

## 2019-09-27 DIAGNOSIS — Z951 Presence of aortocoronary bypass graft: Secondary | ICD-10-CM

## 2019-09-27 DIAGNOSIS — I2510 Atherosclerotic heart disease of native coronary artery without angina pectoris: Secondary | ICD-10-CM

## 2019-09-27 DIAGNOSIS — R0789 Other chest pain: Secondary | ICD-10-CM

## 2019-09-27 DIAGNOSIS — Z09 Encounter for follow-up examination after completed treatment for conditions other than malignant neoplasm: Secondary | ICD-10-CM

## 2019-09-27 MED ORDER — FUROSEMIDE 40 MG PO TABS
40.0000 mg | ORAL_TABLET | Freq: Every day | ORAL | 0 refills | Status: DC
Start: 2019-09-27 — End: 2019-09-28

## 2019-09-27 MED ORDER — POTASSIUM CHLORIDE ER 20 MEQ PO TBCR: 20.0000 meq | EXTENDED_RELEASE_TABLET | Freq: Every day | ORAL | 0 refills | Status: DC

## 2019-09-27 NOTE — Progress Notes (Signed)
Pt states, "the hospital lost my medications," so he has not been taking any medicine aside from Keflex and ibuprofen 600 mg bid prn. Pt states his medications are due to arrive to his home by this evening.

## 2019-09-27 NOTE — Telephone Encounter (Signed)
Diane with [facility] contacted the office with a call report.  Patient's chest xray showed concerns for pneumonia.  Patient in the office for appointment.  Dr. Laneta Simmers to review chest xray.

## 2019-09-27 NOTE — Progress Notes (Signed)
[address]       [place] [address]             [phone]      Wallie Lagrand [facility] Medical Record #[id number] Date of Birth: Oct 24, 1955  Referring: Juel Burrow, NP Primary Care: Juel Burrow, NP Primary Cardiologist: Garwin Brothers, MD   Chief Complaint:   POST OP FOLLOW UP  History of Present Illness:    The patient is a 64 year old male well-known to T CTS having undergone previous CABG on 08/05/2019 by Dr. Laneta Simmers.  He had postoperative atrial fibrillation which was managed medically including initiation of Coumadin therapy.  He was also placed on amiodarone.  More recently he was readmitted for sternal wound dehiscence and on 09/20/2019 he underwent KLS plating and rewiring for reconstruction.  The [place] the beach this [place] where he noted increasing drainage from the lower pole of the incision.  Also there was some increase in erythema along the suture line.  He does feel some minor clicking and popping/instability.  He has not had any fevers, chills or other significant constitutional symptoms.  He [place] department [place] and placed on oral clindamycin.  He called our office yesterday evening and we requested that he come to the office for reevaluation.      Past Medical History:  Diagnosis Date  . Burn erythema of lower leg   . Depression   . Dyspnea   . Essential hypertension 08/04/2019  . Heavy smoker 1 PPD since age 71 08/04/2019  . History of stroke 08/04/2019  . Hyperlipidemia with target LDL less than 70 08/04/2019  . Hypertension   . Ischemic cardiomyopathy 08/03/2019   Echo: EF 35-40% with moderately decreased function.  Global HK.  GR 1 DD.  Elevated LAP.  Mildly elevated PA P estimated 39 mmHg.  Mild LA dilation.  Normal CVP/RAP  . Multivessel coronary artery disease involving native coronary artery of native heart with unstable angina pectoris (HCC) 08/03/2019   Cardiac Cath-PTCA: Significant 3V  CAD.  Culprit lesion- 100% mOM1 (PTCA only - 25%), ostial and prox LAD 90%-ostD1 80% (bifurcation), mid to distal RCA 60%.  EF 35-40%.  Moderately elevated LVEDP. -->  PTCA only performed to avoid DAPT requirement (no P2 Y 12 inhibitor).  CVTS consultation for CABG.  . NSTEMI (non-ST elevated myocardial infarction) (HCC) 08/03/2019  . Tobacco use      Social History   Tobacco Use  Smoking Status Current Every Day Smoker  . Packs/day: 1.00  . Years: 46.00  . Pack years: 46.00  . Types: Cigarettes  Smokeless Tobacco Never Used    Social History   Substance and Sexual Activity  Alcohol Use Yes  . Alcohol/week: 2.0 standard drinks  . Types: 2 Cans of beer per week     No Known Allergies  Current Outpatient Medications  Medication Sig Dispense Refill  . cephALEXin (KEFLEX) 500 MG capsule Take 1 capsule (500 mg total) by mouth 2 (two) times daily. 14 capsule 0  . acetaminophen (TYLENOL) 325 MG tablet Take 2 tablets (650 mg total) by mouth every 6 (six) hours as needed for mild pain. (Patient not taking: Reported on 09/08/2019)    . amiodarone (PACERONE) 200 MG tablet Take 1 tablet (200 mg total) by mouth daily. (Patient not taking: Reported on 09/27/2019) 30 tablet 0  . aspirin EC 81 MG EC tablet Take 1 tablet (81 mg total) by mouth  daily. (Patient not taking: Reported on 09/27/2019)    . atorvastatin (LIPITOR) 80 MG tablet Take 1 tablet (80 mg total) by mouth daily at 6 PM. (Patient not taking: Reported on 09/27/2019) 30 tablet 0  . furosemide (LASIX) 40 MG tablet Take 1 tablet (40 mg total) by mouth daily. 7 tablet 0  . levothyroxine (SYNTHROID) 100 MCG tablet Take 1 tablet (100 mcg total) by mouth daily at 6 (six) AM. (Patient not taking: Reported on 09/27/2019) 30 tablet 0  . metoprolol tartrate (LOPRESSOR) 25 MG tablet Take 0.5 tablets (12.5 mg total) by mouth 2 (two) times daily. (Patient not taking: Reported on 09/27/2019) 30 tablet 0  . nicotine (NICODERM CQ - DOSED IN MG/24 HOURS) 21  mg/24hr patch Place 1 patch (21 mg total) onto the skin daily. (Patient not taking: Reported on 09/27/2019) 28 patch 0  . oxycodone (OXY-IR) 5 MG capsule Take 1 capsule (5 mg total) by mouth every 6 (six) hours as needed. (Patient not taking: Reported on 09/27/2019) 40 capsule 0  . potassium chloride 20 MEQ TBCR Take 20 mEq by mouth daily. 7 tablet 0  . warfarin (COUMADIN) 5 MG tablet Take 1 tablet (5 mg total) by mouth daily at 4 PM. Or as directed (Patient not taking: Reported on 09/27/2019) 30 tablet 0   No current facility-administered medications for this visit.       Physical Exam: BP 139/75 (BP Location: Right Arm, Patient Position: Sitting, Cuff Size: Normal)   Pulse 85   Temp 98.4 F (36.9 C) (Temporal)   Resp (!) 28   Ht 5\' 6"  (1.676 m)   Wt 194 lb (88 kg)   SpO2 97% Comment: RA  BMI 31.31 kg/m   General appearance: alert, distracted and no distress Heart: regular rate and rhythm Lungs: clear to auscultation bilaterally Abdomen: benign Extremities: Bilateral pitting edema Wound: Sternal incision has erythema along the suture line as well as a small skin dehiscence with some minor fatty necrosis at the distal aspect of the wound.  There is no definite purulence at this time. The [facility] site below the knee had superficial dehiscence with fatty necrosis.  I debrided this wound and packed in wet-to-dry saline.  Diagnostic Studies & Laboratory data:     Recent Radiology Findings:   DG Chest 2 View  Result Date: 09/27/2019 CLINICAL DATA:  Status post coronary artery bypass grafting. Hypertension EXAM: CHEST - 2 VIEW COMPARISON:  September 21, 2019 FINDINGS: There is ill-defined airspace opacity in the lingula. The lungs elsewhere are clear. Heart size is upper normal with pulmonary vascularity normal. No adenopathy. Patient is status post coronary artery bypass grafting. There is also postoperative change in the lower cervical region. No pneumothorax. IMPRESSION: Airspace opacity in the  lingula concerning for pneumonia in this area. Lungs elsewhere clear. Heart upper normal in size. Postoperative changes noted. These results will be called to the ordering clinician or representative by the Radiologist Assistant, and communication documented in the PACS or September 23, 2019. Electronically Signed   By: [employer] M.D.   On: 09/27/2019 11:32      Recent Lab Findings: Lab Results  Component Value Date   WBC 13.7 (H) 09/21/2019   HGB 10.7 (L) 09/21/2019   HCT 34.0 (L) 09/21/2019   PLT 350 09/21/2019   GLUCOSE 143 (H) 09/21/2019   CHOL 248 (H) 08/04/2019   TRIG 128 08/04/2019   HDL 62 08/04/2019   LDLCALC 160 (H) 08/04/2019   ALT 13 09/16/2019  AST 16 09/16/2019   NA 134 (L) 09/21/2019   K 4.7 09/21/2019   CL 101 09/21/2019   CREATININE 1.00 09/21/2019   BUN 16 09/21/2019   CO2 23 09/21/2019   TSH 13.454 (H) 08/12/2019   INR 1.1 09/20/2019   HGBA1C 5.6 09/21/2019      Assessment / Plan:        Medication Changes: Meds ordered this encounter  Medications  . furosemide (LASIX) 40 MG tablet    Sig: Take 1 tablet (40 mg total) by mouth daily.    Dispense:  7 tablet    Refill:  0    Order Specific Question:   Supervising Provider    Answer:   Laneta Simmers, BRYAN K [[id number]]  . potassium chloride 20 MEQ TBCR    Sig: Take 20 mEq by mouth daily.    Dispense:  7 tablet    Refill:  0    Order Specific Question:   Supervising Provider    Answer:   Evelene Croon K [[id number]]     Early cellulitis of the sternal incision with some loose screws from the KLS system.  The sternum is slightly movable in the distal aspect with cough but mostly feels pretty firm and stable.  I recommended the patient that he be admitted for intravenous antibiotics and close observation of the wound.  He understands that it could potentially require further surgical drainage but at this time he refuses hospitalization.  He does state that he will continue his oral antibiotics which she is  currently clindamycin 300 mg 4 times daily.  He also reports that he has not been taking any of his home medications as "the hospital lost them".  This includes Coumadin and amiodarone.  Also he was on Keflex at the time of discharge which she has not been taking.  I told him he can take both the clindamycin and Keflex when he restarts his medication today.  We will schedule an appointment in 1 week to see Dr. Derinda Late I gave him signs and symptoms to be acutely aware of including fevers, chills, diaphoresis, worsening of the redness of the incision, increasing drainage or sternal instability that this would require urgent reevaluation in the emergency department or our office.  We discussed several times that I recommend admission and he continuously refuses that at this time.  I also instructed him on wet-to-dry dressing changes for his EVH site.  Also as he has significant edema I gave him a prescription for 40 mg of Lasix and potassium chloride 20 mEq daily for 7 days.   Rowe Clack, PA-C 09/27/2019 1:03 PM

## 2019-09-27 NOTE — Patient Instructions (Signed)
Discussed wet-to-dry dressings of the [facility] site as well as signs and symptoms to monitor for worsening infection of the sternal incision

## 2019-09-28 ENCOUNTER — Other Ambulatory Visit: Payer: Self-pay | Admitting: Surgical

## 2019-09-28 ENCOUNTER — Inpatient Hospital Stay (HOSPITAL_COMMUNITY)
Admission: AD | Admit: 2019-09-28 | Discharge: 2019-10-17 | DRG: 902 | Disposition: A | Discharge status: Home / Self Care | Payer: Medicare Other | Source: Other Acute Inpatient Hospital | Attending: Surgery | Admitting: Surgery

## 2019-09-28 DIAGNOSIS — Z8673 Personal history of transient ischemic attack (TIA), and cerebral infarction without residual deficits: Secondary | ICD-10-CM | POA: Diagnosis not present

## 2019-09-28 DIAGNOSIS — I9789 Other postprocedural complications and disorders of the circulatory system, not elsewhere classified: Secondary | ICD-10-CM | POA: Diagnosis not present

## 2019-09-28 DIAGNOSIS — E8770 Fluid overload, unspecified: Secondary | ICD-10-CM | POA: Diagnosis not present

## 2019-09-28 DIAGNOSIS — F17210 Nicotine dependence, cigarettes, uncomplicated: Secondary | ICD-10-CM | POA: Diagnosis present

## 2019-09-28 DIAGNOSIS — Z79899 Other long term (current) drug therapy: Secondary | ICD-10-CM

## 2019-09-28 DIAGNOSIS — D62 Acute posthemorrhagic anemia: Secondary | ICD-10-CM | POA: Diagnosis not present

## 2019-09-28 DIAGNOSIS — F1010 Alcohol abuse, uncomplicated: Secondary | ICD-10-CM | POA: Diagnosis present

## 2019-09-28 DIAGNOSIS — K5900 Constipation, unspecified: Secondary | ICD-10-CM | POA: Diagnosis not present

## 2019-09-28 DIAGNOSIS — I454 Nonspecific intraventricular block: Secondary | ICD-10-CM | POA: Diagnosis present

## 2019-09-28 DIAGNOSIS — I10 Essential (primary) hypertension: Secondary | ICD-10-CM | POA: Diagnosis present

## 2019-09-28 DIAGNOSIS — Z590 Homelessness: Secondary | ICD-10-CM | POA: Diagnosis not present

## 2019-09-28 DIAGNOSIS — T84218A Breakdown (mechanical) of internal fixation device of other bones, initial encounter: Secondary | ICD-10-CM | POA: Diagnosis present

## 2019-09-28 DIAGNOSIS — S21101A Unspecified open wound of right front wall of thorax without penetration into thoracic cavity, initial encounter: Secondary | ICD-10-CM | POA: Diagnosis present

## 2019-09-28 DIAGNOSIS — Y828 Other medical devices associated with adverse incidents: Secondary | ICD-10-CM | POA: Diagnosis present

## 2019-09-28 DIAGNOSIS — J449 Chronic obstructive pulmonary disease, unspecified: Secondary | ICD-10-CM | POA: Diagnosis present

## 2019-09-28 DIAGNOSIS — S2190XA Unspecified open wound of unspecified part of thorax, initial encounter: Secondary | ICD-10-CM | POA: Diagnosis not present

## 2019-09-28 DIAGNOSIS — Z833 Family history of diabetes mellitus: Secondary | ICD-10-CM | POA: Diagnosis not present

## 2019-09-28 DIAGNOSIS — I255 Ischemic cardiomyopathy: Secondary | ICD-10-CM | POA: Diagnosis present

## 2019-09-28 DIAGNOSIS — Z809 Family history of malignant neoplasm, unspecified: Secondary | ICD-10-CM

## 2019-09-28 DIAGNOSIS — I2510 Atherosclerotic heart disease of native coronary artery without angina pectoris: Secondary | ICD-10-CM | POA: Diagnosis present

## 2019-09-28 DIAGNOSIS — I252 Old myocardial infarction: Secondary | ICD-10-CM

## 2019-09-28 DIAGNOSIS — R0781 Pleurodynia: Secondary | ICD-10-CM

## 2019-09-28 DIAGNOSIS — Z79890 Hormone replacement therapy (postmenopausal): Secondary | ICD-10-CM | POA: Diagnosis not present

## 2019-09-28 DIAGNOSIS — I480 Paroxysmal atrial fibrillation: Secondary | ICD-10-CM | POA: Diagnosis present

## 2019-09-28 DIAGNOSIS — E785 Hyperlipidemia, unspecified: Secondary | ICD-10-CM | POA: Diagnosis present

## 2019-09-28 DIAGNOSIS — L089 Local infection of the skin and subcutaneous tissue, unspecified: Secondary | ICD-10-CM | POA: Diagnosis present

## 2019-09-28 DIAGNOSIS — Z9861 Coronary angioplasty status: Secondary | ICD-10-CM

## 2019-09-28 DIAGNOSIS — T8132XA Disruption of internal operation (surgical) wound, not elsewhere classified, initial encounter: Principal | ICD-10-CM | POA: Diagnosis present

## 2019-09-28 DIAGNOSIS — E8881 Metabolic syndrome: Secondary | ICD-10-CM | POA: Diagnosis present

## 2019-09-28 DIAGNOSIS — T8130XA Disruption of wound, unspecified, initial encounter: Secondary | ICD-10-CM | POA: Diagnosis present

## 2019-09-28 MED ORDER — TRAMADOL HCL 50 MG PO TABS
50.0000 mg | ORAL_TABLET | Freq: Three times a day (TID) | ORAL | Status: DC | PRN
  Administered 2019-09-29 – 2019-10-01 (×6): 50 mg via ORAL
  Filled 2019-09-28 (×6): qty 1

## 2019-09-28 MED ORDER — VANCOMYCIN HCL 1500 MG/300ML IV SOLN
1500.0000 mg | INTRAVENOUS | Status: AC
  Administered 2019-09-29: 1500 mg via INTRAVENOUS
  Filled 2019-09-28: qty 300

## 2019-09-28 MED ORDER — ATORVASTATIN CALCIUM 80 MG PO TABS
80.0000 mg | ORAL_TABLET | Freq: Every day | ORAL | Status: DC
  Administered 2019-09-29 – 2019-10-17 (×18): 80 mg via ORAL
  Filled 2019-09-28 (×18): qty 1

## 2019-09-28 MED ORDER — METOPROLOL TARTRATE 12.5 MG HALF TABLET
12.5000 mg | ORAL_TABLET | Freq: Two times a day (BID) | ORAL | Status: DC
  Administered 2019-09-30 – 2019-10-02 (×3): 12.5 mg via ORAL
  Filled 2019-09-28 (×5): qty 1

## 2019-09-28 MED ORDER — VANCOMYCIN HCL IN DEXTROSE 1-5 GM/200ML-% IV SOLN
1000.0000 mg | Freq: Two times a day (BID) | INTRAVENOUS | Status: DC
  Administered 2019-09-29 – 2019-10-03 (×9): 1000 mg via INTRAVENOUS
  Filled 2019-09-28 (×10): qty 200

## 2019-09-28 MED ORDER — ASPIRIN EC 81 MG PO TBEC
81.0000 mg | DELAYED_RELEASE_TABLET | Freq: Every day | ORAL | Status: DC
  Administered 2019-09-29 – 2019-10-17 (×18): 81 mg via ORAL
  Filled 2019-09-28 (×18): qty 1

## 2019-09-28 MED ORDER — POTASSIUM CHLORIDE CRYS ER 20 MEQ PO TBCR
20.0000 meq | EXTENDED_RELEASE_TABLET | Freq: Every day | ORAL | Status: DC
  Administered 2019-09-29 – 2019-10-01 (×3): 20 meq via ORAL
  Filled 2019-09-28 (×4): qty 1

## 2019-09-28 MED ORDER — FUROSEMIDE 40 MG PO TABS
40.0000 mg | ORAL_TABLET | Freq: Every day | ORAL | Status: DC
  Administered 2019-09-29 – 2019-10-01 (×3): 40 mg via ORAL
  Filled 2019-09-28 (×4): qty 1

## 2019-09-28 MED ORDER — SENNOSIDES-DOCUSATE SODIUM 8.6-50 MG PO TABS
1.0000 | ORAL_TABLET | Freq: Every evening | ORAL | Status: DC | PRN
  Administered 2019-10-03 – 2019-10-12 (×3): 1 via ORAL
  Filled 2019-09-28 (×3): qty 1

## 2019-09-28 MED ORDER — AMIODARONE HCL 200 MG PO TABS
200.0000 mg | ORAL_TABLET | Freq: Every day | ORAL | Status: DC
  Administered 2019-09-29 – 2019-10-09 (×11): 200 mg via ORAL
  Filled 2019-09-28 (×11): qty 1

## 2019-09-28 MED ORDER — ONDANSETRON HCL 4 MG/2ML IJ SOLN: 4.0000 mg | Freq: Four times a day (QID) | INTRAMUSCULAR | Status: DC | PRN

## 2019-09-28 MED ORDER — SODIUM CHLORIDE 0.9% FLUSH
3.0000 mL | Freq: Two times a day (BID) | INTRAVENOUS | Status: DC
  Administered 2019-09-29 – 2019-10-17 (×35): 3 mL via INTRAVENOUS

## 2019-09-28 MED ORDER — ONDANSETRON HCL 4 MG PO TABS: 4.0000 mg | ORAL_TABLET | Freq: Four times a day (QID) | ORAL | Status: DC | PRN

## 2019-09-28 NOTE — Progress Notes (Signed)
Pharmacy Antibiotic Note  Matthew [name] is a 64 y.o. male admitted on 09/28/2019 with sternal wound cellulitis.  Pharmacy has been consulted for Vancomycin dosing.  Plan: Vancomycin 1500mg  IV now then 1000 mg IV Q 12 hrs.  Will f/u renal function, micro data, and pt's clinical condition Vanc levels prn     Temp (24hrs), Avg:98.2 F (36.8 C), Min:98.2 F (36.8 C), Max:98.2 F (36.8 C)  No results for input(s): WBC, CREATININE, LATICACIDVEN, VANCOTROUGH, VANCOPEAK, VANCORANDOM, GENTTROUGH, GENTPEAK, GENTRANDOM, TOBRATROUGH, TOBRAPEAK, TOBRARND, AMIKACINPEAK, AMIKACINTROU, AMIKACIN in the last 168 hours.  Estimated Creatinine Clearance: 78.6 mL/min (by C-G formula based on SCr of 1 mg/dL).    No Known Allergies  Antimicrobials this admission: 7/8 Vanc >>   Microbiology results: Sternum wound: MRSA PCR:  Thank you for allowing pharmacy to be a part of this patients care.  9/8, PharmD, BCPS Please see amion for complete clinical pharmacist phone list 09/28/2019 11:48 PM

## 2019-09-29 ENCOUNTER — Ambulatory Visit: Payer: Medicare Other | Admitting: Cardiology

## 2019-09-29 LAB — CBC
HCT: 28.6 % — ABNORMAL LOW (ref 39.0–52.0)
Hemoglobin: 9.2 g/dL — ABNORMAL LOW (ref 13.0–17.0)
MCH: 29.7 pg (ref 26.0–34.0)
MCHC: 32.2 g/dL (ref 30.0–36.0)
MCV: 92.3 fL (ref 80.0–100.0)
Platelets: 363 10*3/uL (ref 150–400)
RBC: 3.1 MIL/uL — ABNORMAL LOW (ref 4.22–5.81)
RDW: 14.2 % (ref 11.5–15.5)
WBC: 7.7 10*3/uL (ref 4.0–10.5)
nRBC: 0 % (ref 0.0–0.2)

## 2019-09-29 LAB — BASIC METABOLIC PANEL
Anion gap: 8 (ref 5–15)
BUN: 9 mg/dL (ref 8–23)
CO2: 25 mmol/L (ref 22–32)
Calcium: 8.6 mg/dL — ABNORMAL LOW (ref 8.9–10.3)
Chloride: 101 mmol/L (ref 98–111)
Creatinine, Ser: 0.96 mg/dL (ref 0.61–1.24)
GFR calc Af Amer: >60 mL/min (ref >60)
GFR calc non Af Amer: >60 mL/min (ref >60)
Glucose, Bld: 109 mg/dL — ABNORMAL HIGH (ref 70–99)
Potassium: 4 mmol/L (ref 3.5–5.1)
Sodium: 134 mmol/L — ABNORMAL LOW (ref 135–145)

## 2019-09-29 LAB — MRSA PCR SCREENING: MRSA by PCR: NEGATIVE

## 2019-09-29 LAB — HEMOGLOBIN A1C
Hgb A1c MFr Bld: 5.7 % — ABNORMAL HIGH (ref 4.8–5.6)
Mean Plasma Glucose: 116.89 mg/dL

## 2019-09-29 LAB — PROTIME-INR
INR: 1.2 (ref 0.8–1.2)
Prothrombin Time: 14.3 seconds (ref 11.4–15.2)

## 2019-09-29 MED ORDER — VARENICLINE TARTRATE 0.5 MG PO TABS
0.5000 mg | ORAL_TABLET | Freq: Two times a day (BID) | ORAL | Status: AC
  Administered 2019-10-02 – 2019-10-05 (×7): 0.5 mg via ORAL
  Filled 2019-09-29 (×8): qty 1

## 2019-09-29 MED ORDER — NICOTINE 14 MG/24HR TD PT24
14.0000 mg | MEDICATED_PATCH | Freq: Every day | TRANSDERMAL | Status: DC
  Administered 2019-09-29 – 2019-10-17 (×19): 14 mg via TRANSDERMAL
  Filled 2019-09-29 (×19): qty 1

## 2019-09-29 MED ORDER — SODIUM CHLORIDE 0.9 % IV SOLN
2.0000 g | Freq: Three times a day (TID) | INTRAVENOUS | Status: DC
  Administered 2019-09-29 – 2019-10-03 (×11): 2 g via INTRAVENOUS
  Filled 2019-09-29 (×15): qty 2

## 2019-09-29 MED ORDER — VARENICLINE TARTRATE 0.5 MG PO TABS
0.5000 mg | ORAL_TABLET | Freq: Every day | ORAL | Status: AC
  Administered 2019-09-29 – 2019-10-01 (×3): 0.5 mg via ORAL
  Filled 2019-09-29 (×3): qty 1

## 2019-09-29 MED ORDER — HYDROCOD POLST-CPM POLST ER 10-8 MG/5ML PO SUER
5.0000 mL | Freq: Two times a day (BID) | ORAL | Status: DC | PRN
  Administered 2019-09-29 – 2019-10-16 (×3): 5 mL via ORAL
  Filled 2019-09-29 (×3): qty 5

## 2019-09-29 MED ORDER — VARENICLINE TARTRATE 1 MG PO TABS
1.0000 mg | ORAL_TABLET | Freq: Two times a day (BID) | ORAL | Status: DC
  Administered 2019-10-06 – 2019-10-11 (×11): 1 mg via ORAL
  Filled 2019-09-29 (×24): qty 1

## 2019-09-29 NOTE — Progress Notes (Signed)
0825: sternal wound swabbed, 1.5cm tunneling noted at 12:00. ABD dressing saturated with serosangious drainage. Pink-tinged, frothy/bubbling drainage noted on inspiration/expiration. Redressed wound with ABD and medipore tape. See wound description in flowsheets.

## 2019-09-29 NOTE — Progress Notes (Signed)
Pharmacy Antibiotic Note  Matthew [name] is a 64 y.o. male admitted on 09/28/2019 with wound infection.  Pharmacy has been consulted for [facility] dosing.  Plan: - Fortaz 2G IV Q 8 hrs for wound infection per physician.  - Will f/u renal function for dose adjustments as necessary, micro data, and pt's clinical condition.   Height: 5\' 6"  (167.6 cm) Weight: 86.9 kg (191 lb 9.3 oz) IBW/kg (Calculated) : 63.8  Temp (24hrs), Avg:98 F (36.7 C), Min:97.5 F (36.4 C), Max:98.2 F (36.8 C)  Recent Labs  Lab 09/29/19 0039  WBC 7.7  CREATININE 0.96    Estimated Creatinine Clearance: 81.3 mL/min (by C-G formula based on SCr of 0.96 mg/dL).    No Known Allergies  Antimicrobials this admission: Vanc 7/7>>> 11/30/19 7/8>>>   Microbiology results: 7/7 MRSA PCR: NEG  Thank you for allowing pharmacy to be a part of this patient's care.  9/7, PharmD, MBA Pharmacy Resident [phone] 09/29/2019 9:13 AM

## 2019-09-29 NOTE — H&P (Signed)
Matthew [name] is an 64 y.o. male.   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  He had a slow postoperative course due to preoperative and postoperative atrial fibrillation ultimately requiring anticoagulation, heavy smoking with COPD and possible pneumonia on chest x-ray, and oxygen dependence.  He was ultimately discharged on postop day 11.  He continued to smoke 1 pack/day after discharge and was noncompliant with sternal precautions and returned to see me complaining of some right chest pain and was noted to have instability of the lower half of the sternum.  The incision was healing well without signs of infection.  He was taken back to the operating room on 09/20/2019 for sternal reconstruction.  At the time of surgery he was found to have most of the sternal wires pulling through the left side of the sternum.  The manubrium was slightly separated with the wires had not pulled through the manubrium.  All the sternal wires were removed and the sternum was reconstructed using longitudinal KLS Martin sternal plates followed by sternal wire placement around the plates.  The patient did well with that and wondered home the following day.  He has continued to smoke and been noncompliant with sternal precautions and [place] reporting that he was having some drainage from the lower portion of his chest incision and has noted some motion in the sternum.  He was seen back in the office on Tuesday by our physician assistant and it was felt that he should be admitted for intravenous antibiotics for cellulitis and further evaluation of his sternum.  The patient refused admission and left  the office.  Then yesterday he presented to the hospital in [facility] in the emergency room called our office and requested that the patient be transferred back to [facility].  He had a CT scan of the chest there which I reviewed and showed recurrent dehiscence of the sternum.  He was transferred to our service by [facility].   Past Medical History:  Diagnosis Date  . Burn erythema of lower leg   . Depression   . Dyspnea   . Essential hypertension 08/04/2019  . Heavy smoker 1 PPD since age 60 08/04/2019  . History of stroke 08/04/2019  . Hyperlipidemia with target LDL less than 70 08/04/2019  . Hypertension   . Ischemic cardiomyopathy 08/03/2019   Echo: EF 35-40% with moderately decreased function.  Global HK.  GR 1 DD.  Elevated LAP.  Mildly elevated PA P estimated 39 mmHg.  Mild LA dilation.  Normal CVP/RAP  . Multivessel coronary artery disease involving native coronary artery of native heart with unstable angina pectoris (HCC) 08/03/2019   Cardiac Cath-PTCA: Significant 3V CAD.  Culprit lesion- 100% mOM1 (PTCA only - 25%), ostial and prox LAD 90%-ostD1 80% (bifurcation), mid to distal RCA 60%.  EF 35-40%.  Moderately elevated LVEDP. -->  PTCA only performed to avoid DAPT requirement (no P2 Y 12 inhibitor).  CVTS consultation for CABG.  . NSTEMI (non-ST elevated myocardial infarction) (HCC) 08/03/2019  . Tobacco use     Past Surgical History:  Procedure Laterality Date  . BACK SURGERY    . CARPAL TUNNEL RELEASE    . COLON SURGERY    .  CORONARY ANGIOPLASTY  08/03/2019   Dr. Romilda Joy: PTCA only 100% OM1 in setting of non-STEMI; plan for multivessel CABG  . CORONARY ARTERY BYPASS GRAFT N/A 08/05/2019   Procedure: CORONARY ARTERY BYPASS GRAFTING (CABG), ON PUMP, TIMES THREE, USING LEFT INTERNAL MAMMARY ARTERY AND ENDOSCOPICALLY HARVESTED LEFT GREATER SAPHENOUS VEIN;  Surgeon: Alleen Borne, MD;  Location: [facility] OR;  Service: Open Heart Surgery;  Laterality: N/A;  LIMA to LAD,  SVG to DIAGONAL 1, SVG to OM1  . CORONARY BALLOON ANGIOPLASTY N/A 08/03/2019   Procedure: CORONARY BALLOON ANGIOPLASTY;  Surgeon: Iran Ouch, MD;  Location: [facility] INVASIVE CV LAB;  Service: Cardiovascular;  Laterality: N/A;  . JOINT REPLACEMENT    . LEFT HEART CATH AND CORONARY ANGIOGRAPHY  08/03/2019   Dr. Kirke Corin: Significant 3V CAD.  Culprit lesion- 100% mOM1 (PTCA only - 25%), ostial and prox LAD 90%-ostD1 80% (bifurcation), mid to distal RCA 60%.  EF 35-40%.  Moderately elevated LVEDP. -->  PTCA only performed to avoid DAPT requirement (no P2 Y 12 inhibitor).  CVTS consultation for CABG.   . LEFT HEART CATH AND CORONARY ANGIOGRAPHY N/A 08/03/2019   Procedure: LEFT HEART CATH AND CORONARY ANGIOGRAPHY;  Surgeon: Iran Ouch, MD;  Location: [facility] INVASIVE CV LAB;  Service: Cardiovascular;  Laterality: N/A;  . REPLACEMENT TOTAL KNEE    . RIB PLATING N/A 09/20/2019   Procedure: RIB PLATING WITH KLS MARTIN STERNAL PLATE;  Surgeon: Alleen Borne, MD;  Location: [facility] OR;  Service: Thoracic;  Laterality: N/A;  . STERNAL INCISION RECLOSURE N/A 09/20/2019   Procedure: STERNAL RECONSTRUCTION AND STERNAL REWIRING;  Surgeon: Alleen Borne, MD;  Location: [facility] OR;  Service: Thoracic;  Laterality: N/A;  . TEE WITHOUT CARDIOVERSION N/A 08/05/2019   Procedure: TRANSESOPHAGEAL ECHOCARDIOGRAM (TEE);  Surgeon: Alleen Borne, MD;  Location: [facility] OR;  Service: Open Heart Surgery;  Laterality: N/A;  . TRANSTHORACIC ECHOCARDIOGRAM  08/03/2019    EF 35-40% with moderately decreased function.  Global HK.  GR 1 DD.  Elevated LAP.  Mildly elevated PA P estimated 39 mmHg.  Mild LA dilation.  Normal CVP/RAP    Family History  Problem Relation Age of Onset  . Diabetes Mother   . Cancer Father    Social History:  reports that he has been smoking cigarettes. He has a 46.00 pack-year smoking history. He has never used smokeless tobacco. He reports current alcohol use of about 2.0 standard drinks of alcohol per week. He  reports that he does not use drugs.  Allergies: No Known Allergies  Medications Prior to Admission  Medication Sig Dispense Refill  . acetaminophen (TYLENOL) 325 MG tablet Take 2 tablets (650 mg total) by mouth every 6 (six) hours as needed for mild pain.    Marland Kitchen levothyroxine (SYNTHROID) 100 MCG tablet Take 1 tablet (100 mcg total) by mouth daily at 6 (six) AM. 30 tablet 0  . metoprolol tartrate (LOPRESSOR) 25 MG tablet Take 12.5 mg by mouth 2 (two) times daily.     Marland Kitchen oxycodone (OXY-IR) 5 MG capsule Take 1 capsule (5 mg total) by mouth every 6 (six) hours as needed. 40 capsule 0  . warfarin (COUMADIN) 5 MG tablet Take 5 mg by mouth daily.    . nicotine (NICODERM CQ - DOSED IN MG/24 HOURS) 21 mg/24hr patch Place 1 patch (21 mg total) onto the skin daily. (Patient not taking: Reported on 09/29/2019) 28 patch 0  . potassium chloride 20 MEQ TBCR Take 20 mEq by mouth daily. (  Patient not taking: Reported on 09/29/2019) 7 tablet 0    Results for orders placed or performed during the hospital encounter of 09/28/19 (from the past 48 hour(s))  MRSA PCR Screening     Status: None   Collection Time: 09/28/19 11:45 PM   Specimen: Nasopharyngeal  Result Value Ref Range   MRSA by PCR NEGATIVE NEGATIVE    Comment:        The GeneXpert MRSA Assay (FDA approved for NASAL specimens only), is one component of a comprehensive MRSA colonization surveillance program. It is not intended to diagnose MRSA infection nor to guide or monitor treatment for MRSA infections. Performed at [facility], [address] [place], [place] [address]   Protime-INR     Status: None   Collection Time: 09/29/19 12:39 AM  Result Value Ref Range   Prothrombin Time 14.3 11.4 - 15.2 seconds   INR 1.2 0.8 - 1.2    Comment: (NOTE) INR goal varies based on device and disease states. Performed at [facility], [address] [place], [place] [address]   Basic metabolic panel     Status: Abnormal   Collection Time:  09/29/19 12:39 AM  Result Value Ref Range   Sodium 134 (L) 135 - 145 mmol/L   Potassium 4.0 3.5 - 5.1 mmol/L   Chloride 101 98 - 111 mmol/L   CO2 25 22 - 32 mmol/L   Glucose, Bld 109 (H) 70 - 99 mg/dL    Comment: Glucose reference range applies only to samples taken after fasting for at least 8 hours.   BUN 9 8 - 23 mg/dL   Creatinine, Ser 2.840.96 0.61 - 1.24 mg/dL   Calcium 8.6 (L) 8.9 - 10.3 mg/dL   GFR calc non Af Amer >60 >60 mL/min   GFR calc Af Amer >60 >60 mL/min   Anion gap 8 5 - 15    Comment: Performed at [facility], [address] [place], [place] [address]  CBC     Status: Abnormal   Collection Time: 09/29/19 12:39 AM  Result Value Ref Range   WBC 7.7 4.0 - 10.5 K/uL   RBC 3.10 (L) 4.22 - 5.81 MIL/uL   Hemoglobin 9.2 (L) 13.0 - 17.0 g/dL   HCT 01.028.6 (L) 39 - 52 %   MCV 92.3 80.0 - 100.0 fL   MCH 29.7 26.0 - 34.0 pg   MCHC 32.2 30.0 - 36.0 g/dL   RDW 27.214.2 53.611.5 - 64.415.5 %   Platelets 363 150 - 400 K/uL   nRBC 0.0 0.0 - 0.2 %    Comment: Performed at [facility], [address] [place], [place] [address]  Hemoglobin A1c     Status: Abnormal   Collection Time: 09/29/19 12:39 AM  Result Value Ref Range   Hgb A1c MFr Bld 5.7 (H) 4.8 - 5.6 %    Comment: (NOTE) Pre diabetes:          5.7%-6.4%  Diabetes:              >6.4%  Glycemic control for   <7.0% adults with diabetes    Mean Plasma Glucose 116.89 mg/dL    Comment: Performed at [facility], [address] [place], [place] [address]  Aerobic/Anaerobic Culture (surgical/deep wound)     Status: None (Preliminary result)   Collection Time: 09/29/19  8:30 AM   Specimen: Wound  Result Value Ref Range   Specimen Description WOUND STERNUM  Special Requests NONE    Gram Stain      NO WBC SEEN NO ORGANISMS SEEN Performed at [facility], [address] [place], [place] [address]    Culture PENDING    Report Status PENDING    No results found.  Review of Systems  Constitutional:  Negative for activity change, chills, fatigue and fever.  HENT: Negative.   Eyes: Negative.   Respiratory: Positive for cough, chest tightness and shortness of breath.   Cardiovascular: Positive for chest pain and leg swelling.  Gastrointestinal: Negative.   Endocrine: Negative.   Genitourinary: Negative.   Musculoskeletal: Negative.   Neurological: Negative.   Hematological: Negative.   Psychiatric/Behavioral:       Depression and substance abuse    Blood pressure 116/67, pulse 65, temperature (!) 97.4 F (36.3 C), temperature source Oral, resp. rate 19, height 5\' 6"  (1.676 m), weight 86.9 kg, SpO2 92 %. Physical Exam Constitutional:      Appearance: Normal appearance. He is normal weight.  HENT:     Head: Normocephalic and atraumatic.  Eyes:     Extraocular Movements: Extraocular movements intact.     Pupils: Pupils are equal, round, and reactive to light.  Cardiovascular:     Rate and Rhythm: Normal rate and regular rhythm.     Pulses: Normal pulses.     Heart sounds: Normal heart sounds. No murmur heard.   Pulmonary:     Effort: Pulmonary effort is normal.     Breath sounds: Normal breath sounds.     Comments: Chest incision is intact for the most part with a small opening in the lower third with some serous drainage.  The sternum is grossly unstable but the manubrium appears stable. Chest:     Chest wall: Tenderness present.  Abdominal:     General: Abdomen is flat.     Palpations: Abdomen is soft.  Musculoskeletal:        General: Swelling present.     Cervical back: Normal range of motion and neck supple.  Skin:    General: Skin is warm and dry.  Neurological:     General: No focal deficit present.     Mental Status: He is alert and oriented to person, place, and time.  Psychiatric:        Mood and Affect: Mood normal.        Behavior: Behavior normal.      Assessment/Plan  This 64 year old gentleman has recurrent dehiscence of his sternum status post  coronary bypass graft surgery and reconstruction of the sternum with KLS Martin sternal plates and double sternal wires.  He has mild erythema along the sternotomy incision which could be early cellulitis.  He has remained afebrile with a normal white blood cell count.  This will require debridement under anesthesia in the operating room with wound VAC placement.  And will likely require reconstruction with a muscle flap I will ask plastic and reconstructive surgery to see him.  I discussed the operative procedure of debridement and wound VAC placement with him including alternatives, benefits, and risks including but not limited to bleeding, blood transfusion, possible need for recurrent debridement in the operating room and possible need for further surgery to reconstruct the sternum.  He understands agrees to proceed.  65, MD 09/29/2019, 4:05 PM

## 2019-09-29 NOTE — Progress Notes (Signed)
  Subjective: No specific complaints. Just ate breakfast.  Objective: Vital signs in last 24 hours: Temp:  [97.5 F (36.4 C)-98.2 F (36.8 C)] 97.5 F (36.4 C) (07/08 0733) Pulse Rate:  [64-74] 72 (07/08 0733) Cardiac Rhythm: Normal sinus rhythm (07/08 0721) Resp:  [13-19] 13 (07/08 0733) BP: (110-115)/(62-68) 110/66 (07/08 0733) SpO2:  [95 %-100 %] 100 % (07/08 0733) Weight:  [86.9 kg] 86.9 kg (07/08 0627)  Hemodynamic parameters for last 24 hours:    Intake/Output from previous day: 07/07 0701 - 07/08 0700 In: 780 [P.O.:480; IV Piggyback:300] Out: 550 [Urine:550] Intake/Output this shift: No intake/output data recorded.  General appearance: alert and cooperative Heart: regular rate and rhythm, S1, S2 normal, no murmur, click, rub or gallop Lungs: clear to auscultation bilaterally Extremities: edema mild in legs.  Wound: chest dressing intact. minimal drainage on dressing. Not removed this am. lower sternum unstable.  Lab Results: Recent Labs    09/29/19 0039  WBC 7.7  HGB 9.2*  HCT 28.6*  PLT 363   BMET:  Recent Labs    09/29/19 0039  NA 134*  K 4.0  CL 101  CO2 25  GLUCOSE 109*  BUN 9  CREATININE 0.96  CALCIUM 8.6*    PT/INR:  Recent Labs    09/29/19 0039  LABPROT 14.3  INR 1.2   ABG    Component Value Date/Time   PHART 7.409 09/16/2019 1059   HCO3 22.2 09/16/2019 1059   TCO2 27 08/05/2019 1859   ACIDBASEDEF 1.8 09/16/2019 1059   O2SAT 98.1 09/16/2019 1059   CBG (last 3)  No results for input(s): GLUCAP in the last 72 hours.  Assessment/Plan:  CT from [place] reviewed on PACS and shows disruption of sternal reconstruction. The manubrium is intact. This will require debridement and wound VAC with possible muscle flap reconstruction later. Unfortunately he ate a full breakfast this am so will have to wait until tomorrow to do this. Will add Fortaz to vanc to broaden antibiotic coverage.   LOS: 1 day    Alleen Borne 09/29/2019

## 2019-09-29 NOTE — Hospital Course (Addendum)
Matthew [name] is well known to [facility].  He is S/P CABG x 3 performed 08/05/2019 by Dr. Laneta Simmers.  He dehisced his sternum and required sternal reconstruction on 09/20/2019.  The patient presented to our office on 09/27/2019 at which time he complained of sternal drainage from the lower portion of his sternal incision.  He also noted some increase in sternal erythema along the suture line.  The patient admitted to feeling some minor sternal clicking and "popping."  The patient denied fevers, chills, or significant symptoms.  The patient presented to an OSH emergency department and he was provided Clindamycin.  He also underwent CT scan of his chest which showed disruption of sternal reconstruction.  Patient finally admitted to be non-compliant on taking his previously prescribed medications, stating the hospital lost them.  At the time of clinic evaluation it was recommended the patient be admitted for IV antibiotics and possible surgical intervention.  The patient was unwilling to be admitted at that time.  Unfortunately, the patient again contact our office that his sternal incision had now reopened.  He was agreeable to be admitted to the hospital for further care.   Hospital course:  Matthew [name] was admitted to the hospital on 09/28/2019.  He was started on broad spectrum IV Antibiotics.  Review of his previous CT scan confirmed the patient would require surgical intervention with wound vac placement and possible muscle flap reconstruction.  The risks and benefits of the procedure were explained to the patient and he was agreeable to proceed.  He was taken to the operating room on 09/30/2019.  He underwent debridement of his sternal incision and wound vac placement with Dr. Laneta Simmers.  He was also seen in consultation by plastic surgery, Dr. Ulice Bold who felt the patient would benefit from muscle turnover flap with further management of the wound.  On 10/05/2019 he was taken to the operating room where he underwent #1  pectoralis major muscle turnover flap, #2 excision of nonviable tissue 0.5 x 20 cm skin and fat.  #3 placement of ACell with VAC dressing.  He has remained stable following these procedures.  A JP drain has had gradually slowing amounts of drainage and it was discontinued on***.  The VAC dressing was removed on 10/12/2019 by plastic surgery and the wound appears to be doing quite well.  A Meraplex border dressing was placed which will be reevaluated on 10/17/2019 and pending findings by plastic surgery he is otherwise felt to be stable for discharge at that time.  It is notable that he is afebrile and vitals are quite stable.  Labs are also stable.  He does have a mild anemia with most recent hemoglobin and hematocrit of 10.3/32.3 respectively.  He has no leukocytosis.  Renal function is within normal limits.  Blood sugars have been under good control.  Oxygen has been weaned and he maintains good saturations on room air.  He is tolerating routine activities without significant difficulty.

## 2019-09-30 ENCOUNTER — Encounter (HOSPITAL_COMMUNITY): Payer: Self-pay | Admitting: Surgery

## 2019-09-30 ENCOUNTER — Inpatient Hospital Stay (HOSPITAL_COMMUNITY): Payer: Medicare Other

## 2019-09-30 ENCOUNTER — Other Ambulatory Visit: Payer: Self-pay

## 2019-09-30 ENCOUNTER — Encounter (HOSPITAL_COMMUNITY)
Admission: AD | Disposition: A | Discharge status: Home / Self Care | Payer: Self-pay | Source: Other Acute Inpatient Hospital | Attending: Surgery

## 2019-09-30 ENCOUNTER — Inpatient Hospital Stay (HOSPITAL_COMMUNITY): Payer: Medicare Other | Admitting: Certified Registered Nurse Anesthetist

## 2019-09-30 DIAGNOSIS — I9789 Other postprocedural complications and disorders of the circulatory system, not elsewhere classified: Secondary | ICD-10-CM

## 2019-09-30 HISTORY — PX: APPLICATION OF WOUND VAC: SHX5189

## 2019-09-30 HISTORY — PX: STERNAL WOUND DEBRIDEMENT: SHX1058

## 2019-09-30 LAB — GLUCOSE, CAPILLARY
Glucose-Capillary: 212 mg/dL — ABNORMAL HIGH (ref 70–99)
Glucose-Capillary: 135 mg/dL — ABNORMAL HIGH (ref 70–99)

## 2019-09-30 SURGERY — DEBRIDEMENT, WOUND, STERNUM
Anesthesia: General

## 2019-09-30 MED ORDER — FENTANYL CITRATE (PF) 250 MCG/5ML IJ SOLN
INTRAMUSCULAR | Status: AC
  Filled 2019-09-30: qty 5

## 2019-09-30 MED ORDER — OXYCODONE HCL 5 MG PO TABS
ORAL_TABLET | ORAL | Status: AC
  Filled 2019-09-30: qty 1

## 2019-09-30 MED ORDER — LIDOCAINE 2% (20 MG/ML) 5 ML SYRINGE
INTRAMUSCULAR | Status: DC | PRN
  Administered 2019-09-30: 60 mg via INTRAVENOUS

## 2019-09-30 MED ORDER — PROPOFOL 10 MG/ML IV BOLUS
INTRAVENOUS | Status: DC | PRN
  Administered 2019-09-30: 190 mg via INTRAVENOUS

## 2019-09-30 MED ORDER — LACTATED RINGERS IV SOLN: INTRAVENOUS | Status: DC

## 2019-09-30 MED ORDER — OXYCODONE HCL 5 MG/5ML PO SOLN: 5.0000 mg | Freq: Once | ORAL | Status: AC | PRN

## 2019-09-30 MED ORDER — OXYCODONE HCL 5 MG PO TABS
10.0000 mg | ORAL_TABLET | ORAL | Status: DC | PRN
  Administered 2019-10-03 – 2019-10-13 (×25): 10 mg via ORAL
  Filled 2019-09-30 (×25): qty 2

## 2019-09-30 MED ORDER — LIDOCAINE 2% (20 MG/ML) 5 ML SYRINGE
INTRAMUSCULAR | Status: AC
  Filled 2019-09-30: qty 5

## 2019-09-30 MED ORDER — MORPHINE SULFATE (PF) 2 MG/ML IV SOLN
2.0000 mg | INTRAVENOUS | Status: DC | PRN
  Administered 2019-10-03 – 2019-10-12 (×6): 2 mg via INTRAVENOUS
  Filled 2019-09-30 (×6): qty 1

## 2019-09-30 MED ORDER — ROCURONIUM BROMIDE 10 MG/ML (PF) SYRINGE
PREFILLED_SYRINGE | INTRAVENOUS | Status: AC
  Filled 2019-09-30: qty 10

## 2019-09-30 MED ORDER — ORAL CARE MOUTH RINSE: 15.0000 mL | Freq: Once | OROMUCOSAL | Status: AC

## 2019-09-30 MED ORDER — ONDANSETRON HCL 4 MG/2ML IJ SOLN
INTRAMUSCULAR | Status: DC | PRN
  Administered 2019-09-30: 4 mg via INTRAVENOUS

## 2019-09-30 MED ORDER — EPHEDRINE 5 MG/ML INJ
INTRAVENOUS | Status: AC
  Filled 2019-09-30: qty 10

## 2019-09-30 MED ORDER — PHENYLEPHRINE 40 MCG/ML (10ML) SYRINGE FOR IV PUSH (FOR BLOOD PRESSURE SUPPORT)
PREFILLED_SYRINGE | INTRAVENOUS | Status: AC
  Filled 2019-09-30: qty 10

## 2019-09-30 MED ORDER — PROPOFOL 10 MG/ML IV BOLUS
INTRAVENOUS | Status: AC
  Filled 2019-09-30: qty 20

## 2019-09-30 MED ORDER — CHLORHEXIDINE GLUCONATE 0.12 % MT SOLN
15.0000 mL | Freq: Once | OROMUCOSAL | Status: AC
  Administered 2019-09-30: 15 mL via OROMUCOSAL

## 2019-09-30 MED ORDER — ONDANSETRON HCL 4 MG/2ML IJ SOLN
INTRAMUSCULAR | Status: AC
  Filled 2019-09-30: qty 2

## 2019-09-30 MED ORDER — PHENYLEPHRINE 40 MCG/ML (10ML) SYRINGE FOR IV PUSH (FOR BLOOD PRESSURE SUPPORT)
PREFILLED_SYRINGE | INTRAVENOUS | Status: DC | PRN
  Administered 2019-09-30 (×2): 120 ug via INTRAVENOUS

## 2019-09-30 MED ORDER — INSULIN ASPART 100 UNIT/ML ~~LOC~~ SOLN
0.0000 [IU] | SUBCUTANEOUS | Status: DC
  Administered 2019-09-30: 5 [IU] via SUBCUTANEOUS
  Administered 2019-09-30: 2 [IU] via SUBCUTANEOUS
  Administered 2019-10-01 (×3): 3 [IU] via SUBCUTANEOUS
  Administered 2019-10-02 – 2019-10-04 (×3): 2 [IU] via SUBCUTANEOUS
  Administered 2019-10-04 – 2019-10-06 (×4): 3 [IU] via SUBCUTANEOUS
  Administered 2019-10-06: 2 [IU] via SUBCUTANEOUS
  Administered 2019-10-06: 3 [IU] via SUBCUTANEOUS
  Administered 2019-10-06 – 2019-10-12 (×7): 2 [IU] via SUBCUTANEOUS

## 2019-09-30 MED ORDER — MIDAZOLAM HCL 5 MG/5ML IJ SOLN
INTRAMUSCULAR | Status: DC | PRN
  Administered 2019-09-30 (×2): 1 mg via INTRAVENOUS

## 2019-09-30 MED ORDER — DEXAMETHASONE SODIUM PHOSPHATE 10 MG/ML IJ SOLN
INTRAMUSCULAR | Status: AC
  Filled 2019-09-30: qty 1

## 2019-09-30 MED ORDER — 0.9 % SODIUM CHLORIDE (POUR BTL) OPTIME
TOPICAL | Status: DC | PRN
  Administered 2019-09-30: 1000 mL

## 2019-09-30 MED ORDER — ONDANSETRON HCL 4 MG/2ML IJ SOLN: 4.0000 mg | Freq: Four times a day (QID) | INTRAMUSCULAR | Status: DC | PRN

## 2019-09-30 MED ORDER — OXYCODONE HCL 5 MG PO TABS
5.0000 mg | ORAL_TABLET | Freq: Once | ORAL | Status: AC | PRN
  Administered 2019-09-30: 5 mg via ORAL

## 2019-09-30 MED ORDER — FENTANYL CITRATE (PF) 250 MCG/5ML IJ SOLN
INTRAMUSCULAR | Status: DC | PRN
  Administered 2019-09-30: 100 ug via INTRAVENOUS

## 2019-09-30 MED ORDER — PHENYLEPHRINE HCL-NACL 10-0.9 MG/250ML-% IV SOLN
INTRAVENOUS | Status: DC | PRN
  Administered 2019-09-30: 35 ug/min via INTRAVENOUS

## 2019-09-30 MED ORDER — FENTANYL CITRATE (PF) 100 MCG/2ML IJ SOLN: 25.0000 ug | INTRAMUSCULAR | Status: DC | PRN

## 2019-09-30 MED ORDER — SUGAMMADEX SODIUM 200 MG/2ML IV SOLN
INTRAVENOUS | Status: DC | PRN
  Administered 2019-09-30 (×2): 100 mg via INTRAVENOUS

## 2019-09-30 MED ORDER — EPHEDRINE SULFATE-NACL 50-0.9 MG/10ML-% IV SOSY
PREFILLED_SYRINGE | INTRAVENOUS | Status: DC | PRN
  Administered 2019-09-30 (×4): 10 mg via INTRAVENOUS

## 2019-09-30 MED ORDER — DEXAMETHASONE SODIUM PHOSPHATE 10 MG/ML IJ SOLN
INTRAMUSCULAR | Status: DC | PRN
  Administered 2019-09-30: 5 mg via INTRAVENOUS

## 2019-09-30 MED ORDER — MIDAZOLAM HCL 2 MG/2ML IJ SOLN
INTRAMUSCULAR | Status: AC
  Filled 2019-09-30: qty 2

## 2019-09-30 MED ORDER — ROCURONIUM BROMIDE 10 MG/ML (PF) SYRINGE
PREFILLED_SYRINGE | INTRAVENOUS | Status: DC | PRN
  Administered 2019-09-30: 50 mg via INTRAVENOUS

## 2019-09-30 SURGICAL SUPPLY — 55 items
ATTRACTOMAT 16X20 MAGNETIC DRP (DRAPES) ×2 IMPLANT
BAG DECANTER FOR FLEXI CONT (MISCELLANEOUS) ×2 IMPLANT
BAND RUBBER #18 3X1/16 STRL (MISCELLANEOUS) IMPLANT
BENZOIN TINCTURE PRP APPL 2/3 (GAUZE/BANDAGES/DRESSINGS) ×4 IMPLANT
BLADE CLIPPER SURG (BLADE) ×2 IMPLANT
BLADE SURG 10 STRL SS (BLADE) ×4 IMPLANT
BNDG GAUZE ELAST 4 BULKY (GAUZE/BANDAGES/DRESSINGS) IMPLANT
CANISTER SUCT 3000ML PPV (MISCELLANEOUS) ×2 IMPLANT
CANISTER WOUNDNEG PRESSURE 500 (CANNISTER) ×2 IMPLANT
CATH THORACIC 28FR RT ANG (CATHETERS) IMPLANT
CATH THORACIC 36FR (CATHETERS) IMPLANT
CATH THORACIC 36FR RT ANG (CATHETERS) IMPLANT
CLIP VESOCCLUDE SM WIDE 24/CT (CLIP) IMPLANT
CNTNR URN SCR LID CUP LEK RST (MISCELLANEOUS) IMPLANT
CONT SPEC 4OZ STRL OR WHT (MISCELLANEOUS)
COVER SURGICAL LIGHT HANDLE (MISCELLANEOUS) ×4 IMPLANT
DRAPE LAPAROSCOPIC ABDOMINAL (DRAPES) ×2 IMPLANT
DRSG PAD ABDOMINAL 8X10 ST (GAUZE/BANDAGES/DRESSINGS) ×6 IMPLANT
DRSG VAC ATS MED SENSATRAC (GAUZE/BANDAGES/DRESSINGS) ×2 IMPLANT
ELECT REM PT RETURN 9FT ADLT (ELECTROSURGICAL) ×2
ELECTRODE REM PT RTRN 9FT ADLT (ELECTROSURGICAL) ×1 IMPLANT
GAUZE SPONGE 4X4 12PLY STRL (GAUZE/BANDAGES/DRESSINGS) ×2 IMPLANT
GAUZE XEROFORM 5X9 LF (GAUZE/BANDAGES/DRESSINGS) IMPLANT
GLOVE SS BIOGEL STRL SZ 7 (GLOVE) ×2 IMPLANT
GLOVE SUPERSENSE BIOGEL SZ 7 (GLOVE) ×2
GOWN STRL REUS W/ TWL LRG LVL3 (GOWN DISPOSABLE) ×4 IMPLANT
GOWN STRL REUS W/ TWL XL LVL3 (GOWN DISPOSABLE) ×1 IMPLANT
GOWN STRL REUS W/TWL LRG LVL3 (GOWN DISPOSABLE) ×4
GOWN STRL REUS W/TWL XL LVL3 (GOWN DISPOSABLE) ×1
HANDPIECE INTERPULSE COAX TIP (DISPOSABLE)
HEMOSTAT SURGICEL 2X14 (HEMOSTASIS) IMPLANT
KIT BASIN OR (CUSTOM PROCEDURE TRAY) ×2 IMPLANT
KIT SUCTION CATH 14FR (SUCTIONS) IMPLANT
KIT TURNOVER KIT B (KITS) ×2 IMPLANT
MARKER SKIN DUAL TIP RULER LAB (MISCELLANEOUS) IMPLANT
NS IRRIG 1000ML POUR BTL (IV SOLUTION) ×2 IMPLANT
PACK CHEST (CUSTOM PROCEDURE TRAY) ×2 IMPLANT
PAD ARMBOARD 7.5X6 YLW CONV (MISCELLANEOUS) ×4 IMPLANT
PIN SAFETY STERILE (MISCELLANEOUS) IMPLANT
SET HNDPC FAN SPRY TIP SCT (DISPOSABLE) IMPLANT
SPONGE LAP 18X18 RF (DISPOSABLE) ×2 IMPLANT
STAPLER VISISTAT 35W (STAPLE) IMPLANT
SUT ETHILON 3 0 FSL (SUTURE) IMPLANT
SUT STEEL 6MS V (SUTURE) IMPLANT
SUT STEEL STERNAL CCS#1 18IN (SUTURE) IMPLANT
SUT STEEL SZ 6 DBL 3X14 BALL (SUTURE) IMPLANT
SUT VIC AB 1 CTX 36 (SUTURE) ×2
SUT VIC AB 1 CTX36XBRD ANBCTR (SUTURE) ×2 IMPLANT
SWAB COLLECTION DEVICE MRSA (MISCELLANEOUS) ×2 IMPLANT
SWAB CULTURE ESWAB REG 1ML (MISCELLANEOUS) IMPLANT
SYR 5ML LL (SYRINGE) IMPLANT
TOWEL GREEN STERILE (TOWEL DISPOSABLE) ×2 IMPLANT
TOWEL GREEN STERILE FF (TOWEL DISPOSABLE) ×2 IMPLANT
TRAY FOLEY MTR SLVR 14FR STAT (SET/KITS/TRAYS/PACK) IMPLANT
WATER STERILE IRR 1000ML POUR (IV SOLUTION) ×2 IMPLANT

## 2019-09-30 NOTE — Anesthesia Preprocedure Evaluation (Signed)
Anesthesia Evaluation  Patient identified by MRN, date of birth, ID band Patient awake    Reviewed: Allergy & Precautions, H&P , NPO status , Patient's Chart, lab work & pertinent test results  Airway Mallampati: II   Neck ROM: full    Dental   Pulmonary Current Smoker,    breath sounds clear to auscultation       Cardiovascular hypertension, + angina + CAD, + Past MI and + CABG   Rhythm:regular Rate:Normal     Neuro/Psych PSYCHIATRIC DISORDERS Depression    GI/Hepatic   Endo/Other    Renal/GU      Musculoskeletal   Abdominal   Peds  Hematology  (+) Blood dyscrasia, anemia ,   Anesthesia Other Findings   Reproductive/Obstetrics                             Anesthesia Physical Anesthesia Plan  ASA: III  Anesthesia Plan: General   Post-op Pain Management:    Induction: Intravenous  PONV Risk Score and Plan: 1 and Ondansetron, Dexamethasone, Midazolam and Treatment may vary due to age or medical condition  Airway Management Planned: Oral ETT  Additional Equipment:   Intra-op Plan:   Post-operative Plan: Extubation in OR  Informed Consent: I have reviewed the patients History and Physical, chart, labs and discussed the procedure including the risks, benefits and alternatives for the proposed anesthesia with the patient or authorized representative who has indicated his/her understanding and acceptance.       Plan Discussed with: CRNA, Anesthesiologist and Surgeon  Anesthesia Plan Comments:         Anesthesia Quick Evaluation

## 2019-09-30 NOTE — Plan of Care (Signed)

## 2019-09-30 NOTE — Anesthesia Procedure Notes (Signed)
Procedure Name: Intubation Date/Time: 09/30/2019 2:44 PM Performed by: Trinna Post., CRNA Pre-anesthesia Checklist: Patient identified, Emergency Drugs available, Suction available, Patient being monitored and Timeout performed Patient Re-evaluated:Patient Re-evaluated prior to induction Oxygen Delivery Method: Circle system utilized Preoxygenation: Pre-oxygenation with 100% oxygen Induction Type: IV induction Ventilation: Mask ventilation without difficulty and Oral airway inserted - appropriate to patient size Laryngoscope Size: Mac and 4 Grade View: Grade I Tube type: Oral Tube size: 7.5 mm Number of attempts: 1 Airway Equipment and Method: Stylet Placement Confirmation: ETT inserted through vocal cords under direct vision,  positive ETCO2 and breath sounds checked- equal and bilateral Secured at: 22 cm Tube secured with: Tape Dental Injury: Teeth and Oropharynx as per pre-operative assessment

## 2019-09-30 NOTE — Progress Notes (Signed)
Patient arrived to OR alert and oriented. Patient able to confirm name, DOB, procedure, NDKA, no metal in body, npo status and no pain at this time. Patient able to move over to OR table with minimal assistance.   Markeesha Char, RN  

## 2019-09-30 NOTE — Op Note (Signed)
CARDIOVASCULAR SURGERY OPERATIVE NOTE  09/30/2019 Marcheta Grammes [id number]  Surgeon:  Alleen Borne, MD  First Assistant: none    Preoperative Diagnosis: Recurrent sternal wound dehiscence   Postoperative Diagnosis:  Same   Procedure:  Debridement of sternal wound and placement of wound VAC  Anesthesia:  General Endotracheal   Clinical History/Surgical Indication:  The patient is a 64 year old gentleman history of hypertension, hyperlipidemia, ongoing heavy smoking with COPD, alcohol abuse, ischemic cardiomyopathy and coronary disease who underwent coronary artery bypass graft surgery by me on 08/05/2019.  He had a slow postoperative course due to preoperative and postoperative atrial fibrillation ultimately requiring anticoagulation, heavy smoking with COPD and possible pneumonia on chest x-ray, and oxygen dependence.  He was ultimately discharged on postop day 11.  He continued to smoke 1 pack/day after discharge and was noncompliant with sternal precautions and returned to see me complaining of some right chest pain and was noted to have instability of the lower half of the sternum.  The incision was healing well without signs of infection.  He was taken back to the operating room on 09/20/2019 for sternal reconstruction.  At the time of surgery he was found to have most of the sternal wires pulling through the left side of the sternum.  The manubrium was slightly separated with the wires had not pulled through the manubrium.  All the sternal wires were removed and the sternum was reconstructed using longitudinal KLS Martin sternal plates followed by sternal wire placement around the plates.  The patient did well with that and wondered home the following day.  He has continued to smoke and been noncompliant with sternal precautions [place] that he was having some drainage from the lower portion of his chest incision and has noted some motion  in the sternum.  He was seen back in the office on Tuesday by our physician assistant and it was felt that he should be admitted for intravenous antibiotics for cellulitis and further evaluation of his sternum.  The patient refused admission and left the office.  Then yesterday he presented to the hospital in [facility] in the emergency room called our office and requested that the patient be transferred back to [facility].  He had a CT scan of the chest there which I reviewed and showed recurrent dehiscence of the sternum. This will require debridement under anesthesia in the operating room with wound VAC placement and will likely require reconstruction with a muscle flap I will ask plastic and reconstructive surgery to see him.  I discussed the operative procedure of debridement and wound VAC placement with him including alternatives, benefits, and risks including but not limited to bleeding, blood transfusion, possible need for recurrent debridement in the operating room and possible need for further surgery to reconstruct the sternum.  He understands agrees to proceed.  Preparation:  The patient was seen in the preoperative holding area and the correct patient, correct operation were confirmed with the patient after reviewing the medical record and catheterization. The consent was signed by me. Preoperative antibiotics were given. The patient was taken back to the operating room and positioned supine on the operating room table. After being placed under general endotracheal anesthesia by the anesthesia team the neck, chest, and abdomen were prepped with betadine soap and solution and draped in the usual sterile manner. A surgical time-out was taken and the correct patient and operative procedure were confirmed with the nursing and  anesthesia staff.   Sternal debridement:   There was only a small opening in the skin in the lower end of the incision with some serous drainage.  There is  minimal erythema.  The sternum was grossly unstable.  The incision was opened using a scalpel and the old suture material removed.  The fascial closure had dehisced along with the sternum.  The previously placed double sternal wires had pulled through the left side of the sternum and the left sternal plate had become completely separated from the sternum with the screws completely backed out.  The left side of the sternum was in multiple pieces.  The right sternal plate was still in place although multiple screws had also backed out.  All the sternal wires were removed about 3 were left in the manubrium for stability.  The sternal plates were divided just beyond the manubrium weightbearing small piece to reinforce the manubrial closure.  Rongeurs were used to debride the sternal edges and any nonviable tissue in the wound.  There was no pus and minimal fluid present.  A culture of the deep sternal wound was taken.  The wound was then irrigated with saline solution.  A medium VAC sponge was cut to the appropriate size and shape and placed in the wound.  This was covered with the occlusive dressing and the system was connected to 75 cm suction.  The sponge sucked down tightly and there not appear to be any system leaks.   All sponge, needle, and instrument counts were reported correct at the end of the case.  The patient was then transported to the postanesthesia care unit in stable condition.

## 2019-09-30 NOTE — Brief Op Note (Signed)
09/30/2019  4:00 PM  PATIENT:  Matthew [name]  64 y.o. male  PRE-OPERATIVE DIAGNOSIS:  STERNAL WOUND  POST-OPERATIVE DIAGNOSIS:  Sternal wound dihiscence  PROCEDURE:  Procedure(s): STERNAL WOUND DEBRIDEMENT (N/A) APPLICATION OF WOUND VAC (N/A)  SURGEON:  Surgeon(s) and Role:    * Rande Dario, Payton Doughty, MD - Primary  PHYSICIAN ASSISTANT: none  ASSISTANTS: none  ANESTHESIA:   general  EBL:  10 mL   BLOOD ADMINISTERED:none  DRAINS: wound VAC   LOCAL MEDICATIONS USED:  NONE  SPECIMEN:  Source of Specimen:  deep sternal wound culture  DISPOSITION OF SPECIMEN:  micro  COUNTS:  YES  TOURNIQUET:  * No tourniquets in log *  DICTATION: .Note written in EPIC  PLAN OF CARE: Admit to inpatient   PATIENT DISPOSITION:  PACU - hemodynamically stable.   Delay start of Pharmacological VTE agent (>24hrs) due to surgical blood loss or risk of bleeding: yes

## 2019-09-30 NOTE — Transfer of Care (Signed)
Immediate Anesthesia Transfer of Care Note  Patient: Matthew [name]  Procedure(s) Performed: STERNAL WOUND DEBRIDEMENT (N/A ) APPLICATION OF WOUND VAC (N/A )  Patient Location: PACU  Anesthesia Type:General  Level of Consciousness: awake, alert  and oriented  Airway & Oxygen Therapy: Patient Spontanous Breathing and Patient connected to nasal cannula oxygen  Post-op Assessment: Report given to RN and Post -op Vital signs reviewed and stable  Post vital signs: Reviewed and stable  Last Vitals:  Vitals Value Taken Time  BP 95/57 09/30/19 1553  Temp 36.6 C 09/30/19 1550  Pulse 59 09/30/19 1600  Resp 16 09/30/19 1600  SpO2 94 % 09/30/19 1600  Vitals shown include unvalidated device data.  Last Pain:  Vitals:   09/30/19 1550  TempSrc:   PainSc: 0-No pain      Patients Stated Pain Goal: 0 (09/29/19 2124)  Complications: No complications documented.

## 2019-09-30 NOTE — Progress Notes (Signed)
VAST consulted to obtain 2nd IV access for procedure.  Spoke with pt's nurse and advised after procedure completed, IV in [place] which was placed by EMT prior to admission needs to be removed per policy. Also asked that Shelby Dubin, RN speak with physicians about planned length of abx so we can place appropriate line when another access is needed. Shelby Dubin, RN verbalized understanding.

## 2019-10-01 LAB — CBC
HCT: 30.1 % — ABNORMAL LOW (ref 39.0–52.0)
Hemoglobin: 9.7 g/dL — ABNORMAL LOW (ref 13.0–17.0)
MCH: 29.7 pg (ref 26.0–34.0)
MCHC: 32.2 g/dL (ref 30.0–36.0)
MCV: 92 fL (ref 80.0–100.0)
Platelets: 445 10*3/uL — ABNORMAL HIGH (ref 150–400)
RBC: 3.27 MIL/uL — ABNORMAL LOW (ref 4.22–5.81)
RDW: 14.1 % (ref 11.5–15.5)
WBC: 8.8 10*3/uL (ref 4.0–10.5)
nRBC: 0 % (ref 0.0–0.2)

## 2019-10-01 LAB — GLUCOSE, CAPILLARY
Glucose-Capillary: 154 mg/dL — ABNORMAL HIGH (ref 70–99)
Glucose-Capillary: 166 mg/dL — ABNORMAL HIGH (ref 70–99)
Glucose-Capillary: 98 mg/dL (ref 70–99)
Glucose-Capillary: 100 mg/dL — ABNORMAL HIGH (ref 70–99)
Glucose-Capillary: 199 mg/dL — ABNORMAL HIGH (ref 70–99)

## 2019-10-01 LAB — BASIC METABOLIC PANEL
Anion gap: 10 (ref 5–15)
BUN: 9 mg/dL (ref 8–23)
CO2: 25 mmol/L (ref 22–32)
Calcium: 8.8 mg/dL — ABNORMAL LOW (ref 8.9–10.3)
Chloride: 99 mmol/L (ref 98–111)
Creatinine, Ser: 0.9 mg/dL (ref 0.61–1.24)
GFR calc Af Amer: >60 mL/min (ref >60)
GFR calc non Af Amer: >60 mL/min (ref >60)
Glucose, Bld: 142 mg/dL — ABNORMAL HIGH (ref 70–99)
Potassium: 4.2 mmol/L (ref 3.5–5.1)
Sodium: 134 mmol/L — ABNORMAL LOW (ref 135–145)

## 2019-10-01 LAB — TSH: TSH: 2.681 u[IU]/mL (ref 0.350–4.500)

## 2019-10-01 NOTE — Anesthesia Postprocedure Evaluation (Signed)
Anesthesia Post Note  Patient: Matthew [name]  Procedure(s) Performed: STERNAL WOUND DEBRIDEMENT (N/A ) APPLICATION OF WOUND VAC (N/A )     Patient location during evaluation: PACU Anesthesia Type: General Level of consciousness: awake and alert Pain management: pain level controlled Vital Signs Assessment: post-procedure vital signs reviewed and stable Respiratory status: spontaneous breathing, nonlabored ventilation, respiratory function stable and patient connected to nasal cannula oxygen Cardiovascular status: blood pressure returned to baseline and stable Postop Assessment: no apparent nausea or vomiting Anesthetic complications: no   No complications documented.  Last Vitals:  Vitals:   10/01/19 0641 10/01/19 0716  BP:  111/62  Pulse: 61 70  Resp: 16 15  Temp:  36.4 C  SpO2: 94% 97%    Last Pain:  Vitals:   10/01/19 0746  TempSrc:   PainSc: 0-No pain                 [name]

## 2019-10-01 NOTE — Plan of Care (Signed)

## 2019-10-01 NOTE — Progress Notes (Addendum)
      [address]       [place] [address]             [phone]        1 Day Post-Op Procedure(s) (LRB): STERNAL WOUND DEBRIDEMENT (N/A) APPLICATION OF WOUND VAC (N/A)  Subjective: Patient with some sternal pain/wound VAC  Objective: Vital signs in last 24 hours: Temp:  [97.5 F (36.4 C)-98.1 F (36.7 C)] 97.6 F (36.4 C) (07/10 0716) Pulse Rate:  [59-70] 70 (07/10 0716) Cardiac Rhythm: Normal sinus rhythm;Bundle branch block (07/10 0743) Resp:  [10-20] 15 (07/10 0716) BP: (93-119)/(55-76) 111/62 (07/10 0716) SpO2:  [91 %-97 %] 97 % (07/10 0716) Weight:  [88 kg] 88 kg (07/09 1319)   Current Weight  09/30/19 88 kg       Intake/Output from previous day: 07/09 0701 - 07/10 0700 In: 1240 [P.O.:240; I.V.:600; IV Piggyback:400] Out: 2585 [Urine:2425; Drains:150; Blood:10]   Physical Exam:  Cardiovascular: RRR Pulmonary: Clear to auscultation bilaterally Abdomen: Soft, non tender, bowel sounds present. Extremities: Trace bilateral lower extremity edema. Wounds: LLE wound is clean and dry.  No erythema or signs of infection. Wound VAC on sternum  Lab Results: CBC: Recent Labs    09/29/19 0039 10/01/19 0217  WBC 7.7 8.8  HGB 9.2* 9.7*  HCT 28.6* 30.1*  PLT 363 445*   BMET:  Recent Labs    09/29/19 0039 10/01/19 0217  NA 134* 134*  K 4.0 4.2  CL 101 99  CO2 25 25  GLUCOSE 109* 142*  BUN 9 9  CREATININE 0.96 0.90  CALCIUM 8.6* 8.8*    PT/INR:  Lab Results  Component Value Date   INR 1.2 09/29/2019   INR 1.1 09/20/2019   INR 1.5 (H) 09/16/2019   ABG:  INR: Will add last result for INR, ABG once components are confirmed Will add last 4 CBG results once components are confirmed  Assessment/Plan:  1. CV - SR. On Amiodarone 200 mg daily and Lopressor 12.5 mg bid. Will discuss with Dr. Cliffton Asters when restart Coumadin.  2.  Pulmonary - History of tobacco abuse/COPD. On 2 liters of oxygen via [place]. Wean as able.  3. Anemia-H  and H this am stable at 9.7 and 30.1 4.  ID-on Ceftazidime and Vancomycin. S/p debridement of sternal wound and placement of wound VAC. Wound VAC with 150 cc of drainage. Likely change wound VAC Monday. Of note, he had sternum reconstructed on 06/129 with KLS Martin sternal plates and sternal wires around plates. 5. CBGs 135/154/166. Pre op HGA1C 5.7. He has some degree of Insulin resistance. Will provide nutrition information for "pre diabetes" with discharge paperwork. Will need follow up with medical doctor after discharge. 6. Mild volume overload-on Lasix 40 mg daily. 7. Wet to dry dressing LLE  Donielle M ZimmermanPA-C 10/01/2019,9:55 AM  Agree with above Continue wound care Plastics consulted for wound coverage  Damarri Rampy O Daronte Shostak

## 2019-10-02 ENCOUNTER — Encounter (HOSPITAL_COMMUNITY): Payer: Self-pay | Admitting: Surgery

## 2019-10-02 LAB — GLUCOSE, CAPILLARY
Glucose-Capillary: 102 mg/dL — ABNORMAL HIGH (ref 70–99)
Glucose-Capillary: 114 mg/dL — ABNORMAL HIGH (ref 70–99)
Glucose-Capillary: 118 mg/dL — ABNORMAL HIGH (ref 70–99)
Glucose-Capillary: 119 mg/dL — ABNORMAL HIGH (ref 70–99)
Glucose-Capillary: 135 mg/dL — ABNORMAL HIGH (ref 70–99)
Glucose-Capillary: 89 mg/dL (ref 70–99)
Glucose-Capillary: 95 mg/dL (ref 70–99)

## 2019-10-02 NOTE — Progress Notes (Addendum)
      [address]       [place] [address]             [phone]        2 Days Post-Op Procedure(s) (LRB): STERNAL WOUND DEBRIDEMENT (N/A) APPLICATION OF WOUND VAC (N/A)  Subjective: Patient sleeping and awakened. He has no specific complaint this am.  Objective: Vital signs in last 24 hours: Temp:  [97.6 F (36.4 C)-98.2 F (36.8 C)] 97.7 F (36.5 C) (07/11 0723) Pulse Rate:  [60-70] 60 (07/11 0723) Cardiac Rhythm: Normal sinus rhythm (07/11 0723) Resp:  [12-18] 18 (07/11 0723) BP: (107-126)/(57-71) 126/71 (07/11 0723) SpO2:  [94 %-98 %] 95 % (07/11 0723)   Current Weight  09/30/19 88 kg       Intake/Output from previous day: 07/10 0701 - 07/11 0700 In: 1080 [P.O.:1080] Out: 5425 [Urine:5425]   Physical Exam:  Cardiovascular: RRR Pulmonary: Clear to auscultation bilaterally Abdomen: Soft, non tender, bowel sounds present. Extremities: Trace bilateral lower extremity edema. Wounds: LLE wound is clean and dry.  No erythema or signs of infection. Wound VAC on sternum  Lab Results: CBC: Recent Labs    10/01/19 0217  WBC 8.8  HGB 9.7*  HCT 30.1*  PLT 445*   BMET:  Recent Labs    10/01/19 0217  NA 134*  K 4.2  CL 99  CO2 25  GLUCOSE 142*  BUN 9  CREATININE 0.90  CALCIUM 8.8*    PT/INR:  Lab Results  Component Value Date   INR 1.2 09/29/2019   INR 1.1 09/20/2019   INR 1.5 (H) 09/16/2019   ABG:  INR: Will add last result for INR, ABG once components are confirmed Will add last 4 CBG results once components are confirmed  Assessment/Plan:  1. CV - SR, SB with HR in the 50's at times. On Amiodarone 200 mg daily. Will stop Lopressor as labile BP and at times bradycardia. 2.  Pulmonary - History of tobacco abuse/COPD. On 2 liters of oxygen via [place]. Wean as able.  3. Anemia-H and H this am stable at 9.7 and 30.1 4.  ID-on Ceftazidime and Vancomycin. S/p debridement of sternal wound and placement of wound VAC.  Likely  change wound VAC Monday. Of note, he had sternum reconstructed on 06/129 with KLS Martin sternal plates and sternal wires around plates. Will likely need plastic surgery consult for possible muscle flap 5. CBGs 118/114/135. Pre op HGA1C 5.7. He has some degree of Insulin resistance. Will provide nutrition information for "pre diabetes" with discharge paperwork. Will need follow up with medical doctor after discharge. 6. Mild volume overload-on Lasix 40 mg daily. Will hold for now and monitor as BP more labile 7. Wet to dry dressing LLE  Donielle M ZimmermanPA-C 10/02/2019,10:02 AM  Agree with above No events Wound VAC in place Awaiting plastics consult  Aking Klabunde O Mirtha Jain

## 2019-10-03 LAB — GLUCOSE, CAPILLARY
Glucose-Capillary: 105 mg/dL — ABNORMAL HIGH (ref 70–99)
Glucose-Capillary: 122 mg/dL — ABNORMAL HIGH (ref 70–99)
Glucose-Capillary: 97 mg/dL (ref 70–99)
Glucose-Capillary: 110 mg/dL — ABNORMAL HIGH (ref 70–99)
Glucose-Capillary: 112 mg/dL — ABNORMAL HIGH (ref 70–99)

## 2019-10-03 LAB — VANCOMYCIN, TROUGH: Vancomycin Tr: 21 ug/mL (ref 15–20)

## 2019-10-03 MED ORDER — VANCOMYCIN HCL 750 MG/150ML IV SOLN
750.0000 mg | Freq: Two times a day (BID) | INTRAVENOUS | Status: AC
  Administered 2019-10-04 – 2019-10-11 (×16): 750 mg via INTRAVENOUS
  Filled 2019-10-03 (×17): qty 150

## 2019-10-03 MED FILL — Fentanyl Citrate Preservative Free (PF) Inj 100 MCG/2ML: INTRAMUSCULAR | Qty: 2 | Status: AC

## 2019-10-03 NOTE — Plan of Care (Signed)

## 2019-10-03 NOTE — Progress Notes (Addendum)
      [address]       [place] [address]             [phone]      3 Days Post-Op Procedure(s) (LRB): STERNAL WOUND DEBRIDEMENT (N/A) APPLICATION OF WOUND VAC (N/A) Subjective: Feels okay this morning. Asking about when he can go home.   Objective: Vital signs in last 24 hours: Temp:  [97.6 F (36.4 C)-98.2 F (36.8 C)] 97.6 F (36.4 C) (07/12 0717) Pulse Rate:  [60-64] 64 (07/12 0419) Cardiac Rhythm: Normal sinus rhythm (07/12 0730) Resp:  [15-20] 16 (07/12 0717) BP: (123-143)/(67-77) 127/67 (07/12 0419) SpO2:  [95 %-96 %] 96 % (07/12 0419)      Intake/Output from previous day: 07/11 0701 - 07/12 0700 In: 2660 [P.O.:1060; IV Piggyback:1600] Out: 3100 [Urine:3100] Intake/Output this shift: Total I/O In: 240 [P.O.:240] Out: -   General appearance: alert, cooperative and no distress Heart: regular rate and rhythm, S1, S2 normal, no murmur, click, rub or gallop Lungs: clear to auscultation bilaterally Abdomen: soft, non-tender; bowel sounds normal; no masses,  no organomegaly Extremities: extremities normal, atraumatic, no cyanosis or edema Wound: good seal on wound vac. left EVH site healing well  Lab Results: Recent Labs    10/01/19 0217  WBC 8.8  HGB 9.7*  HCT 30.1*  PLT 445*   BMET:  Recent Labs    10/01/19 0217  NA 134*  K 4.2  CL 99  CO2 25  GLUCOSE 142*  BUN 9  CREATININE 0.90  CALCIUM 8.8*    PT/INR: No results for input(s): LABPROT, INR in the last 72 hours. ABG    Component Value Date/Time   PHART 7.409 09/16/2019 1059   HCO3 22.2 09/16/2019 1059   TCO2 27 08/05/2019 1859   ACIDBASEDEF 1.8 09/16/2019 1059   O2SAT 98.1 09/16/2019 1059   CBG (last 3)  Recent Labs    10/02/19 1959 10/02/19 2333 10/03/19 0414  GLUCAP 119* 95 105*    Assessment/Plan: S/P Procedure(s) (LRB): STERNAL WOUND DEBRIDEMENT (N/A) APPLICATION OF WOUND VAC (N/A)  1. CV-Remains in NSR in the 60s. BP well controlled. Continue  Amiodarone 200mg  daily, Lopressor 12.5mg  BID, asa and statin. 2. Pulm-Continue to wean oxygen as able.  3. Wound vac in place with good suction. Due to be changed today 4. ID-on Ceftazidime and Vancomycin 5. CBGs with good control on current regimen 6. Continue wet-to-dry dressing daily on LLE EVH site  Plan: Wound vac change planned for today. Patient is asking to be pre-medicated. Continue IV antibiotic therapy. Awaiting Plastic surgery consult today.    LOS: 5 days    10/03/2019   Chart reviewed, patient examined, agree with above. Operative cultures grew rare Staph epi sens to vanc but resistant to oxacillin. I think we can DC 12/04/2019. There was no sign of active infection at the time of debridement.

## 2019-10-03 NOTE — Progress Notes (Signed)
Pharmacy Antibiotic Note  Ildefonso Keaney is a 64 y.o. male admitted on 09/28/2019 with wound infection.  Pharmacy has been consulted for vancomycin and Fortaz dosing. WBC wnl, afebrile, Cr 0.9 crcl 75ml/min VT 21 slightly > goal, drawn correctly  Plan: - Fortaz - dc - as wound cx staph epi sensitive to vacnomycin - decrease vancomycin 750mg  IV q12h   - Will f/u renal function for dose adjustments as necessary  Height: 5\' 6"  (167.6 cm) Weight: 88 kg (194 lb) IBW/kg (Calculated) : 63.8  Temp (24hrs), Avg:97.9 F (36.6 C), Min:97.6 F (36.4 C), Max:98.2 F (36.8 C)  Recent Labs  Lab 09/29/19 0039 10/01/19 0217 10/03/19 1245  WBC 7.7 8.8  --   CREATININE 0.96 0.90  --   VANCOTROUGH  --   --  21*    Estimated Creatinine Clearance: 87.3 mL/min (by C-G formula based on SCr of 0.9 mg/dL).    No Known Allergies  Antimicrobials this admission: Vanc 7/7>>> 12/02/19 7/8>>> 7/12   Microbiology results: 7/8&9 wound Cx staph epi S vanc  7/7 MRSA PCR: NEG   9/12 Pharm.D. CPP, BCPS Clinical Pharmacist [phone] 10/03/2019 2:35 PM

## 2019-10-03 NOTE — Discharge Summary (Signed)
Physician Discharge Summary  Patient ID: Matthew [name] MRN: [id number] DOB/AGE: Jan 27, 1956 64 y.o.  Admit date: 09/28/2019 Discharge date: 10/17/2019  Admission Diagnoses: Patient Active Problem List   Diagnosis Date Noted  . Sternal wound infection 09/28/2019  . Sternal pain 09/20/2019  . 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 Long term (current) use of anticoagulants 08/19/2019  . Paroxysmal atrial fibrillation (HCC)   . 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 Coronary artery disease 08/05/2019  . CAD S/P percutaneous coronary angioplasty of 100% OM1 08/04/2019  . Ischemic cardiomyopathy 08/04/2019  . Essential hypertension 08/04/2019  . History of stroke 08/04/2019  . Heavy smoker 1 PPD since age 62 08/04/2019  . Hyperlipidemia with target LDL less than 70 08/04/2019  . NSTEMI (non-ST elevated myocardial infarction) (HCC) 08/03/2019  . Multivessel coronary artery disease involving native coronary artery of native heart with unstable angina pectoris (HCC) 08/03/2019    Discharge Diagnoses:  Active Problems:   Sternal wound infection with recurrent dehiscence   Discharged Condition: good  HPI:    The patient is a 64 year old gentleman history of hypertension, hyperlipidemia, ongoing heavy smoking with COPD, alcohol abuse, ischemic cardiomyopathy and coronary disease who underwent coronary artery bypass graft surgery by Dr. Laneta [name] on 08/05/2019.  He had a slow postoperative course due to preoperative and postoperative atrial fibrillation ultimately requiring anticoagulation, heavy smoking with COPD and possible pneumonia on chest x-ray, and oxygen dependence.  He was ultimately discharged on postop day 11.  He continued to smoke 1 pack/day after discharge and was noncompliant with sternal precautions and returned to see me complaining of some right chest pain and was noted to have instability of the lower half of the sternum.  The incision was healing well without signs of  infection.  He was taken back to the operating room on 09/20/2019 for sternal reconstruction.  At the time of surgery he was found to have most of the sternal wires pulling through the left side of the sternum.  The manubrium was slightly separated with the wires had not pulled through the manubrium.  All the sternal wires were removed and the sternum was reconstructed using longitudinal KLS Martin sternal plates followed by sternal wire placement around the plates.  The patient did well with that and wondered home the following day.  He has continued to smoke and been noncompliant with sternal precautions [place] that he was having some drainage from the lower portion of his chest incision and has noted some motion in the sternum.  He was seen back in the office on Tuesday by our physician assistant and it was felt that he should be admitted for intravenous antibiotics for cellulitis and further evaluation of his sternum.  The patient refused admission and left the office.  Then yesterday he presented to the hospital in [facility] in the emergency room called our office and requested that the patient be transferred back to [facility].  He had a CT scan of the chest there which Dr. Laneta [name] reviewed and showed recurrent dehiscence of the sternum.  He was transferred to our service by [facility].  Hospital Course:   On 09/30/2019 Matthew [name] underwent a sternal debridement and placement of a wound vac with Dr. Laneta [name] for recurrent sternal wound dehiscence. POD 1 he continued to progress. The patient had a history of tobacco abuse/COPD. We continued to wean oxygen as tolerated. He will need follow-up with a  medical doctor after discharge. We continued IV Ceftazidime and Vancomycin. We consulted plastic surgery for possible muscle flap. He continued to have mild volume overload therefore, we continued Lasix 40mg  daily. We emphaized that his wound vac needs to  be changed Monday, Wednesday, and Friday and that his LLE EVH site needs to be changed daily wet-to-dry. Overall, he has been doing well. On 10/05/2019, Matthew [name] underwent a pectoralis major muscle flap, excision of nonviable tissue, placement of Acell and VAC dressing. POD 1 He was doing well. His pain was well controlled. He remained on Vancomycin for a total of 7 days antibiotic therapy. His chest tube drainage remained low. His VAC dressing had a good seal and suction. On 7/14 Matthew [name] underwent a debridement of sternal wound, right pectoralis turnover flap, placement of wound matrix, and primary closure and placement of incisional wound VAC with Dr. Broadus [name]. He tolerated the procedure well and was transferred back to his room on the progression floor. He was weaned from supplemental oxygen. A drain was placed during surgery to a suction bulb and continued to drain. Once the drainage slowed down, it was removed on 10/17/19 by the plastic surgery service. His social history continued to contribute to a difficult discharge plan as the patient is homeless and is trying to get his sister's help with a place to stay.   Today, he is ambulating with limited assistance, tolerating room air, his incision is healing well, and he is ready for discharge. He has maintained sinus rhythm and will not be re-started on Coumadin.   Consults: None  Significant Diagnostic Studies:   CLINICAL DATA:  Disruption of closure of sternotomy.  EXAM: PORTABLE CHEST 1 VIEW  COMPARISON:  September 27, 2019  FINDINGS: Three sternal wires are seen. The additional sternal wire seen on the prior study have been removed. Very mild atelectasis is seen within the bilateral lung bases. There is no evidence of acute infiltrate, pleural effusion or pneumothorax. The cardiac silhouette is mildly enlarged and stable in size. Radiopaque fixation plate and screws are seen overlying the cervical spine.  IMPRESSION: 1. Interval  revision of the previously noted median sternotomy. 2. Very mild bibasilar atelectasis. 3. No acute cardiopulmonary disease.   Electronically Signed   By: September 29, 2019 M.D.   On: 09/30/2019 19:37  Treatments:  CARDIOVASCULAR SURGERY OPERATIVE NOTE  09/30/2019 12/01/2019 Marcheta Grammes  Surgeon:  [id number], MD  First Assistant: none    Preoperative Diagnosis: Recurrent sternal wound dehiscence   Postoperative Diagnosis:  Same   Procedure:  Debridement of sternal wound and placement of wound VAC  Anesthesia:  General Endotracheal   Discharge Exam: Blood pressure (!) 115/61, pulse 55, temperature 98.8 F (37.1 C), temperature source Oral, resp. rate 16, height 5\' 6"  (1.676 m), weight 78.4 kg, SpO2 96 %.   General appearance: alert, cooperative and no distress Neurologic: intact Heart: regular rate and rhythm Lungs: Breath sounds are clear, no dyspnea on RA.  Abdomen: soft, NT.  Wound: A dry dressing remains in place over the sterrnal incision.  The [place] drain has been removed.   Disposition:  Matthew [name] is discharged to home with a family member   Allergies as of 10/17/2019   No Known Allergies     Medication List    STOP taking these medications   nicotine 21 mg/24hr patch Commonly known as: NICODERM CQ - dosed in mg/24 hours Replaced by: nicotine 14 mg/24hr patch   oxycodone 5 MG capsule Commonly known  as: OXY-IR   Potassium Chloride ER 20 MEQ Tbcr   warfarin 5 MG tablet Commonly known as: COUMADIN     TAKE these medications   acetaminophen 325 MG tablet Commonly known as: TYLENOL Take 2 tablets (650 mg total) by mouth every 6 (six) hours as needed for mild pain.   aspirin 81 MG EC tablet Take 1 tablet (81 mg total) by mouth daily. Swallow whole. Start taking on: October 18, 2019   atorvastatin 40 MG tablet Commonly known as: LIPITOR Take 1 tablet (40 mg total) by mouth daily. Start taking on: October 18, 2019    levothyroxine 100 MCG tablet Commonly known as: SYNTHROID Take 1 tablet (100 mcg total) by mouth daily at 6 (six) AM.   metoprolol tartrate 25 MG tablet Commonly known as: LOPRESSOR Take 12.5 mg by mouth 2 (two) times daily.   nicotine 14 mg/24hr patch Commonly known as: NICODERM CQ - dosed in mg/24 hours Place 1 patch (14 mg total) onto the skin daily. Start taking on: October 18, 2019 Replaces: nicotine 21 mg/24hr patch   traMADol 50 MG tablet Commonly known as: ULTRAM Take 1 tablet (50 mg total) by mouth every 6 (six) hours as needed for up to 5 days (mild pain).          Follow-up Information    Alleen Borne, MD Follow up.   Specialty: Cardiothoracic Surgery Why: Your routine follow-up appointment is on 10/26/2019 at 1:30pm. Please arrive at 1:00pm for a chest xray located at [facility] which is on the first floor of our building.  Contact information: [address] [place] [address] [phone]        Juel Burrow, NP. Call in 1 day(s).   Contact information: [address] [place] [address] [phone]        Revankar, Aundra Dubin, MD .   Specialty: Cardiology Contact information: [address] [place]  [place] [address] [phone]        Peggye Form, DO. Call in 1 day(s).   Specialty: Plastic Surgery Contact information: [address] [place] [address] [place] [address] [phone]               Signed: Leary Roca, PA-C 10/17/2019, 11:36 AM

## 2019-10-03 NOTE — H&P (View-Only) (Signed)
[facility]  Reason for Consult: Sternal wound Referring Physician: Dr. Maia Breslow Krotzer is an 64 y.o. male.  HPI: Patient is a 64 year old male with a history of hypertension, hyperlipidemia, alcohol abuse, tobacco abuse with COPD, ischemic cardiomyopathy and coronary artery disease.  Patient underwent CABG on 08/05/2019.  Patient returned back to the operating room on 09/20/2019 for sternal reconstruction after CT surgery noticed instability of lower half of sternum.  Of note patient continues to smoke 1 pack/day, he reports he is trying to quit and is currently on Chantix.  Patient underwent KLS sternal plating on 09/20/2019.  Patient was transferred to [facility] after being seen in the ED in [facility].  Patient underwent sternal wound debridement and application of wound VAC on 09/30/2019 by Dr. Laneta Simmers.  Plastic surgery consulted for evaluation of sternal wound.  Patient's left internal mammary artery was used for LAD grafting.  Patient denies any pain today, doing well.  Denies any fevers, chills, nausea, 9.  Reports he has been smoking most of his life, starting at the age of 2 and was a 4 pack/day smoker until recently and he is down to 1 pack/day.  Currently on Chantix.  Past Medical History:  Diagnosis Date  . Burn erythema of lower leg   . Depression   . Dyspnea   . Essential hypertension 08/04/2019  . Heavy smoker 1 PPD since age 70 08/04/2019  . History of stroke 08/04/2019  . Hyperlipidemia with target LDL less than 70 08/04/2019  . Hypertension   . Ischemic cardiomyopathy 08/03/2019   Echo: EF 35-40% with moderately decreased function.  Global HK.  GR 1 DD.  Elevated LAP.  Mildly elevated PA P estimated 39 mmHg.  Mild LA dilation.  Normal CVP/RAP  . Multivessel coronary artery disease involving native coronary artery of native heart with unstable angina pectoris (HCC) 08/03/2019   Cardiac Cath-PTCA: Significant 3V CAD.  Culprit lesion- 100%  mOM1 (PTCA only - 25%), ostial and prox LAD 90%-ostD1 80% (bifurcation), mid to distal RCA 60%.  EF 35-40%.  Moderately elevated LVEDP. -->  PTCA only performed to avoid DAPT requirement (no P2 Y 12 inhibitor).  CVTS consultation for CABG.  . NSTEMI (non-ST elevated myocardial infarction) (HCC) 08/03/2019  . Tobacco use     Past Surgical History:  Procedure Laterality Date  . APPLICATION OF WOUND VAC N/A 09/30/2019   Procedure: APPLICATION OF WOUND VAC;  Surgeon: Alleen Borne, MD;  Location: [facility] OR;  Service: Thoracic;  Laterality: N/A;  . BACK SURGERY    . CARPAL TUNNEL RELEASE    . COLON SURGERY    . CORONARY ANGIOPLASTY  08/03/2019   Dr. Romilda Joy: PTCA only 100% OM1 in setting of non-STEMI; plan for multivessel CABG  . CORONARY ARTERY BYPASS GRAFT N/A 08/05/2019   Procedure: CORONARY ARTERY BYPASS GRAFTING (CABG), ON PUMP, TIMES THREE, USING LEFT INTERNAL MAMMARY ARTERY AND ENDOSCOPICALLY HARVESTED LEFT GREATER SAPHENOUS VEIN;  Surgeon: Alleen Borne, MD;  Location: [facility] OR;  Service: Open Heart Surgery;  Laterality: N/A;  LIMA to LAD, SVG to DIAGONAL 1, SVG to OM1  . CORONARY BALLOON ANGIOPLASTY N/A 08/03/2019   Procedure: CORONARY BALLOON ANGIOPLASTY;  Surgeon: Iran Ouch, MD;  Location: [facility] INVASIVE CV LAB;  Service: Cardiovascular;  Laterality: N/A;  . JOINT REPLACEMENT    . LEFT HEART CATH AND CORONARY ANGIOGRAPHY  08/03/2019   Dr. Kirke Corin: Significant 3V CAD.  Culprit lesion- 100% mOM1 (PTCA only - 25%),  ostial and prox LAD 90%-ostD1 80% (bifurcation), mid to distal RCA 60%.  EF 35-40%.  Moderately elevated LVEDP. -->  PTCA only performed to avoid DAPT requirement (no P2 Y 12 inhibitor).  CVTS consultation for CABG.   . LEFT HEART CATH AND CORONARY ANGIOGRAPHY N/A 08/03/2019   Procedure: LEFT HEART CATH AND CORONARY ANGIOGRAPHY;  Surgeon: Iran Ouch, MD;  Location: [facility] INVASIVE CV LAB;  Service: Cardiovascular;  Laterality: N/A;  . REPLACEMENT TOTAL KNEE    . RIB PLATING  N/A 09/20/2019   Procedure: RIB PLATING WITH KLS MARTIN STERNAL PLATE;  Surgeon: Alleen Borne, MD;  Location: [facility] OR;  Service: Thoracic;  Laterality: N/A;  . STERNAL INCISION RECLOSURE N/A 09/20/2019   Procedure: STERNAL RECONSTRUCTION AND STERNAL REWIRING;  Surgeon: Alleen Borne, MD;  Location: [facility] OR;  Service: Thoracic;  Laterality: N/A;  . STERNAL WOUND DEBRIDEMENT N/A 09/30/2019   Procedure: STERNAL WOUND DEBRIDEMENT;  Surgeon: Alleen Borne, MD;  Location: [facility] OR;  Service: Thoracic;  Laterality: N/A;  . TEE WITHOUT CARDIOVERSION N/A 08/05/2019   Procedure: TRANSESOPHAGEAL ECHOCARDIOGRAM (TEE);  Surgeon: Alleen Borne, MD;  Location: [facility] OR;  Service: Open Heart Surgery;  Laterality: N/A;  . TRANSTHORACIC ECHOCARDIOGRAM  08/03/2019    EF 35-40% with moderately decreased function.  Global HK.  GR 1 DD.  Elevated LAP.  Mildly elevated PA P estimated 39 mmHg.  Mild LA dilation.  Normal CVP/RAP    Family History  Problem Relation Age of Onset  . Diabetes Mother   . Cancer Father     Social History:  reports that he has been smoking cigarettes. He has a 46.00 pack-year smoking history. He has never used smokeless tobacco. He reports current alcohol use of about 2.0 standard drinks of alcohol per week. He reports that he does not use drugs.  Allergies: No Known Allergies  Medications: I have reviewed the patient's current medications.  Results for orders placed or performed during the hospital encounter of 09/28/19 (from the past 48 hour(s))  Glucose, capillary     Status: Abnormal   Collection Time: 10/01/19  4:45 PM  Result Value Ref Range   Glucose-Capillary 100 (H) 70 - 99 mg/dL    Comment: Glucose reference range applies only to samples taken after fasting for at least 8 hours.   Comment 1 Notify RN    Comment 2 Document in Chart   Glucose, capillary     Status: Abnormal   Collection Time: 10/01/19  8:15 PM  Result Value Ref Range   Glucose-Capillary 199 (H) 70 - 99 mg/dL     Comment: Glucose reference range applies only to samples taken after fasting for at least 8 hours.  Glucose, capillary     Status: Abnormal   Collection Time: 10/02/19 12:48 AM  Result Value Ref Range   Glucose-Capillary 118 (H) 70 - 99 mg/dL    Comment: Glucose reference range applies only to samples taken after fasting for at least 8 hours.  Glucose, capillary     Status: Abnormal   Collection Time: 10/02/19  4:23 AM  Result Value Ref Range   Glucose-Capillary 114 (H) 70 - 99 mg/dL    Comment: Glucose reference range applies only to samples taken after fasting for at least 8 hours.  Glucose, capillary     Status: Abnormal   Collection Time: 10/02/19  7:37 AM  Result Value Ref Range   Glucose-Capillary 135 (H) 70 - 99 mg/dL    Comment: Glucose reference  range applies only to samples taken after fasting for at least 8 hours.   Comment 1 Notify RN    Comment 2 Document in Chart   Glucose, capillary     Status: None   Collection Time: 10/02/19 11:30 AM  Result Value Ref Range   Glucose-Capillary 89 70 - 99 mg/dL    Comment: Glucose reference range applies only to samples taken after fasting for at least 8 hours.   Comment 1 Notify RN    Comment 2 Document in Chart   Glucose, capillary     Status: Abnormal   Collection Time: 10/02/19  4:09 PM  Result Value Ref Range   Glucose-Capillary 102 (H) 70 - 99 mg/dL    Comment: Glucose reference range applies only to samples taken after fasting for at least 8 hours.   Comment 1 Notify RN    Comment 2 Document in Chart   Glucose, capillary     Status: Abnormal   Collection Time: 10/02/19  7:59 PM  Result Value Ref Range   Glucose-Capillary 119 (H) 70 - 99 mg/dL    Comment: Glucose reference range applies only to samples taken after fasting for at least 8 hours.  Glucose, capillary     Status: None   Collection Time: 10/02/19 11:33 PM  Result Value Ref Range   Glucose-Capillary 95 70 - 99 mg/dL    Comment: Glucose reference range applies  only to samples taken after fasting for at least 8 hours.  Glucose, capillary     Status: Abnormal   Collection Time: 10/03/19  4:14 AM  Result Value Ref Range   Glucose-Capillary 105 (H) 70 - 99 mg/dL    Comment: Glucose reference range applies only to samples taken after fasting for at least 8 hours.  Glucose, capillary     Status: Abnormal   Collection Time: 10/03/19  7:54 AM  Result Value Ref Range   Glucose-Capillary 122 (H) 70 - 99 mg/dL    Comment: Glucose reference range applies only to samples taken after fasting for at least 8 hours.   Comment 1 Notify RN    Comment 2 Document in Chart   Glucose, capillary     Status: None   Collection Time: 10/03/19 11:30 AM  Result Value Ref Range   Glucose-Capillary 97 70 - 99 mg/dL    Comment: Glucose reference range applies only to samples taken after fasting for at least 8 hours.  Vancomycin, trough     Status: Abnormal   Collection Time: 10/03/19 12:45 PM  Result Value Ref Range   Vancomycin Tr 21 (HH) 15 - 20 ug/mL    Comment: CRITICAL RESULT CALLED TO, READ BACK BY AND VERIFIED WITH: SUZANNE SETH,RN AT 1358 10/03/2019 BY ZBEECH. Performed at [facility], [address], [place], [place] [address]     No results found.  Review of Systems  Constitutional: Negative for chills and fever.  Cardiovascular: Negative.   Gastrointestinal: Negative.    Blood pressure 138/78, pulse 63, temperature 97.8 F (36.6 C), temperature source Oral, resp. rate 14, height 5\' 6"  (1.676 m), weight 88 kg, SpO2 92 %. Physical Exam Constitutional:      General: He is not in acute distress.    Appearance: He is not ill-appearing.  HENT:     Head: Normocephalic and atraumatic.  Cardiovascular:     Pulses: Normal pulses.     Comments: Sternal wound with exposed bone/cartilage, granulation tissue noted, sternal wires and plating noted superiorly.  Wound is 17 x 5 x 4 cm.  No periwound cellulitis noted.  No foul odor.  No purulence.  Minimal  tenderness to palpation of the periwound area.  Serosanguineous drainage in wound VAC canister, approximately 350 cc. Chest:    Musculoskeletal:     Right lower leg: No edema.     Left lower leg: No edema.  Neurological:     General: No focal deficit present.     Mental Status: He is alert and oriented to person, place, and time.  Psychiatric:        Mood and Affect: Mood normal.        Behavior: Behavior normal.     Assessment/Plan:  Plan for debridement of sternal wound, right pectoralis turnover flap, possible placement of wound matrix, possible primary closure and placement of incisional wound VAC on 10/05/2019 with Dr. Dillingham.  Case has been discussed with CT surgery who will be present.  Appreciate their assistance.  Recommend protein shakes (Ensure) for optimizing nutritional status.  Patient currently on ceftazidime and vancomycin.  No current anticoagulation.  Pictures were obtained of the patient and placed in the chart with the patient's or guardian's permission.  Holliday Sheaffer J Viliami Bracco, PA-C 10/03/2019, 3:49 PM      

## 2019-10-03 NOTE — Consult Note (Signed)
[facility]  Reason for Consult: Sternal wound Referring Physician: Dr. Maia Breslow Krotzer is an 64 y.o. male.  HPI: Patient is a 64 year old male with a history of hypertension, hyperlipidemia, alcohol abuse, tobacco abuse with COPD, ischemic cardiomyopathy and coronary artery disease.  Patient underwent CABG on 08/05/2019.  Patient returned back to the operating room on 09/20/2019 for sternal reconstruction after CT surgery noticed instability of lower half of sternum.  Of note patient continues to smoke 1 pack/day, he reports he is trying to quit and is currently on Chantix.  Patient underwent KLS sternal plating on 09/20/2019.  Patient was transferred to [facility] after being seen in the ED in [facility].  Patient underwent sternal wound debridement and application of wound VAC on 09/30/2019 by Dr. Laneta Simmers.  Plastic surgery consulted for evaluation of sternal wound.  Patient's left internal mammary artery was used for LAD grafting.  Patient denies any pain today, doing well.  Denies any fevers, chills, nausea, 9.  Reports he has been smoking most of his life, starting at the age of 2 and was a 4 pack/day smoker until recently and he is down to 1 pack/day.  Currently on Chantix.  Past Medical History:  Diagnosis Date  . Burn erythema of lower leg   . Depression   . Dyspnea   . Essential hypertension 08/04/2019  . Heavy smoker 1 PPD since age 70 08/04/2019  . History of stroke 08/04/2019  . Hyperlipidemia with target LDL less than 70 08/04/2019  . Hypertension   . Ischemic cardiomyopathy 08/03/2019   Echo: EF 35-40% with moderately decreased function.  Global HK.  GR 1 DD.  Elevated LAP.  Mildly elevated PA P estimated 39 mmHg.  Mild LA dilation.  Normal CVP/RAP  . Multivessel coronary artery disease involving native coronary artery of native heart with unstable angina pectoris (HCC) 08/03/2019   Cardiac Cath-PTCA: Significant 3V CAD.  Culprit lesion- 100%  mOM1 (PTCA only - 25%), ostial and prox LAD 90%-ostD1 80% (bifurcation), mid to distal RCA 60%.  EF 35-40%.  Moderately elevated LVEDP. -->  PTCA only performed to avoid DAPT requirement (no P2 Y 12 inhibitor).  CVTS consultation for CABG.  . NSTEMI (non-ST elevated myocardial infarction) (HCC) 08/03/2019  . Tobacco use     Past Surgical History:  Procedure Laterality Date  . APPLICATION OF WOUND VAC N/A 09/30/2019   Procedure: APPLICATION OF WOUND VAC;  Surgeon: Alleen Borne, MD;  Location: [facility] OR;  Service: Thoracic;  Laterality: N/A;  . BACK SURGERY    . CARPAL TUNNEL RELEASE    . COLON SURGERY    . CORONARY ANGIOPLASTY  08/03/2019   Dr. Romilda Joy: PTCA only 100% OM1 in setting of non-STEMI; plan for multivessel CABG  . CORONARY ARTERY BYPASS GRAFT N/A 08/05/2019   Procedure: CORONARY ARTERY BYPASS GRAFTING (CABG), ON PUMP, TIMES THREE, USING LEFT INTERNAL MAMMARY ARTERY AND ENDOSCOPICALLY HARVESTED LEFT GREATER SAPHENOUS VEIN;  Surgeon: Alleen Borne, MD;  Location: [facility] OR;  Service: Open Heart Surgery;  Laterality: N/A;  LIMA to LAD, SVG to DIAGONAL 1, SVG to OM1  . CORONARY BALLOON ANGIOPLASTY N/A 08/03/2019   Procedure: CORONARY BALLOON ANGIOPLASTY;  Surgeon: Iran Ouch, MD;  Location: [facility] INVASIVE CV LAB;  Service: Cardiovascular;  Laterality: N/A;  . JOINT REPLACEMENT    . LEFT HEART CATH AND CORONARY ANGIOGRAPHY  08/03/2019   Dr. Kirke Corin: Significant 3V CAD.  Culprit lesion- 100% mOM1 (PTCA only - 25%),  ostial and prox LAD 90%-ostD1 80% (bifurcation), mid to distal RCA 60%.  EF 35-40%.  Moderately elevated LVEDP. -->  PTCA only performed to avoid DAPT requirement (no P2 Y 12 inhibitor).  CVTS consultation for CABG.   . LEFT HEART CATH AND CORONARY ANGIOGRAPHY N/A 08/03/2019   Procedure: LEFT HEART CATH AND CORONARY ANGIOGRAPHY;  Surgeon: Iran Ouch, MD;  Location: [facility] INVASIVE CV LAB;  Service: Cardiovascular;  Laterality: N/A;  . REPLACEMENT TOTAL KNEE    . RIB PLATING  N/A 09/20/2019   Procedure: RIB PLATING WITH KLS MARTIN STERNAL PLATE;  Surgeon: Alleen Borne, MD;  Location: [facility] OR;  Service: Thoracic;  Laterality: N/A;  . STERNAL INCISION RECLOSURE N/A 09/20/2019   Procedure: STERNAL RECONSTRUCTION AND STERNAL REWIRING;  Surgeon: Alleen Borne, MD;  Location: [facility] OR;  Service: Thoracic;  Laterality: N/A;  . STERNAL WOUND DEBRIDEMENT N/A 09/30/2019   Procedure: STERNAL WOUND DEBRIDEMENT;  Surgeon: Alleen Borne, MD;  Location: [facility] OR;  Service: Thoracic;  Laterality: N/A;  . TEE WITHOUT CARDIOVERSION N/A 08/05/2019   Procedure: TRANSESOPHAGEAL ECHOCARDIOGRAM (TEE);  Surgeon: Alleen Borne, MD;  Location: [facility] OR;  Service: Open Heart Surgery;  Laterality: N/A;  . TRANSTHORACIC ECHOCARDIOGRAM  08/03/2019    EF 35-40% with moderately decreased function.  Global HK.  GR 1 DD.  Elevated LAP.  Mildly elevated PA P estimated 39 mmHg.  Mild LA dilation.  Normal CVP/RAP    Family History  Problem Relation Age of Onset  . Diabetes Mother   . Cancer Father     Social History:  reports that he has been smoking cigarettes. He has a 46.00 pack-year smoking history. He has never used smokeless tobacco. He reports current alcohol use of about 2.0 standard drinks of alcohol per week. He reports that he does not use drugs.  Allergies: No Known Allergies  Medications: I have reviewed the patient's current medications.  Results for orders placed or performed during the hospital encounter of 09/28/19 (from the past 48 hour(s))  Glucose, capillary     Status: Abnormal   Collection Time: 10/01/19  4:45 PM  Result Value Ref Range   Glucose-Capillary 100 (H) 70 - 99 mg/dL    Comment: Glucose reference range applies only to samples taken after fasting for at least 8 hours.   Comment 1 Notify RN    Comment 2 Document in Chart   Glucose, capillary     Status: Abnormal   Collection Time: 10/01/19  8:15 PM  Result Value Ref Range   Glucose-Capillary 199 (H) 70 - 99 mg/dL     Comment: Glucose reference range applies only to samples taken after fasting for at least 8 hours.  Glucose, capillary     Status: Abnormal   Collection Time: 10/02/19 12:48 AM  Result Value Ref Range   Glucose-Capillary 118 (H) 70 - 99 mg/dL    Comment: Glucose reference range applies only to samples taken after fasting for at least 8 hours.  Glucose, capillary     Status: Abnormal   Collection Time: 10/02/19  4:23 AM  Result Value Ref Range   Glucose-Capillary 114 (H) 70 - 99 mg/dL    Comment: Glucose reference range applies only to samples taken after fasting for at least 8 hours.  Glucose, capillary     Status: Abnormal   Collection Time: 10/02/19  7:37 AM  Result Value Ref Range   Glucose-Capillary 135 (H) 70 - 99 mg/dL    Comment: Glucose reference  range applies only to samples taken after fasting for at least 8 hours.   Comment 1 Notify RN    Comment 2 Document in Chart   Glucose, capillary     Status: None   Collection Time: 10/02/19 11:30 AM  Result Value Ref Range   Glucose-Capillary 89 70 - 99 mg/dL    Comment: Glucose reference range applies only to samples taken after fasting for at least 8 hours.   Comment 1 Notify RN    Comment 2 Document in Chart   Glucose, capillary     Status: Abnormal   Collection Time: 10/02/19  4:09 PM  Result Value Ref Range   Glucose-Capillary 102 (H) 70 - 99 mg/dL    Comment: Glucose reference range applies only to samples taken after fasting for at least 8 hours.   Comment 1 Notify RN    Comment 2 Document in Chart   Glucose, capillary     Status: Abnormal   Collection Time: 10/02/19  7:59 PM  Result Value Ref Range   Glucose-Capillary 119 (H) 70 - 99 mg/dL    Comment: Glucose reference range applies only to samples taken after fasting for at least 8 hours.  Glucose, capillary     Status: None   Collection Time: 10/02/19 11:33 PM  Result Value Ref Range   Glucose-Capillary 95 70 - 99 mg/dL    Comment: Glucose reference range applies  only to samples taken after fasting for at least 8 hours.  Glucose, capillary     Status: Abnormal   Collection Time: 10/03/19  4:14 AM  Result Value Ref Range   Glucose-Capillary 105 (H) 70 - 99 mg/dL    Comment: Glucose reference range applies only to samples taken after fasting for at least 8 hours.  Glucose, capillary     Status: Abnormal   Collection Time: 10/03/19  7:54 AM  Result Value Ref Range   Glucose-Capillary 122 (H) 70 - 99 mg/dL    Comment: Glucose reference range applies only to samples taken after fasting for at least 8 hours.   Comment 1 Notify RN    Comment 2 Document in Chart   Glucose, capillary     Status: None   Collection Time: 10/03/19 11:30 AM  Result Value Ref Range   Glucose-Capillary 97 70 - 99 mg/dL    Comment: Glucose reference range applies only to samples taken after fasting for at least 8 hours.  Vancomycin, trough     Status: Abnormal   Collection Time: 10/03/19 12:45 PM  Result Value Ref Range   Vancomycin Tr 21 (HH) 15 - 20 ug/mL    Comment: CRITICAL RESULT CALLED TO, READ BACK BY AND VERIFIED WITH: SUZANNE SETH,RN AT 1358 10/03/2019 BY ZBEECH. Performed at [facility], [address], [place], [place] [address]     No results found.  Review of Systems  Constitutional: Negative for chills and fever.  Cardiovascular: Negative.   Gastrointestinal: Negative.    Blood pressure 138/78, pulse 63, temperature 97.8 F (36.6 C), temperature source Oral, resp. rate 14, height 5\' 6"  (1.676 m), weight 88 kg, SpO2 92 %. Physical Exam Constitutional:      General: He is not in acute distress.    Appearance: He is not ill-appearing.  HENT:     Head: Normocephalic and atraumatic.  Cardiovascular:     Pulses: Normal pulses.     Comments: Sternal wound with exposed bone/cartilage, granulation tissue noted, sternal wires and plating noted superiorly.  Wound is 17 x 5 x 4 cm.  No periwound cellulitis noted.  No foul odor.  No purulence.  Minimal  tenderness to palpation of the periwound area.  Serosanguineous drainage in wound VAC canister, approximately 350 cc. Chest:    Musculoskeletal:     Right lower leg: No edema.     Left lower leg: No edema.  Neurological:     General: No focal deficit present.     Mental Status: He is alert and oriented to person, place, and time.  Psychiatric:        Mood and Affect: Mood normal.        Behavior: Behavior normal.     Assessment/Plan:  Plan for debridement of sternal wound, right pectoralis turnover flap, possible placement of wound matrix, possible primary closure and placement of incisional wound VAC on 10/05/2019 with Dr. Ulice Boldillingham.  Case has been discussed with CT surgery who will be present.  Appreciate their assistance.  Recommend protein shakes (Ensure) for optimizing nutritional status.  Patient currently on ceftazidime and vancomycin.  No current anticoagulation.  Pictures were obtained of the patient and placed in the chart with the patient's or guardian's permission.  Kermit BaloMatthew J Teriana Danker, PA-C 10/03/2019, 3:49 PM

## 2019-10-03 NOTE — Progress Notes (Addendum)
CRITICAL VALUE ALERT  Critical Value:  Vancomycin trough level 21  Date & Time Notied:  10/03/2019 at 1400   Provider Notified: Pharmacy  Orders Received/Actions taken: see orders

## 2019-10-03 NOTE — Consult Note (Addendum)
WOC Nurse Consult Note: Reason for Consult: Consult requested for Vac dressing change to midline sternal wound Wound type: Full thickness post-op wound Dressing procedure/placement/frequency: Requested to wait until plastic surgery can  assess the wound appearance when the Vac dressing is removed.    1500: Post note: Plastic surgery team at bedside to assess wound during Vac dressing change.  Wound is beefy red and moist; visible wires at top, 17X5X4cm. Applied Mepitel contact layer and one piece black foam to 75 mm cont suction.  Pt was medicated prior to the procedure and tolerated with minimal discomfort.  WOC team will plan to change dressing again on Wed if he does not go to the OR prior to that time.  Cammie Mcgee MSN, RN, CWOCN, [place], CNS [phone]

## 2019-10-04 LAB — GLUCOSE, CAPILLARY
Glucose-Capillary: 119 mg/dL — ABNORMAL HIGH (ref 70–99)
Glucose-Capillary: 126 mg/dL — ABNORMAL HIGH (ref 70–99)
Glucose-Capillary: 156 mg/dL — ABNORMAL HIGH (ref 70–99)
Glucose-Capillary: 73 mg/dL (ref 70–99)
Glucose-Capillary: 77 mg/dL (ref 70–99)
Glucose-Capillary: 86 mg/dL (ref 70–99)
Glucose-Capillary: 92 mg/dL (ref 70–99)

## 2019-10-04 LAB — AEROBIC/ANAEROBIC CULTURE W GRAM STAIN (SURGICAL/DEEP WOUND): Gram Stain: NONE SEEN

## 2019-10-04 MED ORDER — LEVALBUTEROL HCL 0.63 MG/3ML IN NEBU: 0.6300 mg | INHALATION_SOLUTION | Freq: Three times a day (TID) | RESPIRATORY_TRACT | Status: DC | PRN

## 2019-10-04 MED ORDER — CHLORHEXIDINE GLUCONATE CLOTH 2 % EX PADS
6.0000 | MEDICATED_PAD | Freq: Once | CUTANEOUS | Status: AC
  Administered 2019-10-04: 6 via TOPICAL

## 2019-10-04 MED ORDER — CHLORHEXIDINE GLUCONATE CLOTH 2 % EX PADS
6.0000 | MEDICATED_PAD | Freq: Once | CUTANEOUS | Status: AC
  Administered 2019-10-04 – 2019-10-05 (×2): 6 via TOPICAL

## 2019-10-04 MED ORDER — CEFAZOLIN SODIUM-DEXTROSE 2-4 GM/100ML-% IV SOLN
2.0000 g | INTRAVENOUS | Status: AC
  Administered 2019-10-05: 2 g via INTRAVENOUS
  Filled 2019-10-04: qty 100

## 2019-10-04 NOTE — Progress Notes (Addendum)
      [address]       [place] [address]             [phone]      4 Days Post-Op Procedure(s) (LRB): STERNAL WOUND DEBRIDEMENT (N/A) APPLICATION OF WOUND VAC (N/A) Subjective: Feels okay this morning. Despite pre-medication the wound vac change yesterday hurt per the patient.   Objective: Vital signs in last 24 hours: Temp:  [97.6 F (36.4 C)-98.1 F (36.7 C)] 97.8 F (36.6 C) (07/13 0348) Pulse Rate:  [60-77] 63 (07/13 0653) Cardiac Rhythm: Normal sinus rhythm (07/13 0730) Resp:  [11-19] 14 (07/13 0653) BP: (113-138)/(68-78) 113/68 (07/13 0348) SpO2:  [90 %-99 %] 90 % (07/13 0653)     Intake/Output from previous day: 07/12 0701 - 07/13 0700 In: 1353 [P.O.:1200; I.V.:3; IV Piggyback:150] Out: 1900 [Urine:1875; Drains:25] Intake/Output this shift: No intake/output data recorded.  General appearance: alert, cooperative and no distress Heart: regular rate and rhythm, S1, S2 normal, no murmur, click, rub or gallop Lungs: wheezes bilateral upper lobes Abdomen: soft, non-tender; bowel sounds normal; no masses,  no organomegaly Extremities: extremities normal, atraumatic, no cyanosis or edema Wound: wound vac with good suction  Lab Results: No results for input(s): WBC, HGB, HCT, PLT in the last 72 hours. BMET: No results for input(s): NA, K, CL, CO2, GLUCOSE, BUN, CREATININE, CALCIUM in the last 72 hours.  PT/INR: No results for input(s): LABPROT, INR in the last 72 hours. ABG    Component Value Date/Time   PHART 7.409 09/16/2019 1059   HCO3 22.2 09/16/2019 1059   TCO2 27 08/05/2019 1859   ACIDBASEDEF 1.8 09/16/2019 1059   O2SAT 98.1 09/16/2019 1059   CBG (last 3)  Recent Labs    10/03/19 2001 10/04/19 0004 10/04/19 0434  GLUCAP 112* 119* 77    Assessment/Plan: S/P Procedure(s) (LRB): STERNAL WOUND DEBRIDEMENT (N/A) APPLICATION OF WOUND VAC (N/A)  1. CV-Remains in NSR in the 60s. BP well controlled. Continue Amiodarone 200mg   daily, Lopressor 12.5mg  BID, asa and statin. 2. Pulm-Continue to wean oxygen as able. Will make xopenex nebs available. 3. Wound vac in place with good suction. Due to be changed tomorrow 4. ID-on Vancomycin Staph Epi on cultures.  5. CBGs with good control on current regimen 6. Continue wet-to-dry dressing daily on LLE EVH site  Plan: Per plastic surgery to OR  tomorrow. Continue Vancomycin IV. Continue ambulation in the halls and use of incentive spirometer.    LOS: 6 days    10/04/2019    Chart reviewed, patient examined, agree with above. Plan reconstruction tomorrow by Plastic Surgery. I will be there for assistance with any further debridement and to help any way I can. I discussed the procedure with the patient and answered his questions.

## 2019-10-04 NOTE — Plan of Care (Signed)

## 2019-10-05 ENCOUNTER — Inpatient Hospital Stay (HOSPITAL_COMMUNITY): Payer: Medicare Other

## 2019-10-05 ENCOUNTER — Encounter (HOSPITAL_COMMUNITY)
Admission: AD | Disposition: A | Discharge status: Home / Self Care | Payer: Self-pay | Source: Other Acute Inpatient Hospital | Attending: Surgery

## 2019-10-05 ENCOUNTER — Encounter: Payer: Medicare Other | Admitting: Surgery

## 2019-10-05 ENCOUNTER — Encounter (HOSPITAL_COMMUNITY): Payer: Self-pay | Admitting: Surgery

## 2019-10-05 DIAGNOSIS — S2190XA Unspecified open wound of unspecified part of thorax, initial encounter: Secondary | ICD-10-CM

## 2019-10-05 DIAGNOSIS — S21101A Unspecified open wound of right front wall of thorax without penetration into thoracic cavity, initial encounter: Secondary | ICD-10-CM

## 2019-10-05 HISTORY — PX: INCISION AND DRAINAGE OF WOUND: SHX1803

## 2019-10-05 HISTORY — PX: ADJACENT TISSUE TRANSFER/TISSUE REARRANGEMENT: SHX6829

## 2019-10-05 HISTORY — PX: IRRIGATION AND DEBRIDEMENT STERNOCLAVICULAR JOINT-STERNUM AND RIBS: SHX6785

## 2019-10-05 LAB — GLUCOSE, CAPILLARY
Glucose-Capillary: 106 mg/dL — ABNORMAL HIGH (ref 70–99)
Glucose-Capillary: 134 mg/dL — ABNORMAL HIGH (ref 70–99)
Glucose-Capillary: 178 mg/dL — ABNORMAL HIGH (ref 70–99)
Glucose-Capillary: 180 mg/dL — ABNORMAL HIGH (ref 70–99)
Glucose-Capillary: 92 mg/dL (ref 70–99)
Glucose-Capillary: 94 mg/dL (ref 70–99)

## 2019-10-05 LAB — AEROBIC/ANAEROBIC CULTURE W GRAM STAIN (SURGICAL/DEEP WOUND)

## 2019-10-05 SURGERY — IRRIGATION AND DEBRIDEMENT OF STERNOCLAVICULAR JOINT-STERNUM AND RIBS
Anesthesia: General | Site: Chest

## 2019-10-05 MED ORDER — PROPOFOL 10 MG/ML IV BOLUS
INTRAVENOUS | Status: AC
  Filled 2019-10-05: qty 40

## 2019-10-05 MED ORDER — PROPOFOL 10 MG/ML IV BOLUS
INTRAVENOUS | Status: DC | PRN
  Administered 2019-10-05: 100 mg via INTRAVENOUS

## 2019-10-05 MED ORDER — ONDANSETRON HCL 4 MG/2ML IJ SOLN
INTRAMUSCULAR | Status: DC | PRN
  Administered 2019-10-05: 4 mg via INTRAVENOUS

## 2019-10-05 MED ORDER — EPHEDRINE SULFATE-NACL 50-0.9 MG/10ML-% IV SOSY
PREFILLED_SYRINGE | INTRAVENOUS | Status: DC | PRN
  Administered 2019-10-05 (×2): 10 mg via INTRAVENOUS
  Administered 2019-10-05: 5 mg via INTRAVENOUS
  Administered 2019-10-05: 10 mg via INTRAVENOUS

## 2019-10-05 MED ORDER — ONDANSETRON HCL 4 MG/2ML IJ SOLN
INTRAMUSCULAR | Status: AC
  Filled 2019-10-05: qty 2

## 2019-10-05 MED ORDER — ROCURONIUM BROMIDE 10 MG/ML (PF) SYRINGE
PREFILLED_SYRINGE | INTRAVENOUS | Status: DC | PRN
  Administered 2019-10-05: 10 mg via INTRAVENOUS
  Administered 2019-10-05: 60 mg via INTRAVENOUS
  Administered 2019-10-05 (×2): 10 mg via INTRAVENOUS

## 2019-10-05 MED ORDER — PHENYLEPHRINE HCL-NACL 10-0.9 MG/250ML-% IV SOLN
INTRAVENOUS | Status: DC | PRN
  Administered 2019-10-05: 20 ug/min via INTRAVENOUS

## 2019-10-05 MED ORDER — LIDOCAINE 2% (20 MG/ML) 5 ML SYRINGE
INTRAMUSCULAR | Status: DC | PRN
  Administered 2019-10-05: 60 mg via INTRAVENOUS

## 2019-10-05 MED ORDER — CHLORHEXIDINE GLUCONATE 0.12 % MT SOLN
15.0000 mL | Freq: Once | OROMUCOSAL | Status: AC
  Administered 2019-10-05: 15 mL via OROMUCOSAL
  Filled 2019-10-05: qty 15

## 2019-10-05 MED ORDER — EPHEDRINE 5 MG/ML INJ
INTRAVENOUS | Status: AC
  Filled 2019-10-05: qty 20

## 2019-10-05 MED ORDER — FENTANYL CITRATE (PF) 100 MCG/2ML IJ SOLN
INTRAMUSCULAR | Status: DC | PRN
  Administered 2019-10-05: 50 ug via INTRAVENOUS
  Administered 2019-10-05: 100 ug via INTRAVENOUS

## 2019-10-05 MED ORDER — SODIUM CHLORIDE 0.9 % IV SOLN
INTRAVENOUS | Status: AC
  Filled 2019-10-05: qty 500000

## 2019-10-05 MED ORDER — OXYCODONE HCL 5 MG PO TABS: 5.0000 mg | ORAL_TABLET | Freq: Once | ORAL | Status: DC | PRN

## 2019-10-05 MED ORDER — ORAL CARE MOUTH RINSE: 15.0000 mL | Freq: Once | OROMUCOSAL | Status: AC

## 2019-10-05 MED ORDER — DEXAMETHASONE SODIUM PHOSPHATE 10 MG/ML IJ SOLN
INTRAMUSCULAR | Status: DC | PRN
Start: 2019-10-05 — End: 2019-10-05
  Administered 2019-10-05: 10 mg via INTRAVENOUS

## 2019-10-05 MED ORDER — LEVOTHYROXINE SODIUM 100 MCG PO TABS
100.0000 ug | ORAL_TABLET | Freq: Every day | ORAL | Status: DC
  Administered 2019-10-06 – 2019-10-09 (×4): 100 ug via ORAL
  Filled 2019-10-05 (×4): qty 1

## 2019-10-05 MED ORDER — FENTANYL CITRATE (PF) 250 MCG/5ML IJ SOLN
INTRAMUSCULAR | Status: AC
  Filled 2019-10-05: qty 5

## 2019-10-05 MED ORDER — ONDANSETRON HCL 4 MG/2ML IJ SOLN: 4.0000 mg | Freq: Once | INTRAMUSCULAR | Status: DC | PRN

## 2019-10-05 MED ORDER — LIDOCAINE-EPINEPHRINE 1 %-1:100000 IJ SOLN
INTRAMUSCULAR | Status: AC
  Filled 2019-10-05: qty 1

## 2019-10-05 MED ORDER — METOPROLOL TARTRATE 12.5 MG HALF TABLET
12.5000 mg | ORAL_TABLET | Freq: Two times a day (BID) | ORAL | Status: DC
  Administered 2019-10-05 – 2019-10-09 (×6): 12.5 mg via ORAL
  Filled 2019-10-05 (×8): qty 1

## 2019-10-05 MED ORDER — ROCURONIUM BROMIDE 10 MG/ML (PF) SYRINGE
PREFILLED_SYRINGE | INTRAVENOUS | Status: AC
  Filled 2019-10-05: qty 10

## 2019-10-05 MED ORDER — SUCCINYLCHOLINE CHLORIDE 200 MG/10ML IV SOSY
PREFILLED_SYRINGE | INTRAVENOUS | Status: AC
  Filled 2019-10-05: qty 10

## 2019-10-05 MED ORDER — SUGAMMADEX SODIUM 200 MG/2ML IV SOLN
INTRAVENOUS | Status: DC | PRN
Start: 2019-10-05 — End: 2019-10-05
  Administered 2019-10-05 (×2): 100 mg via INTRAVENOUS

## 2019-10-05 MED ORDER — OXYCODONE HCL 5 MG/5ML PO SOLN: 5.0000 mg | Freq: Once | ORAL | Status: DC | PRN

## 2019-10-05 MED ORDER — HEMOSTATIC AGENTS (NO CHARGE) OPTIME
TOPICAL | Status: DC | PRN
  Administered 2019-10-05: 1 via TOPICAL

## 2019-10-05 MED ORDER — LIDOCAINE 2% (20 MG/ML) 5 ML SYRINGE
INTRAMUSCULAR | Status: AC
  Filled 2019-10-05: qty 5

## 2019-10-05 MED ORDER — DEXAMETHASONE SODIUM PHOSPHATE 10 MG/ML IJ SOLN
INTRAMUSCULAR | Status: AC
  Filled 2019-10-05: qty 1

## 2019-10-05 MED ORDER — 0.9 % SODIUM CHLORIDE (POUR BTL) OPTIME
TOPICAL | Status: DC | PRN
  Administered 2019-10-05: 2000 mL

## 2019-10-05 MED ORDER — MIDAZOLAM HCL 2 MG/2ML IJ SOLN
INTRAMUSCULAR | Status: AC
  Filled 2019-10-05: qty 2

## 2019-10-05 MED ORDER — FENTANYL CITRATE (PF) 100 MCG/2ML IJ SOLN: 25.0000 ug | INTRAMUSCULAR | Status: DC | PRN

## 2019-10-05 MED ORDER — SODIUM CHLORIDE 0.9 % IV SOLN
INTRAVENOUS | Status: DC | PRN
  Administered 2019-10-05: 500 mL

## 2019-10-05 MED ORDER — LACTATED RINGERS IV SOLN: INTRAVENOUS | Status: DC

## 2019-10-05 SURGICAL SUPPLY — 67 items
APPLICATOR COTTON TIP 6 STRL (MISCELLANEOUS) IMPLANT
APPLICATOR COTTON TIP 6IN STRL (MISCELLANEOUS) IMPLANT
BAG DECANTER FOR FLEXI CONT (MISCELLANEOUS) IMPLANT
BENZOIN TINCTURE PRP APPL 2/3 (GAUZE/BANDAGES/DRESSINGS) ×2 IMPLANT
CANISTER SUCT 3000ML PPV (MISCELLANEOUS) ×3 IMPLANT
CANISTER WOUND CARE 500ML ATS (WOUND CARE) ×1 IMPLANT
CLIP VESOCCLUDE MED 6/CT (CLIP) ×1 IMPLANT
CNTNR URN SCR LID CUP LEK RST (MISCELLANEOUS) IMPLANT
CONT SPEC 4OZ STRL OR WHT (MISCELLANEOUS)
COVER SURGICAL LIGHT HANDLE (MISCELLANEOUS) ×3 IMPLANT
COVER WAND RF STERILE (DRAPES) ×3 IMPLANT
DRAIN CHANNEL 19F RND (DRAIN) ×1 IMPLANT
DRAPE HALF SHEET 40X57 (DRAPES) IMPLANT
DRAPE IMP U-DRAPE 54X76 (DRAPES) ×3 IMPLANT
DRAPE INCISE IOBAN 66X45 STRL (DRAPES) ×1 IMPLANT
DRAPE LAPAROSCOPIC ABDOMINAL (DRAPES) IMPLANT
DRAPE LAPAROTOMY 100X72 PEDS (DRAPES) ×2 IMPLANT
DRAPE WARM FLUID 44X44 (DRAPES) ×1 IMPLANT
DRESSING HYDROCOLLOID 4X4 (GAUZE/BANDAGES/DRESSINGS) ×3 IMPLANT
DRSG ADAPTIC 3X8 NADH LF (GAUZE/BANDAGES/DRESSINGS) IMPLANT
DRSG HYDROCOLLOID 4X4 (GAUZE/BANDAGES/DRESSINGS) ×1 IMPLANT
DRSG PAD ABDOMINAL 8X10 ST (GAUZE/BANDAGES/DRESSINGS) IMPLANT
DRSG VAC ATS LRG SENSATRAC (GAUZE/BANDAGES/DRESSINGS) ×1 IMPLANT
DRSG VAC ATS MED SENSATRAC (GAUZE/BANDAGES/DRESSINGS) ×1 IMPLANT
DRSG VAC ATS SM SENSATRAC (GAUZE/BANDAGES/DRESSINGS) IMPLANT
ELECT BLADE 4.0 EZ CLEAN MEGAD (MISCELLANEOUS) ×3
ELECT BLADE 6.5 EXT (BLADE) ×1 IMPLANT
ELECT CAUTERY BLADE 6.4 (BLADE) ×1 IMPLANT
ELECT REM PT RETURN 9FT ADLT (ELECTROSURGICAL) ×3
ELECTRODE BLDE 4.0 EZ CLN MEGD (MISCELLANEOUS) IMPLANT
ELECTRODE REM PT RTRN 9FT ADLT (ELECTROSURGICAL) ×2 IMPLANT
EVACUATOR SILICONE 100CC (DRAIN) ×1 IMPLANT
GAUZE SPONGE 4X4 12PLY STRL (GAUZE/BANDAGES/DRESSINGS) IMPLANT
GAUZE SPONGE 4X4 12PLY STRL LF (GAUZE/BANDAGES/DRESSINGS) ×1 IMPLANT
GLOVE BIO SURGEON STRL SZ 6.5 (GLOVE) ×4 IMPLANT
GLOVE BIOGEL M STRL SZ7.5 (GLOVE) ×2 IMPLANT
GLOVE INDICATOR 8.0 STRL GRN (GLOVE) ×2 IMPLANT
GOWN STRL REUS W/ TWL LRG LVL3 (GOWN DISPOSABLE) ×6 IMPLANT
GOWN STRL REUS W/TWL LRG LVL3 (GOWN DISPOSABLE) ×2
HEMOSTAT HEMOBLAST BELLOWS (HEMOSTASIS) ×2 IMPLANT
KIT BASIN OR (CUSTOM PROCEDURE TRAY) ×3 IMPLANT
KIT TURNOVER KIT B (KITS) ×3 IMPLANT
MICROMATRIX 1000MG (Tissue) ×3 IMPLANT
NDL HYPO 25GX1X1/2 BEV (NEEDLE) ×2 IMPLANT
NEEDLE HYPO 25GX1X1/2 BEV (NEEDLE) ×3 IMPLANT
NS IRRIG 1000ML POUR BTL (IV SOLUTION) ×3 IMPLANT
PACK GENERAL/GYN (CUSTOM PROCEDURE TRAY) ×3 IMPLANT
PACK UNIVERSAL I (CUSTOM PROCEDURE TRAY) ×3 IMPLANT
PAD ARMBOARD 7.5X6 YLW CONV (MISCELLANEOUS) ×6 IMPLANT
PENCIL BUTTON HOLSTER BLD 10FT (ELECTRODE) ×1 IMPLANT
SOLUTION PARTIC MCRMTRX 1000MG (Tissue) IMPLANT
SPONGE LAP 18X18 X RAY DECT (DISPOSABLE) ×1 IMPLANT
STAPLER VISISTAT 35W (STAPLE) ×3 IMPLANT
SURGILUBE 2OZ TUBE FLIPTOP (MISCELLANEOUS) ×1 IMPLANT
SUT ETHILON 3 0 PS 1 (SUTURE) ×1 IMPLANT
SUT MNCRL AB 3-0 PS2 18 (SUTURE) ×2 IMPLANT
SUT MNCRL AB 4-0 PS2 18 (SUTURE) ×2 IMPLANT
SUT MON AB 2-0 CT1 36 (SUTURE) ×2 IMPLANT
SUT VIC AB 3-0 SH 27 (SUTURE) ×2
SUT VIC AB 3-0 SH 27X BRD (SUTURE) IMPLANT
SUT VIC AB 5-0 PS2 18 (SUTURE) ×2 IMPLANT
SWAB COLLECTION DEVICE MRSA (MISCELLANEOUS) IMPLANT
SWAB CULTURE ESWAB REG 1ML (MISCELLANEOUS) IMPLANT
SYR CONTROL 10ML LL (SYRINGE) ×3 IMPLANT
TAPE CLOTH SURG 4X10 WHT LF (GAUZE/BANDAGES/DRESSINGS) ×1 IMPLANT
TOWEL GREEN STERILE (TOWEL DISPOSABLE) ×3 IMPLANT
UNDERPAD 30X36 HEAVY ABSORB (UNDERPADS AND DIAPERS) ×3 IMPLANT

## 2019-10-05 NOTE — Op Note (Signed)
DATE OF OPERATION: 10/05/2019  LOCATION: [facility] In[facility]  PREOPERATIVE DIAGNOSIS: Sternal / Chest wound   POSTOPERATIVE DIAGNOSIS: Same  PROCEDURE: 1.  Pectoralis Major Muscle Turnover Flap 2.  Excision of nonviable tissue 0.5 x 20 cm skin and fat 3.  Placement of Acell (powder 1 gm) and VAC dressing  SURGEON: Julizza Sassone Sanger Abigaile Rossie, DO  COSURGEON: Evelene Croon, MD  EBL: less than 100 cc  SPECIMEN: None  DRAINS: One 19 blake round drains  CONDITION: Stable  COMPLICATIONS: None  INDICATION: The patient, Matthew [name], is a 64 y.o. male born on 04-30-1955, is here for treatment for a sternal wound after a CABG.  He developed a wound after the surgery with breakage of his sternal wires.  He has undergone debridement.  He presents for closure.      PROCEDURE DETAILS:  The patient was seen on the morning of her surgery and marked. He was given an IV and IV antibiotics. The patient was then taken to the operating room and given a general anesthetic. A standard time out was performed and all information was confirmed by those in the room.  SCD's were placed and all key points padded. He was then prepped and draped in the standard sterile fashion using betadine. The procedure began by washing out the defect with saline and antibiotic solution.  The nonviable skin and soft tissue was excised with a #10 blade.  This included 0.5 x 20 cm of skin and soft tissue.  The base of the wound and sternal bone edges were debrided with the curette.    The medial margin of the right pectoralis major muscle was identified at the sternal margin.  The skin and soft tissue was lifted off the muscle with dissection carried to the insertion.  The muscle was transected with the bovie and a medium clip was applied on the vessel.   The edges of the pectoralis were identifies and using a combination of the bovie and blunt dissection the muscle was freed from the deep attachment.  The muscle was then released  laterally and inferiorly with care as to not include the serratus muscle.  The internal mammary perforators were also left intact.  The ACell powder 1 g was placed in the sternal defect. Hemostasis was assured prior to closure and insetting of the pectoralis muscle flap.    The pectoralis muscle was then turned over onto the defect of the sternum to cover it and fill it in with the muscle.  A drain was placed in the created space of the right chest pocket and secured to the skin with the 3-0 Nylon.  The resulting pocket was irrigated.  Hemablast was placed in the pocket for hemostasis.  It was applied with the manufacture guidelines in mind.    The muscle was then secured to the medial margin of the left pectoralis with 3-0 Vicryl.  The chest and breast on the left was lifted off the pectoralis muscle for 3 cm of undermining.  This was done in the lateral direction so we could gain a more tension-free closure.  This also provided better insetting of the muscle flap.  The deep layers were closed with 2-0 Monocryl.  The following layers were closed with 3-0 and 4-0 Monocryl.  A combination of running subcuticular and interrupted sutures were utilized.  The incision was dressed with Dermabond.  Replicare was applied to the periwound area for protection of the skin.  A VAC was then applied and covered with  Ioban.  There was an excellent seal.  The drain was dressed with sterile 4 x 4 gauze.  The patient was allowed to wake up and taken to recovery room in stable condition at the end of the case. The patient stated that he did not have anyone that he wished for me to call at the completion of the surgery.    Dr. Laneta Simmers participated throughout the case.  He was essential in retraction and counter traction when needed to make the case progress smoothly.  This retraction and assistance made it possible to see the tissue plans for the procedure.  The assistance was needed for blood control and flap elevation.  He was  also essential in protection of the CABG.

## 2019-10-05 NOTE — Anesthesia Preprocedure Evaluation (Addendum)
Anesthesia Evaluation  Patient identified by MRN, date of birth, ID band Patient awake    Reviewed: Allergy & Precautions, NPO status , Patient's Chart, lab work & pertinent test results  History of Anesthesia Complications Negative for: history of anesthetic complications  Airway Mallampati: II  TM Distance: >3 FB Neck ROM: Full    Dental  (+) Edentulous Upper, Edentulous Lower   Pulmonary Current Smoker and Patient abstained from smoking.,    Pulmonary exam normal        Cardiovascular hypertension, Pt. on medications and Pt. on home beta blockers + CAD, + Past MI, + CABG (07/2019) and +CHF  Normal cardiovascular exam+ dysrhythmias Atrial Fibrillation    '21 Intraop TEE - EF 30-35%. Global hypokinesis. Trivial MR and PR. Mild TR. Mild to moderate AI. The AI jet is anteriorly directed. PHT was which is consistent with mild-moderate AI. There is evidence of atheroma immobile plaque in the descending aorta; Grade I, measuring 1-65mm in size.     Neuro/Psych PSYCHIATRIC DISORDERS Depression negative neurological ROS     GI/Hepatic negative GI ROS, Neg liver ROS,   Endo/Other  Hypothyroidism   Renal/GU negative Renal ROS     Musculoskeletal negative musculoskeletal ROS (+)   Abdominal   Peds  Hematology  (+) anemia ,  On coumadin    Anesthesia Other Findings Covid neg 09/16/19   Reproductive/Obstetrics                            Anesthesia Physical Anesthesia Plan  ASA: III  Anesthesia Plan: General   Post-op Pain Management:    Induction: Intravenous  PONV Risk Score and Plan: 2 and Treatment may vary due to age or medical condition, Ondansetron, Dexamethasone and Midazolam  Airway Management Planned: Oral ETT  Additional Equipment: None  Intra-op Plan:   Post-operative Plan: Extubation in OR  Informed Consent: I have reviewed the patients History and Physical, chart,  labs and discussed the procedure including the risks, benefits and alternatives for the proposed anesthesia with the patient or authorized representative who has indicated his/her understanding and acceptance.     Dental advisory given  Plan Discussed with: CRNA and Anesthesiologist  Anesthesia Plan Comments:        Anesthesia Quick Evaluation

## 2019-10-05 NOTE — Interval H&P Note (Signed)
History and Physical Interval Note:  10/05/2019 12:54 PM  Matthew [name]  has presented today for surgery, with the diagnosis of STERNAL WOUND.  The various methods of treatment have been discussed with the patient and family. After consideration of risks, benefits and other options for treatment, the patient has consented to  Procedure(s): IRRIGATION AND DEBRIDEMENT OF STERNOCLAVICULAR JOINT-STERNUM AND RIBS (N/A) ADJACENT TISSUE TRANSFER/TISSUE REARRANGEMENT, ABRA MUSCLE FLAP (N/A) IRRIGATION AND DEBRIDEMENT STERNUM WOUND POSSIBLE PLACEMENT OF A CELL (N/A) as a surgical intervention.  The patient's history has been reviewed, patient examined, no change in status, stable for surgery.  I have reviewed the patient's chart and labs.  Questions were answered to the patient's satisfaction.     Alena Bills June Vacha

## 2019-10-05 NOTE — Anesthesia Procedure Notes (Signed)
Procedure Name: Intubation Date/Time: 10/05/2019 1:52 PM Performed by: Trinna Post., CRNA Pre-anesthesia Checklist: Patient identified, Emergency Drugs available, Suction available, Patient being monitored and Timeout performed Patient Re-evaluated:Patient Re-evaluated prior to induction Oxygen Delivery Method: Circle system utilized Preoxygenation: Pre-oxygenation with 100% oxygen Induction Type: IV induction Ventilation: Mask ventilation without difficulty Laryngoscope Size: Mac and 4 Grade View: Grade I Tube type: Oral Tube size: 7.5 mm Number of attempts: 1 Airway Equipment and Method: Stylet Placement Confirmation: ETT inserted through vocal cords under direct vision,  positive ETCO2 and breath sounds checked- equal and bilateral Secured at: 21 cm Tube secured with: Tape Dental Injury: Teeth and Oropharynx as per pre-operative assessment

## 2019-10-05 NOTE — Transfer of Care (Signed)
Immediate Anesthesia Transfer of Care Note  Patient: Matthew [name]  Procedure(s) Performed: IRRIGATION AND DEBRIDEMENT OF STERNOCLAVICULAR JOINT-STERNUM AND RIBS (N/A Chest) ADJACENT TISSUE TRANSFER/TISSUE REARRANGEMENT, ABRA MUSCLE FLAP (N/A ) IRRIGATION AND DEBRIDEMENT STERNUM WOUND POSSIBLE PLACEMENT OF A CELL (N/A )  Patient Location: PACU  Anesthesia Type:General  Level of Consciousness: awake, alert  and oriented  Airway & Oxygen Therapy: Patient Spontanous Breathing and Patient connected to nasal cannula oxygen  Post-op Assessment: Report given to RN and Post -op Vital signs reviewed and stable  Post vital signs: Reviewed and stable  Last Vitals:  Vitals Value Taken Time  BP 89/57 10/05/19 1609  Temp    Pulse 79 10/05/19 1610  Resp 27 10/05/19 1610  SpO2 90 % 10/05/19 1610  Vitals shown include unvalidated device data.  Last Pain:  Vitals:   10/05/19 1315  TempSrc:   PainSc: 4       Patients Stated Pain Goal: 0 (10/05/19 1315)  Complications: No complications documented.

## 2019-10-05 NOTE — Progress Notes (Signed)
      [address]       [place] [address]             [phone]      5 Days Post-Op Procedure(s) (LRB): STERNAL WOUND DEBRIDEMENT (N/A) APPLICATION OF WOUND VAC (N/A) Subjective:  To the OR today with plastic surgery. All questions answered to the patient's satisfaction.   Objective: Vital signs in last 24 hours: Temp:  [97.6 F (36.4 C)-98.4 F (36.9 C)] 98.3 F (36.8 C) (07/14 0345) Pulse Rate:  [54-67] 61 (07/14 0345) Cardiac Rhythm: Normal sinus rhythm (07/14 0345) Resp:  [11-14] 13 (07/14 0345) BP: (101-138)/(62-71) 126/71 (07/14 0345) SpO2:  [91 %-96 %] 93 % (07/14 0345)     Intake/Output from previous day: 07/13 0701 - 07/14 0700 In: 2143 [P.O.:1680; I.V.:13; IV Piggyback:450] Out: 3275 [Urine:3225; Drains:50] Intake/Output this shift: No intake/output data recorded.  General appearance: alert, cooperative and no distress Heart: regular rate and rhythm, S1, S2 normal, no murmur, click, rub or gallop Lungs: clear to auscultation bilaterally and some congestion Abdomen: soft, non-tender; bowel sounds normal; no masses,  no organomegaly Extremities: extremities normal, atraumatic, no cyanosis or edema Wound: wound vac with good suction  Lab Results: No results for input(s): WBC, HGB, HCT, PLT in the last 72 hours. BMET: No results for input(s): NA, K, CL, CO2, GLUCOSE, BUN, CREATININE, CALCIUM in the last 72 hours.  PT/INR: No results for input(s): LABPROT, INR in the last 72 hours. ABG    Component Value Date/Time   PHART 7.409 09/16/2019 1059   HCO3 22.2 09/16/2019 1059   TCO2 27 08/05/2019 1859   ACIDBASEDEF 1.8 09/16/2019 1059   O2SAT 98.1 09/16/2019 1059   CBG (last 3)  Recent Labs    10/04/19 1942 10/04/19 2348 10/05/19 0346  GLUCAP 92 126* 94    Assessment/Plan: S/P Procedure(s) (LRB): STERNAL WOUND DEBRIDEMENT (N/A) APPLICATION OF WOUND VAC (N/A)   1. CV-Remains in NSR in the 60s-70s. BP well controlled. Continue  Amiodarone 200mg  daily, Lopressor 12.5mg  BID, asa and statin. 2. Pulm-Continue to wean oxygen as able. Will make xopenex nebs available. 3. Wound vac in place with good suction. 4. ID-on Vancomycin Staph Epi on cultures.  5. CBGs with good control on current regimen 6. Continue wet-to-dry dressingdailyon LLE EVH site  Plan: To the OR today for muscle flap with plastics. All questions answered and the patient knows what to expect.     LOS: 7 days    10/05/2019

## 2019-10-06 ENCOUNTER — Other Ambulatory Visit: Payer: Self-pay | Admitting: Thoracic Surgery (Cardiothoracic Vascular Surgery)

## 2019-10-06 ENCOUNTER — Encounter (HOSPITAL_COMMUNITY): Payer: Self-pay | Admitting: Surgery

## 2019-10-06 DIAGNOSIS — Z951 Presence of aortocoronary bypass graft: Secondary | ICD-10-CM

## 2019-10-06 LAB — CBC
HCT: 32.9 % — ABNORMAL LOW (ref 39.0–52.0)
Hemoglobin: 10.5 g/dL — ABNORMAL LOW (ref 13.0–17.0)
MCH: 28.9 pg (ref 26.0–34.0)
MCHC: 31.9 g/dL (ref 30.0–36.0)
MCV: 90.6 fL (ref 80.0–100.0)
Platelets: 522 10*3/uL — ABNORMAL HIGH (ref 150–400)
RBC: 3.63 MIL/uL — ABNORMAL LOW (ref 4.22–5.81)
RDW: 13.6 % (ref 11.5–15.5)
WBC: 13.8 10*3/uL — ABNORMAL HIGH (ref 4.0–10.5)
nRBC: 0 % (ref 0.0–0.2)

## 2019-10-06 LAB — GLUCOSE, CAPILLARY
Glucose-Capillary: 166 mg/dL — ABNORMAL HIGH (ref 70–99)
Glucose-Capillary: 152 mg/dL — ABNORMAL HIGH (ref 70–99)
Glucose-Capillary: 121 mg/dL — ABNORMAL HIGH (ref 70–99)
Glucose-Capillary: 129 mg/dL — ABNORMAL HIGH (ref 70–99)
Glucose-Capillary: 123 mg/dL — ABNORMAL HIGH (ref 70–99)

## 2019-10-06 LAB — BASIC METABOLIC PANEL
Anion gap: 9 (ref 5–15)
BUN: 18 mg/dL (ref 8–23)
CO2: 26 mmol/L (ref 22–32)
Calcium: 8.8 mg/dL — ABNORMAL LOW (ref 8.9–10.3)
Chloride: 99 mmol/L (ref 98–111)
Creatinine, Ser: 0.97 mg/dL (ref 0.61–1.24)
GFR calc Af Amer: >60 mL/min (ref >60)
GFR calc non Af Amer: >60 mL/min (ref >60)
Glucose, Bld: 138 mg/dL — ABNORMAL HIGH (ref 70–99)
Potassium: 4.5 mmol/L (ref 3.5–5.1)
Sodium: 134 mmol/L — ABNORMAL LOW (ref 135–145)

## 2019-10-06 MED ORDER — ENSURE MAX PROTEIN PO LIQD
11.0000 [oz_av] | Freq: Every day | ORAL | Status: DC
  Administered 2019-10-06 – 2019-10-12 (×7): 11 [oz_av] via ORAL
  Administered 2019-10-15: 237 mL via ORAL
  Administered 2019-10-16: 11 [oz_av] via ORAL
  Filled 2019-10-06 (×12): qty 330

## 2019-10-06 NOTE — Progress Notes (Addendum)
1 Day Post-Op  Subjective: Patient is doing well, feels okay.   Dr. Ulice Bold present during today's evaluation.  Objective: Vital signs in last 24 hours: Temp:  [97.4 F (36.3 C)-98 F (36.7 C)] 97.4 F (36.3 C) (07/15 1122) Pulse Rate:  [57-76] 65 (07/15 1122) Resp:  [16-19] 17 (07/15 1122) BP: (89-120)/(57-72) 107/70 (07/15 1122) SpO2:  [92 %-100 %] 96 % (07/15 1122) Last BM Date: 10/03/19  Intake/Output from previous day: 07/14 0701 - 07/15 0700 In: 1620 [P.O.:720; I.V.:800; IV Piggyback:100] Out: 2110 [Urine:1825; Drains:210; Blood:75] Intake/Output this shift: Total I/O In: -  Out: 750 [Urine:700; Drains:50]  General appearance: alert, cooperative, no distress and sitting up in his chair at the bedside. Head: Normocephalic, without obvious abnormality, atraumatic Chest wall: Mild tenderness to palpation of right chest wall, no fluid collection noted. No erythema. No swelling. JP drain in place with serosanguinous fluid in bulb. Wound vac in place with good seal noted, no drainage in canister.  JP drain output 210 cc past 24 hrs.   Lab Results:  CBC Latest Ref Rng & Units 10/06/2019 10/01/2019 09/29/2019  WBC 4.0 - 10.5 K/uL 13.8(H) 8.8 7.7  Hemoglobin 13.0 - 17.0 g/dL 10.5(L) 9.7(L) 9.2(L)  Hematocrit 39 - 52 % 32.9(L) 30.1(L) 28.6(L)  Platelets 150 - 400 K/uL 522(H) 445(H) 363    BMET Recent Labs    10/06/19 0235  NA 134*  K 4.5  CL 99  CO2 26  GLUCOSE 138*  BUN 18  CREATININE 0.97  CALCIUM 8.8*   PT/INR No results for input(s): LABPROT, INR in the last 72 hours. ABG No results for input(s): PHART, HCO3 in the last 72 hours.  Invalid input(s): PCO2, PO2  Studies/Results: No results found.  Anti-infectives: Anti-infectives (From admission, onward)   Start     Dose/Rate Route Frequency Ordered Stop   10/05/19 1412  polymyxin B 500,000 Units, bacitracin 50,000 Units in sodium chloride 0.9 % 500 mL irrigation  Status:  Discontinued          As needed  10/05/19 1412 10/05/19 1605   10/05/19 0600  ceFAZolin (ANCEF) IVPB 2g/100 mL premix        2 g 200 mL/hr over 30 Minutes Intravenous To Short Stay 10/04/19 1225 10/05/19 1428   10/04/19 0000  vancomycin (VANCOREADY) IVPB 750 mg/150 mL     Discontinue     750 mg 150 mL/hr over 60 Minutes Intravenous Every 12 hours 10/03/19 1430     09/29/19 1200  vancomycin (VANCOCIN) IVPB 1000 mg/200 mL premix  Status:  Discontinued        1,000 mg 200 mL/hr over 60 Minutes Intravenous Every 12 hours 09/28/19 2353 10/03/19 1430   09/29/19 1000  cefTAZidime (FORTAZ) 2 g in sodium chloride 0.9 % 100 mL IVPB  Status:  Discontinued        2 g 200 mL/hr over 30 Minutes Intravenous Every 8 hours 09/29/19 0916 10/03/19 1352   09/29/19 0000  vancomycin (VANCOREADY) IVPB 1500 mg/300 mL        1,500 mg 150 mL/hr over 120 Minutes Intravenous NOW 09/28/19 2353 09/29/19 0259      Assessment/Plan: s/p Procedure(s): IRRIGATION AND DEBRIDEMENT OF STERNOCLAVICULAR JOINT-STERNUM AND RIBS ADJACENT TISSUE TRANSFER/TISSUE REARRANGEMENT, ABRA MUSCLE FLAP IRRIGATION AND DEBRIDEMENT STERNUM WOUND POSSIBLE PLACEMENT OF A CELL  He is doing well, no complaints.  -JP drain in place, continue to monitor output. No sign of seroma/hematoma formation. - Keep wound vac in place for 1 week, will re-evaluate  after 1 week post-op for need to remove or replace.   -Protein shakes to optimize healing.   LOS: 8 days    Leslee Home, PA-C 10/06/2019

## 2019-10-06 NOTE — Progress Notes (Addendum)
      [address]       [place] [address]             [phone]      1 Day Post-Op Procedure(s) (LRB): IRRIGATION AND DEBRIDEMENT OF STERNOCLAVICULAR JOINT-STERNUM AND RIBS (N/A) ADJACENT TISSUE TRANSFER/TISSUE REARRANGEMENT, ABRA MUSCLE FLAP (N/A) IRRIGATION AND DEBRIDEMENT STERNUM WOUND POSSIBLE PLACEMENT OF A CELL (N/A) Subjective: Feels okay this morning.   Objective: Vital signs in last 24 hours: Temp:  [97.7 F (36.5 C)-98 F (36.7 C)] 97.8 F (36.6 C) (07/14 2355) Pulse Rate:  [64-76] 65 (07/14 2355) Cardiac Rhythm: Normal sinus rhythm (07/15 0435) Resp:  [14-19] 18 (07/14 2355) BP: (89-122)/(57-72) 111/65 (07/14 2355) SpO2:  [92 %-100 %] 96 % (07/14 2355)     Intake/Output from previous day: 07/14 0701 - 07/15 0700 In: 1620 [P.O.:720; I.V.:800; IV Piggyback:100] Out: 2110 [Urine:1825; Drains:210; Blood:75] Intake/Output this shift: No intake/output data recorded.  General appearance: alert, cooperative and no distress Heart: regular rate and rhythm, S1, S2 normal, no murmur, click, rub or gallop Lungs: clear to auscultation bilaterally Abdomen: soft, non-tender; bowel sounds normal; no masses,  no organomegaly Extremities: extremities normal, atraumatic, no cyanosis or edema Wound: clean and dry with sterile dressing in place  Lab Results: Recent Labs    10/06/19 0235  WBC 13.8*  HGB 10.5*  HCT 32.9*  PLT 522*   BMET:  Recent Labs    10/06/19 0235  NA 134*  K 4.5  CL 99  CO2 26  GLUCOSE 138*  BUN 18  CREATININE 0.97  CALCIUM 8.8*    PT/INR: No results for input(s): LABPROT, INR in the last 72 hours. ABG    Component Value Date/Time   PHART 7.409 09/16/2019 1059   HCO3 22.2 09/16/2019 1059   TCO2 27 08/05/2019 1859   ACIDBASEDEF 1.8 09/16/2019 1059   O2SAT 98.1 09/16/2019 1059   CBG (last 3)  Recent Labs    10/05/19 1939 10/05/19 2354 10/06/19 0428  GLUCAP 178* 180* 166*    Assessment/Plan: S/P  Procedure(s) (LRB): IRRIGATION AND DEBRIDEMENT OF STERNOCLAVICULAR JOINT-STERNUM AND RIBS (N/A) ADJACENT TISSUE TRANSFER/TISSUE REARRANGEMENT, ABRA MUSCLE FLAP (N/A) IRRIGATION AND DEBRIDEMENT STERNUM WOUND POSSIBLE PLACEMENT OF A CELL (N/A)  1. CV-Remains in NSR in the 60s. BP well controlled. Continue Amiodarone 200mg  daily, Lopressor 12.5mg  BID, asa and statin. 2. Pulm-Continue to wean oxygen as able.Will make xopenex nebs available. 210cc out of the chest tube.  3. ID-on VancomycinStaph Epi on cultures. 4. CBGs with good control on current regimen 5. Continue wet-to-dry dressingdailyon LLE EVH site 6. Continue nicotine patch and chantix for smoking history  Plan: Consult placed to the transition of care team for assistance with living arrangements post discharge. Continue abx for 5 more days. Doing well overall.    LOS: 8 days    10/06/2019   Chart reviewed, patient examined, agree with above. He feels well. Pain well-controlled. Sitting up and eating. JP drainage low. VAC dressing intact. Will continue vancomycin for 7 days total. Encourage nutrition and ambulation.

## 2019-10-06 NOTE — Evaluation (Signed)
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  Pt underwent debridement of sternal wound and VAC placement on 09/30/19 by Dr. Laneta Simmers. On 10/05/19 pt underwent debridement, placement of Acell, and pectoralis muscle flap by Dr. Ulice Bold (plastics) and Dr. Laneta Simmers. PMH -  Cabg 08/05/19, reconstruction of sternum 09/20/19, copd with continued heavy smoking, etoh abuse, htn.   Clinical Impression  Pt presents to PT moving very well after surgery. Will see for 1-2 visits to reinforce sternal precautions. Pt is physically able to perform mobility and follow sternal precautions but unsure if he will follow through after DC.     Follow Up Recommendations No PT follow up    Equipment Recommendations  None recommended by PT    Recommendations for Other Services       Precautions / Restrictions Precautions Precautions: Sternal;Other (comment) Precaution Comments: pt with rt pectoralis muscle flap      Mobility  Bed Mobility Overal bed mobility: Modified Independent             General bed mobility comments: Able to come to sitting without use of UE's  Transfers Overall transfer level: Needs assistance Equipment used: None Transfers: Sit to/from Stand Sit to Stand: Supervision         General transfer comment: Pt able to come to stand without use of UE's, Needed cues on initial stand not to use UE's  Ambulation/Gait Ambulation/Gait assistance: Supervision Gait Distance (Feet): 800 Feet Assistive device: None Gait Pattern/deviations: Step-through pattern;Decreased stride length   Gait velocity interpretation: >4.37 ft/sec, indicative of normal walking speed General Gait Details: supervision for lines/tubes and safety  Stairs            Wheelchair Mobility    Modified Rankin (Stroke Patients Only)        Balance Overall balance assessment: No apparent balance deficits (not formally assessed)                                           Pertinent Vitals/Pain Pain Assessment: Faces Faces Pain Scale: Hurts a little bit Pain Location: rt axilla Pain Descriptors / Indicators: Sore Pain Intervention(s): Monitored during session    Home Living Family/patient expects to be discharged to:: Unsure                 Additional Comments: Has rolling walker and 3-in-1    Prior Function Level of Independence: Independent               Hand Dominance        Extremity/Trunk Assessment   Upper Extremity Assessment Upper Extremity Assessment: RUE deficits/detail;LUE deficits/detail RUE Deficits / Details: limited by muscle flap and sternal precautions LUE Deficits / Details: limited by muscle flap and sternal precautions    Lower Extremity Assessment Lower Extremity Assessment: Overall WFL for tasks assessed       Communication   Communication: No difficulties  Cognition Arousal/Alertness: Awake/alert Behavior During Therapy: WFL for tasks assessed/performed Overall Cognitive Status: Within Functional Limits for tasks assessed                                 General Comments: reported noncompliance with sternal precautions and medical  recommendations      General Comments General comments (skin integrity, edema, etc.): VSS on RA    Exercises     Assessment/Plan    PT Assessment Patient needs continued PT services (very briefly)  PT Problem List Decreased mobility;Decreased knowledge of precautions       PT Treatment Interventions Gait training;Patient/family education;Therapeutic activities    PT Goals (Current goals can be found in the Care Plan section)  Acute Rehab PT Goals Patient Stated Goal: not stated PT Goal Formulation: With patient Time For Goal Achievement: 10/13/19 Potential to Achieve Goals: Good     Frequency Min 2X/week   Barriers to discharge        Co-evaluation               AM-PAC PT "6 Clicks" Mobility  Outcome Measure Help needed turning from your back to your side while in a flat bed without using bedrails?: None Help needed moving from lying on your back to sitting on the side of a flat bed without using bedrails?: None Help needed moving to and from a bed to a chair (including a wheelchair)?: None Help needed standing up from a chair using your arms (e.g., wheelchair or bedside chair)?: None Help needed to walk in hospital room?: A Little Help needed climbing 3-5 steps with a railing? : A Little 6 Click Score: 22    End of Session   Activity Tolerance: Patient tolerated treatment well Patient left: in chair;with call bell/phone within reach Nurse Communication: Mobility status PT Visit Diagnosis: Other abnormalities of gait and mobility (R26.89)    Time: 1031-5945 PT Time Calculation (min) (ACUTE ONLY): 19 min   Charges:   PT Evaluation $PT Eval Low Complexity: 1 Low          [facility] PT Acute Rehabilitation Services Pager [phone] Office [phone]   Angelina Ok [facility] 10/06/2019, 3:57 PM

## 2019-10-06 NOTE — Plan of Care (Signed)
  Problem: Education: Goal: Knowledge of General Education information will improve Description: Including pain rating scale, medication(s)/side effects and non-pharmacologic comfort measures Outcome: Progressing   Problem: Health Behavior/Discharge Planning: Goal: Ability to manage health-related needs will improve Outcome: Progressing   Problem: Clinical Measurements: Goal: Will remain free from infection Outcome: Progressing Goal: Diagnostic test results will improve Outcome: Progressing Goal: Respiratory complications will improve Outcome: Progressing   Problem: Nutrition: Goal: Adequate nutrition will be maintained Outcome: Progressing   Problem: Coping: Goal: Level of anxiety will decrease Outcome: Progressing

## 2019-10-06 NOTE — TOC Initial Note (Addendum)
Transition of Care [facility]) - Initial/Assessment Note    Patient Details  Name: Matthew [name] MRN: [id number] Date of Birth: 1955/05/29  Transition of Care [facility]) CM/SW Contact:    Mearl Latin, LCSW Phone Number: 10/06/2019, 4:00 PM  Clinical Narrative:                 CSW received consult regarding patient being homeless. CSW spoke with patient. He reported that he currently resides with his ex-wife, sons, and their children. Patient reports that the situation is too stressful and would like to live on his own. He has been unable to find a place thus far and sometimes stays with a friend, but does not like to burden her. Patient receives disability monthly but does not have any money currently. Patient reported that he has home health through [facility]. CSW spoke with [facility] and they reported visits were cancelled due to multiple unsuccessful attempts at seeing the patient. They are unable to accept the patient back. CSW provided patient with a list of apartments and housing resources. Patient reported that he will likely be unable to get Section 8 due to having a legal history. CSW offered a shelter list as well but patient reported that he would not "do well" at a shelter and has tried [facility] before. CSW confirmed his PCP and address where home health sees him. Patient reports he will also think about going to a hotel but is aware his disability check will not cover very many days. Per PT, no follow up recommended currently. Will continue to follow for wound vac needs.     Expected Discharge Plan: Home w Home Health Services Barriers to Discharge: Continued Medical Work up, Family Issues   Patient Goals and CMS Choice Patient states their goals for this hospitalization and ongoing recovery are:: Establish new housing      Expected Discharge Plan and Services Expected Discharge Plan: Home w Home Health Services In-house Referral: Clinical Social Work      Living arrangements for the past 2 months: Mobile Home                                      Prior Living Arrangements/Services Living arrangements for the past 2 months: Mobile Home Lives with:: Other (Comment) (Ex-spouse and sons) Patient language and need for interpreter reviewed:: Yes Do you feel safe going back to the place where you live?: No   Patient states too much stress at home  Need for Family Participation in Patient Care: No (Comment) Care giver support system in place?: No (comment) Current home services: DME Criminal Activity/Legal Involvement Pertinent to Current Situation/Hospitalization: No - Comment as needed  Activities of Daily Living Home Assistive Devices/Equipment: None ADL Screening (condition at time of admission) Patient's cognitive ability adequate to safely complete daily activities?: Yes Is the patient deaf or have difficulty hearing?: No Does the patient have difficulty seeing, even when wearing glasses/contacts?: No Does the patient have difficulty concentrating, remembering, or making decisions?: No Patient able to express need for assistance with ADLs?: Yes Does the patient have difficulty dressing or bathing?: No Independently performs ADLs?: Yes (appropriate for developmental age) Does the patient have difficulty walking or climbing stairs?: No Weakness of Legs: None Weakness of Arms/Hands: None  Permission Sought/Granted Permission sought to share information with : Facility Industrial/product designer granted to share information with : Yes, Verbal Permission  Granted     Permission granted to share info w AGENCY: Home Health/Housing assistance        Emotional Assessment Appearance:: Appears stated age Attitude/Demeanor/Rapport: Engaged Affect (typically observed): Accepting, Appropriate Orientation: : Oriented to Self, Oriented to Place, Oriented to  Time, Oriented to Situation Alcohol / Substance Use: Not  Applicable Psych Involvement: No (comment)  Admission diagnosis:  Sternal wound infection [S21.101A, L08.9] Patient Active Problem List   Diagnosis Date Noted  . Sternal wound infection 09/28/2019  . Sternal pain 09/20/2019  . 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 Long term (current) use of anticoagulants 08/19/2019  . Paroxysmal atrial fibrillation (HCC)   . 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 Coronary artery disease 08/05/2019  . CAD S/P percutaneous coronary angioplasty of 100% OM1 08/04/2019  . Ischemic cardiomyopathy 08/04/2019  . Essential hypertension 08/04/2019  . History of stroke 08/04/2019  . Heavy smoker 1 PPD since age 39 08/04/2019  . Hyperlipidemia with target LDL less than 70 08/04/2019  . NSTEMI (non-ST elevated myocardial infarction) (HCC) 08/03/2019  . Multivessel coronary artery disease involving native coronary artery of native heart with unstable angina pectoris (HCC) 08/03/2019   PCP:  Juel Burrow, NP Pharmacy:   [facility] - [place], [place] - [address] [place] [address] Phone: [phone] Fax: [phone]  [facility] [place], [place] - [address] [place] [address] Phone: [phone] Fax: [phone]  [facility] - [address], [place] - [address] [place] [address] Phone: [phone] Fax: [phone]     Social Determinants of Health (SDOH) Interventions    Readmission Risk Interventions Readmission Risk Prevention Plan 10/06/2019 09/22/2019 08/16/2019  Post Dischage Appt - Complete -  Medication Screening - Complete -  Transportation Screening Complete Complete Complete  PCP or Specialist Appt within 5-7 Days Complete - Complete  Home Care Screening Complete - Complete  Medication Review (RN CM) Referral to Pharmacy - Complete

## 2019-10-06 NOTE — Anesthesia Postprocedure Evaluation (Signed)
Anesthesia Post Note  Patient: Matthew [name]  Procedure(s) Performed: IRRIGATION AND DEBRIDEMENT OF STERNOCLAVICULAR JOINT-STERNUM AND RIBS (N/A Chest) ADJACENT TISSUE TRANSFER/TISSUE REARRANGEMENT, ABRA MUSCLE FLAP (N/A ) IRRIGATION AND DEBRIDEMENT STERNUM WOUND POSSIBLE PLACEMENT OF A CELL (N/A )     Patient location during evaluation: PACU Anesthesia Type: General Level of consciousness: awake and alert Pain management: pain level controlled Vital Signs Assessment: post-procedure vital signs reviewed and stable Respiratory status: spontaneous breathing, nonlabored ventilation, respiratory function stable and patient connected to nasal cannula oxygen Cardiovascular status: blood pressure returned to baseline and stable Postop Assessment: no apparent nausea or vomiting Anesthetic complications: no   No complications documented.  Last Vitals:  Vitals:   10/06/19 0805 10/06/19 0835  BP: 108/65   Pulse: (!) 57 65  Resp: 18   Temp: 36.4 C   SpO2: 99%     Last Pain:  Vitals:   10/06/19 0805  TempSrc: Oral  PainSc:                  [name]

## 2019-10-06 NOTE — Progress Notes (Signed)
Pharmacy Antibiotic Note  Matthew [name] is a 64 y.o. male admitted on 09/28/2019 with wound infection.  Pharmacy has been consulted for vancomycin.   WBC 13, afebrile, Cr 0.9 crcl 57ml/min. Appears patient missed dose of vancomycin d/t trip to OR yesterday, will plan on rechecking trough in am. Last trough on 7/12 was just above goal at 21.    Plan: Continue vancomycin 750mg  IV q12h   - Will f/u renal function for dose adjustments as necessary in am  Height: 5\' 6"  (167.6 cm) Weight: 88 kg (194 lb) IBW/kg (Calculated) : 63.8  Temp (24hrs), Avg:97.8 F (36.6 C), Min:97.6 F (36.4 C), Max:98 F (36.7 C)  Recent Labs  Lab 10/01/19 0217 10/03/19 1245 10/06/19 0235  WBC 8.8  --  13.8*  CREATININE 0.90  --  0.97  VANCOTROUGH  --  21*  --     Estimated Creatinine Clearance: 81 mL/min (by C-G formula based on SCr of 0.97 mg/dL).    No Known Allergies  Antimicrobials this admission: Vanc 7/7>>> 12/04/19 7/8>>> 7/12   Microbiology results: 7/8&9 wound Cx staph epi S vanc  7/7 MRSA PCR: NEG  9/12 PharmD., BCPS Clinical Pharmacist 10/06/2019 9:13 AM

## 2019-10-07 LAB — VANCOMYCIN, TROUGH: Vancomycin Tr: 12 ug/mL — ABNORMAL LOW (ref 15–20)

## 2019-10-07 LAB — GLUCOSE, CAPILLARY
Glucose-Capillary: 100 mg/dL — ABNORMAL HIGH (ref 70–99)
Glucose-Capillary: 101 mg/dL — ABNORMAL HIGH (ref 70–99)
Glucose-Capillary: 108 mg/dL — ABNORMAL HIGH (ref 70–99)
Glucose-Capillary: 123 mg/dL — ABNORMAL HIGH (ref 70–99)
Glucose-Capillary: 126 mg/dL — ABNORMAL HIGH (ref 70–99)
Glucose-Capillary: 88 mg/dL (ref 70–99)
Glucose-Capillary: 95 mg/dL (ref 70–99)

## 2019-10-07 NOTE — Progress Notes (Signed)
Pharmacy Antibiotic Note  Matthew [name] is a 64 y.o. male admitted on 09/28/2019 with wound infection.  Pharmacy has been consulted for vancomycin.   WBC stable at 13, afebrile, Cr 0.9 crcl 33ml/min. Appears patient missed dose of vancomycin d/t trip to OR 7/14.   Previous vancomycin trough of 21, trough this am on lower dose was within goal at 12. (Goal 10-20) Renal function stable.    Plan: Continue vancomycin 750mg  IV q12h   - Will f/u renal function for dose adjustments as necessary in am  Height: 5\' 6"  (167.6 cm) Weight: 88 kg (194 lb) IBW/kg (Calculated) : 63.8  Temp (24hrs), Avg:97.9 F (36.6 C), Min:97.4 F (36.3 C), Max:98.2 F (36.8 C)  Recent Labs  Lab 10/01/19 0217 10/03/19 1245 10/06/19 0235 10/07/19 1007  WBC 8.8  --  13.8*  --   CREATININE 0.90  --  0.97  --   VANCOTROUGH  --  21*  --  12*    Estimated Creatinine Clearance: 81 mL/min (by C-G formula based on SCr of 0.97 mg/dL).    No Known Allergies  Antimicrobials this admission: Vanc 7/7>>> 10/08/19 7/8>>> 7/12   Microbiology results: 7/8&9 wound Cx staph epi S vanc  7/7 MRSA PCR: NEG  9/12 PharmD., BCPS Clinical Pharmacist 10/07/2019 11:17 AM

## 2019-10-07 NOTE — Progress Notes (Addendum)
      [address]       [place] [address]             [phone]      2 Days Post-Op Procedure(s) (LRB): IRRIGATION AND DEBRIDEMENT OF STERNOCLAVICULAR JOINT-STERNUM AND RIBS (N/A) ADJACENT TISSUE TRANSFER/TISSUE REARRANGEMENT, ABRA MUSCLE FLAP (N/A) IRRIGATION AND DEBRIDEMENT STERNUM WOUND POSSIBLE PLACEMENT OF A CELL (N/A) Subjective: Feeling okay this morning. He is having some left-sided rib pain with deep breathing.   Objective: Vital signs in last 24 hours: Temp:  [97.4 F (36.3 C)-98.2 F (36.8 C)] 97.8 F (36.6 C) (07/16 0734) Pulse Rate:  [57-68] 60 (07/16 0734) Cardiac Rhythm: Sinus bradycardia (07/16 0700) Resp:  [14-18] 14 (07/16 0734) BP: (103-111)/(55-70) 103/55 (07/16 0734) SpO2:  [94 %-100 %] 100 % (07/16 0734)     Intake/Output from previous day: 07/15 0701 - 07/16 0700 In: -  Out: 2130 [Urine:1975; Drains:155] Intake/Output this shift: Total I/O In: -  Out: 60 [Drains:60]  General appearance: alert, cooperative and no distress Heart: regular rate and rhythm, S1, S2 normal, no murmur, click, rub or gallop Lungs: clear to auscultation bilaterally Abdomen: soft, non-tender; bowel sounds normal; no masses,  no organomegaly Extremities: extremities normal, atraumatic, no cyanosis or edema Wound: clean and dry dressed with sterile vac  Lab Results: Recent Labs    10/06/19 0235  WBC 13.8*  HGB 10.5*  HCT 32.9*  PLT 522*   BMET:  Recent Labs    10/06/19 0235  NA 134*  K 4.5  CL 99  CO2 26  GLUCOSE 138*  BUN 18  CREATININE 0.97  CALCIUM 8.8*    PT/INR: No results for input(s): LABPROT, INR in the last 72 hours. ABG    Component Value Date/Time   PHART 7.409 09/16/2019 1059   HCO3 22.2 09/16/2019 1059   TCO2 27 08/05/2019 1859   ACIDBASEDEF 1.8 09/16/2019 1059   O2SAT 98.1 09/16/2019 1059   CBG (last 3)  Recent Labs    10/06/19 1938 10/06/19 2352 10/07/19 0403  GLUCAP 123* 126* 101*     Assessment/Plan: S/P Procedure(s) (LRB): IRRIGATION AND DEBRIDEMENT OF STERNOCLAVICULAR JOINT-STERNUM AND RIBS (N/A) ADJACENT TISSUE TRANSFER/TISSUE REARRANGEMENT, ABRA MUSCLE FLAP (N/A) IRRIGATION AND DEBRIDEMENT STERNUM WOUND POSSIBLE PLACEMENT OF A CELL (N/A)  1. CV-Remains in NSR in the 60s. BP well controlled. Continue Amiodarone 200mg  daily, Lopressor 12.5mg  BID, asa and statin. 2. Pulm-Continue to wean oxygen as able.Will make xopenex nebs available. Chest tube drainage is decreasing. 3. ID-on VancomycinStaph Epi on cultures. 4. CBGs with good control on current regimen 5. Continue wet-to-dry dressingdailyon LLE EVH site 6. Continue nicotine patch and chantix for smoking history  Plan: Continue post-op care. Continue Vancomycin. Keep sternal vac on until 7/21 per plastics. Continue ambulation in the halls. No recent fevers. Will order labs and CXR for the morning.    LOS: 9 days    8/21 10/07/2019   Chart reviewed, patient examined, agree with above. He feels well. VAC in place with good seal. I would treat him with vanc for 7 days total which would be until 7/20..  I think he will need to stay in the hospital at least a week until [facility] removed and possibly longer. He is homeless and transient and has lived with friends and his ex-wife before. I think if he is discharged he will end up with a broken down wound.

## 2019-10-07 NOTE — Plan of Care (Signed)
°  Problem: Education: °Goal: Knowledge of General Education information will improve °Description: Including pain rating scale, medication(s)/side effects and non-pharmacologic comfort measures °Outcome: Progressing °  °Problem: Clinical Measurements: °Goal: Ability to maintain clinical measurements within normal limits will improve °Outcome: Progressing °Goal: Will remain free from infection °Outcome: Progressing °Goal: Respiratory complications will improve °Outcome: Progressing °Goal: Cardiovascular complication will be avoided °Outcome: Progressing °  °Problem: Nutrition: °Goal: Adequate nutrition will be maintained °Outcome: Progressing °  °

## 2019-10-07 NOTE — Consult Note (Addendum)
WOC follow-up: Pt had surgery on Wed; Plastic surgery now following for assessment and plan of care. Orders provided for staff nurses to leave Vac dressing in place over postop location for flap and Acell for 1 week.  Please refer to Plastic Surgery team for further questions regarding plan of care.  Please re-consult if further assistance is needed.  Thank-you,  Cammie Mcgee MSN, RN, CWOCN, [place], CNS [phone]

## 2019-10-08 ENCOUNTER — Other Ambulatory Visit: Payer: Self-pay | Admitting: Surgery

## 2019-10-08 ENCOUNTER — Inpatient Hospital Stay (HOSPITAL_COMMUNITY): Payer: Medicare Other

## 2019-10-08 LAB — BASIC METABOLIC PANEL
Anion gap: 10 (ref 5–15)
BUN: 23 mg/dL (ref 8–23)
CO2: 22 mmol/L (ref 22–32)
Calcium: 8.9 mg/dL (ref 8.9–10.3)
Chloride: 101 mmol/L (ref 98–111)
Creatinine, Ser: 0.79 mg/dL (ref 0.61–1.24)
GFR calc Af Amer: >60 mL/min (ref >60)
GFR calc non Af Amer: >60 mL/min (ref >60)
Glucose, Bld: 95 mg/dL (ref 70–99)
Potassium: 4.7 mmol/L (ref 3.5–5.1)
Sodium: 133 mmol/L — ABNORMAL LOW (ref 135–145)

## 2019-10-08 LAB — GLUCOSE, CAPILLARY
Glucose-Capillary: 104 mg/dL — ABNORMAL HIGH (ref 70–99)
Glucose-Capillary: 118 mg/dL — ABNORMAL HIGH (ref 70–99)
Glucose-Capillary: 123 mg/dL — ABNORMAL HIGH (ref 70–99)
Glucose-Capillary: 86 mg/dL (ref 70–99)
Glucose-Capillary: 90 mg/dL (ref 70–99)
Glucose-Capillary: 97 mg/dL (ref 70–99)

## 2019-10-08 LAB — CBC
HCT: 33.5 % — ABNORMAL LOW (ref 39.0–52.0)
Hemoglobin: 10.9 g/dL — ABNORMAL LOW (ref 13.0–17.0)
MCH: 28.9 pg (ref 26.0–34.0)
MCHC: 32.5 g/dL (ref 30.0–36.0)
MCV: 88.9 fL (ref 80.0–100.0)
Platelets: 427 10*3/uL — ABNORMAL HIGH (ref 150–400)
RBC: 3.77 MIL/uL — ABNORMAL LOW (ref 4.22–5.81)
RDW: 13.9 % (ref 11.5–15.5)
WBC: 10.7 10*3/uL — ABNORMAL HIGH (ref 4.0–10.5)
nRBC: 0 % (ref 0.0–0.2)

## 2019-10-08 NOTE — Progress Notes (Addendum)
[address]       [place] [address]             [phone]      3 Days Post-Op Procedure(s) (LRB): IRRIGATION AND DEBRIDEMENT OF STERNOCLAVICULAR JOINT-STERNUM AND RIBS (N/A) ADJACENT TISSUE TRANSFER/TISSUE REARRANGEMENT, ABRA MUSCLE FLAP (N/A) IRRIGATION AND DEBRIDEMENT STERNUM WOUND POSSIBLE PLACEMENT OF A CELL (N/A) Subjective: Feels okay today. Just had a bowel movement.   Objective: Vital signs in last 24 hours: Temp:  [97.3 F (36.3 C)-98.4 F (36.9 C)] 98.2 F (36.8 C) (07/17 1122) Pulse Rate:  [57-66] 60 (07/17 1122) Cardiac Rhythm: Normal sinus rhythm (07/17 0706) Resp:  [12-18] 13 (07/17 1122) BP: (90-118)/(57-65) 105/65 (07/17 1122) SpO2:  [95 %-99 %] 99 % (07/17 1122)     Intake/Output from previous day: 07/16 0701 - 07/17 0700 In: 240 [P.O.:240] Out: 1940 [Urine:1775; Drains:165] Intake/Output this shift: Total I/O In: 480 [P.O.:480] Out: 2000 [Urine:2000]  General appearance: alert, cooperative and no distress Heart: regular rate and rhythm, S1, S2 normal, no murmur, click, rub or gallop Lungs: clear to auscultation bilaterally and left sided click probably from rib bone fractues Abdomen: soft, non-tender; bowel sounds normal; no masses,  no organomegaly Extremities: extremities normal, atraumatic, no cyanosis or edema Wound: clean and dry dressed with a superficial vac  Lab Results: Recent Labs    10/06/19 0235 10/08/19 0401  WBC 13.8* 10.7*  HGB 10.5* 10.9*  HCT 32.9* 33.5*  PLT 522* 427*   BMET:  Recent Labs    10/06/19 0235 10/08/19 0401  NA 134* 133*  K 4.5 4.7  CL 99 101  CO2 26 22  GLUCOSE 138* 95  BUN 18 23  CREATININE 0.97 0.79  CALCIUM 8.8* 8.9    PT/INR: No results for input(s): LABPROT, INR in the last 72 hours. ABG    Component Value Date/Time   PHART 7.409 09/16/2019 1059   HCO3 22.2 09/16/2019 1059   TCO2 27 08/05/2019 1859   ACIDBASEDEF 1.8 09/16/2019 1059   O2SAT 98.1 09/16/2019 1059     CBG (last 3)  Recent Labs    10/08/19 0400 10/08/19 0806 10/08/19 1120  GLUCAP 90 123* 104*    Assessment/Plan: S/P Procedure(s) (LRB): IRRIGATION AND DEBRIDEMENT OF STERNOCLAVICULAR JOINT-STERNUM AND RIBS (N/A) ADJACENT TISSUE TRANSFER/TISSUE REARRANGEMENT, ABRA MUSCLE FLAP (N/A) IRRIGATION AND DEBRIDEMENT STERNUM WOUND POSSIBLE PLACEMENT OF A CELL (N/A)  1. CV-Remains in NSR in the 60s. BP well controlled. Continue Amiodarone 200mg  daily, Lopressor 12.5mg  BID, asa and statin. 2. Pulm-Continue to wean oxygen as able.Will make xopenex nebs available.Continue chest drain. CXR showed atelectasis.  3. ID-on VancomycinStaph Epi on cultures. 4. CBGs with good control on current regimen 5. Continue wet-to-dry dressingdailyon LLE EVH site 6. Continue nicotine patch and chantix for smoking history  Plan: Continue post-op care. Continue Vancomycin until 7/20. Keep sternal vac on until 7/21 per plastics. Continue ambulation in the halls. No recent fevers. Will order labs and CXR for the morning. Social work/case management working on discharge plan since the patient is homeless. He has an x-wife he could potentially stay with but he is still deciding if he wants to do that.    LOS: 10 days    8/21 10/08/2019   I have seen and examined the patient and agree with the assessment and plan as outlined.  Patient will need to stay in the hospital at least until next [facility] change and drain removal.  [facility],  MD 10/08/2019 1:40 PM

## 2019-10-08 NOTE — Plan of Care (Signed)
  Problem: Education: Goal: Knowledge of General Education information will improve Description: Including pain rating scale, medication(s)/side effects and non-pharmacologic comfort measures Outcome: Progressing   Problem: Health Behavior/Discharge Planning: Goal: Ability to manage health-related needs will improve Outcome: Progressing   Problem: Clinical Measurements: Goal: Ability to maintain clinical measurements within normal limits will improve Outcome: Progressing   Problem: Clinical Measurements: Goal: Diagnostic test results will improve Outcome: Progressing   Problem: Clinical Measurements: Goal: Cardiovascular complication will be avoided Outcome: Progressing   Problem: Activity: Goal: Risk for activity intolerance will decrease Outcome: Progressing   Problem: Nutrition: Goal: Adequate nutrition will be maintained Outcome: Progressing   Problem: Pain Managment: Goal: General experience of comfort will improve Outcome: Progressing   Problem: Skin Integrity: Goal: Risk for impaired skin integrity will decrease Outcome: Progressing   

## 2019-10-09 LAB — GLUCOSE, CAPILLARY
Glucose-Capillary: 100 mg/dL — ABNORMAL HIGH (ref 70–99)
Glucose-Capillary: 102 mg/dL — ABNORMAL HIGH (ref 70–99)
Glucose-Capillary: 128 mg/dL — ABNORMAL HIGH (ref 70–99)
Glucose-Capillary: 84 mg/dL (ref 70–99)
Glucose-Capillary: 96 mg/dL (ref 70–99)
Glucose-Capillary: 99 mg/dL (ref 70–99)

## 2019-10-09 MED ORDER — LEVOTHYROXINE SODIUM 100 MCG PO TABS
100.0000 ug | ORAL_TABLET | Freq: Every day | ORAL | Status: DC
  Administered 2019-10-10 – 2019-10-17 (×8): 100 ug via ORAL
  Filled 2019-10-09 (×8): qty 1

## 2019-10-09 NOTE — Progress Notes (Addendum)
Pt is ambulating in a hallway and in a room, denies CP and SOB, in RA, dressing change done left leg, site looks CDI, pt is running SB most of time in the monitor and on 50-60's other time, went to the lower 40's but non-sustained, MD aware, Wound vac suction continue, will continue to monitor the patient  Lonia Farber, RN

## 2019-10-09 NOTE — Plan of Care (Signed)
  Problem: Education: Goal: Knowledge of General Education information will improve Description: Including pain rating scale, medication(s)/side effects and non-pharmacologic comfort measures Outcome: Progressing   Problem: Health Behavior/Discharge Planning: Goal: Ability to manage health-related needs will improve Outcome: Progressing   Problem: Clinical Measurements: Goal: Ability to maintain clinical measurements within normal limits will improve Outcome: Progressing   Problem: Clinical Measurements: Goal: Will remain free from infection Outcome: Progressing   Problem: Clinical Measurements: Goal: Diagnostic test results will improve Outcome: Progressing   Problem: Clinical Measurements: Goal: Cardiovascular complication will be avoided Outcome: Progressing   Problem: Activity: Goal: Risk for activity intolerance will decrease Outcome: Progressing   Problem: Nutrition: Goal: Adequate nutrition will be maintained Outcome: Progressing   Problem: Pain Managment: Goal: General experience of comfort will improve Outcome: Progressing   Problem: Safety: Goal: Ability to remain free from injury will improve Outcome: Progressing   Problem: Skin Integrity: Goal: Risk for impaired skin integrity will decrease Outcome: Progressing

## 2019-10-09 NOTE — Progress Notes (Addendum)
[address]       [place] [address]             [phone]      4 Days Post-Op Procedure(s) (LRB): IRRIGATION AND DEBRIDEMENT OF STERNOCLAVICULAR JOINT-STERNUM AND RIBS (N/A) ADJACENT TISSUE TRANSFER/TISSUE REARRANGEMENT, ABRA MUSCLE FLAP (N/A) IRRIGATION AND DEBRIDEMENT STERNUM WOUND POSSIBLE PLACEMENT OF A CELL (N/A) Subjective: Feels well, pain control is pretty good  Objective: Vital signs in last 24 hours: Temp:  [97.4 F (36.3 C)-98.3 F (36.8 C)] 97.4 F (36.3 C) (07/18 1507) Pulse Rate:  [56-68] 60 (07/18 1507) Cardiac Rhythm: Sinus bradycardia (07/18 0746) Resp:  [12-19] 18 (07/18 1507) BP: (106-113)/(60-69) 106/69 (07/18 1507) SpO2:  [94 %-96 %] 94 % (07/18 1507)  Hemodynamic parameters for last 24 hours:    Intake/Output from previous day: 07/17 0701 - 07/18 0700 In: 2103 [P.O.:1200; I.V.:3; IV Piggyback:900] Out: 4775 [Urine:4700; Drains:75] Intake/Output this shift: Total I/O In: 720 [P.O.:720] Out: 1451 [Urine:1420; Drains:30; Stool:1]  General appearance: alert, cooperative and no distress Heart: regular rate and rhythm Lungs: clear to auscultation bilaterally Abdomen: benign Extremities: no edema Wound: VAC in place no surrounding erethema, EVH site looks ok  Lab Results: Recent Labs    10/08/19 0401  WBC 10.7*  HGB 10.9*  HCT 33.5*  PLT 427*   BMET:  Recent Labs    10/08/19 0401  NA 133*  K 4.7  CL 101  CO2 22  GLUCOSE 95  BUN 23  CREATININE 0.79  CALCIUM 8.9    PT/INR: No results for input(s): LABPROT, INR in the last 72 hours. ABG    Component Value Date/Time   PHART 7.409 09/16/2019 1059   HCO3 22.2 09/16/2019 1059   TCO2 27 08/05/2019 1859   ACIDBASEDEF 1.8 09/16/2019 1059   O2SAT 98.1 09/16/2019 1059   CBG (last 3)  Recent Labs    10/09/19 0815 10/09/19 1112 10/09/19 1559  GLUCAP 128* 84 96    Meds Scheduled Meds: . amiodarone  200 mg Oral Daily  . aspirin EC  81 mg Oral Daily  .  atorvastatin  80 mg Oral Daily  . insulin aspart  0-15 Units Subcutaneous Q4H  . levothyroxine  100 mcg Oral Q0600  . metoprolol tartrate  12.5 mg Oral BID  . nicotine  14 mg Transdermal Daily  . Ensure Max Protein  11 oz Oral Daily  . sodium chloride flush  3 mL Intravenous Q12H  . varenicline  1 mg Oral BID   Continuous Infusions: . vancomycin 750 mg (10/09/19 0909)   PRN Meds:.chlorpheniramine-HYDROcodone, levalbuterol, morphine injection, ondansetron **OR** [DISCONTINUED] ondansetron (ZOFRAN) IV, oxyCODONE, senna-docusate, traMADol  Xrays DG Chest 1 View  Result Date: 10/08/2019 CLINICAL DATA:  64 year old male with rib pain on the left EXAM: CHEST  1 VIEW COMPARISON:  09/30/2019 FINDINGS: Cardiomediastinal silhouette unchanged in size and contour. Interval removal of sternal wires. No pneumothorax. No pleural effusion. No new confluent airspace disease. Coarsened interstitial markings. IMPRESSION: Mild coarsened interstitial markings, potentially atelectasis/scarring. Interval removal of sternal wires. Electronically Signed   By: Gilmer Mor D.O.   On: 10/08/2019 09:16   Results for orders placed or performed during the hospital encounter of 09/28/19  MRSA PCR Screening     Status: None   Collection Time: 09/28/19 11:45 PM   Specimen: Nasopharyngeal  Result Value Ref Range Status   MRSA by PCR NEGATIVE NEGATIVE Final    Comment:        The  GeneXpert MRSA Assay (FDA approved for NASAL specimens only), is one component of a comprehensive MRSA colonization surveillance program. It is not intended to diagnose MRSA infection nor to guide or monitor treatment for MRSA infections. Performed at [facility], [address], [place], [place] [address]   Aerobic/Anaerobic Culture (surgical/deep wound)     Status: None   Collection Time: 09/29/19  8:30 AM   Specimen: Wound  Result Value Ref Range Status   Specimen Description WOUND STERNUM  Final   Special Requests NONE   Final   Gram Stain NO WBC SEEN NO ORGANISMS SEEN   Final   Culture   Final    RARE STAPHYLOCOCCUS EPIDERMIDIS NO ANAEROBES ISOLATED Performed at [facility], [address], [place], [place] [address]    Report Status 10/04/2019 FINAL  Final   Organism ID, Bacteria STAPHYLOCOCCUS EPIDERMIDIS  Final      Susceptibility   Staphylococcus epidermidis - MIC*    CIPROFLOXACIN >=8 RESISTANT Resistant     ERYTHROMYCIN >=8 RESISTANT Resistant     GENTAMICIN <=0.5 SENSITIVE Sensitive     OXACILLIN >=4 RESISTANT Resistant     TETRACYCLINE 2 SENSITIVE Sensitive     VANCOMYCIN 1 SENSITIVE Sensitive     TRIMETH/SULFA 80 RESISTANT Resistant     CLINDAMYCIN >=8 RESISTANT Resistant     RIFAMPIN <=0.5 SENSITIVE Sensitive     Inducible Clindamycin NEGATIVE Sensitive     * RARE STAPHYLOCOCCUS EPIDERMIDIS  Aerobic/Anaerobic Culture (surgical/deep wound)     Status: None   Collection Time: 09/30/19  3:10 PM   Specimen: Other Source; Body Fluid  Result Value Ref Range Status   Specimen Description WOUND STERNUM  Final   Special Requests PT ON FORTAZ VANC  Final   Gram Stain   Final    FEW WBC PRESENT, PREDOMINANTLY PMN NO ORGANISMS SEEN    Culture   Final    RARE STAPHYLOCOCCUS EPIDERMIDIS NO ANAEROBES ISOLATED Performed at [facility], [address] [place], [place] [address]    Report Status 10/05/2019 FINAL  Final   Organism ID, Bacteria STAPHYLOCOCCUS EPIDERMIDIS  Final      Susceptibility   Staphylococcus epidermidis - MIC*    CIPROFLOXACIN >=8 RESISTANT Resistant     ERYTHROMYCIN >=8 RESISTANT Resistant     GENTAMICIN <=0.5 SENSITIVE Sensitive     OXACILLIN >=4 RESISTANT Resistant     TETRACYCLINE 2 SENSITIVE Sensitive     VANCOMYCIN 1 SENSITIVE Sensitive     TRIMETH/SULFA 160 RESISTANT Resistant     CLINDAMYCIN >=8 RESISTANT Resistant     RIFAMPIN <=0.5 SENSITIVE Sensitive     Inducible Clindamycin NEGATIVE Sensitive     * RARE STAPHYLOCOCCUS EPIDERMIDIS     Assessment/Plan: S/P Procedure(s) (LRB): IRRIGATION AND DEBRIDEMENT OF STERNOCLAVICULAR JOINT-STERNUM AND RIBS (N/A) ADJACENT TISSUE TRANSFER/TISSUE REARRANGEMENT, ABRA MUSCLE FLAP (N/A) IRRIGATION AND DEBRIDEMENT STERNUM WOUND POSSIBLE PLACEMENT OF A CELL (N/A)   1 remains pretty stable 2 VSS, brady at times, will stop amiodarone and lopressor- no recent afib or tachy-dysrhythmias. 3 will repeat labs in am 4 conts vanco as scheduled 5 conts VAC plan as noted, hopefully can get JP out soon  6 [facility] doing well with current Tx    LOS: 11 days    Rowe Clack PA-C Pager [phone] 10/09/2019   I have seen and examined the patient and agree with the assessment and plan as outlined.  Purcell Nails, MD 10/09/2019

## 2019-10-10 LAB — GLUCOSE, CAPILLARY
Glucose-Capillary: 110 mg/dL — ABNORMAL HIGH (ref 70–99)
Glucose-Capillary: 126 mg/dL — ABNORMAL HIGH (ref 70–99)
Glucose-Capillary: 76 mg/dL (ref 70–99)
Glucose-Capillary: 91 mg/dL (ref 70–99)
Glucose-Capillary: 94 mg/dL (ref 70–99)
Glucose-Capillary: 95 mg/dL (ref 70–99)

## 2019-10-10 LAB — BASIC METABOLIC PANEL
Anion gap: 9 (ref 5–15)
BUN: 26 mg/dL — ABNORMAL HIGH (ref 8–23)
CO2: 23 mmol/L (ref 22–32)
Calcium: 9 mg/dL (ref 8.9–10.3)
Chloride: 101 mmol/L (ref 98–111)
Creatinine, Ser: 0.87 mg/dL (ref 0.61–1.24)
GFR calc Af Amer: >60 mL/min (ref >60)
GFR calc non Af Amer: >60 mL/min (ref >60)
Glucose, Bld: 111 mg/dL — ABNORMAL HIGH (ref 70–99)
Potassium: 4.1 mmol/L (ref 3.5–5.1)
Sodium: 133 mmol/L — ABNORMAL LOW (ref 135–145)

## 2019-10-10 LAB — CBC
HCT: 31.7 % — ABNORMAL LOW (ref 39.0–52.0)
Hemoglobin: 10 g/dL — ABNORMAL LOW (ref 13.0–17.0)
MCH: 28.1 pg (ref 26.0–34.0)
MCHC: 31.5 g/dL (ref 30.0–36.0)
MCV: 89 fL (ref 80.0–100.0)
Platelets: 497 10*3/uL — ABNORMAL HIGH (ref 150–400)
RBC: 3.56 MIL/uL — ABNORMAL LOW (ref 4.22–5.81)
RDW: 13.8 % (ref 11.5–15.5)
WBC: 9.2 10*3/uL (ref 4.0–10.5)
nRBC: 0 % (ref 0.0–0.2)

## 2019-10-10 NOTE — Progress Notes (Signed)
Pharmacy Antibiotic Note  Matthew [name] is a 64 y.o. male admitted on 09/28/2019 with wound infection.  Pharmacy has been consulted for vancomycin.   Cr remains stable, planning to continue vancomycin through 7/20 in patient.   Plan: -Continue vancomycin 750mg  IV q12h     Height: 5\' 6"  (167.6 cm) Weight: 88 kg (194 lb) IBW/kg (Calculated) : 63.8  Temp (24hrs), Avg:97.7 F (36.5 C), Min:97.4 F (36.3 C), Max:98 F (36.7 C)  Recent Labs  Lab 10/03/19 1245 10/06/19 0235 10/07/19 1007 10/08/19 0401 10/10/19 0255  WBC  --  13.8*  --  10.7* 9.2  CREATININE  --  0.97  --  0.79 0.87  VANCOTROUGH 21*  --  12*  --   --     Estimated Creatinine Clearance: 90.3 mL/min (by C-G formula based on SCr of 0.87 mg/dL).    No Known Allergies  Antimicrobials this admission: Vanc 7/7>>> 10/10/19 7/8>>> 7/12   Microbiology results: 7/8&9 wound Cx staph epi S vanc  7/7 MRSA PCR: NEG  9/12, PharmD, BCPS Clinical Pharmacist [phone] Please check AMION for all [facility] Pharmacy numbers 10/10/2019

## 2019-10-10 NOTE — Plan of Care (Signed)

## 2019-10-10 NOTE — Progress Notes (Addendum)
[address]       [place] [address]             [phone]      5 Days Post-Op Procedure(s) (LRB): IRRIGATION AND DEBRIDEMENT OF STERNOCLAVICULAR JOINT-STERNUM AND RIBS (N/A) ADJACENT TISSUE TRANSFER/TISSUE REARRANGEMENT, ABRA MUSCLE FLAP (N/A) IRRIGATION AND DEBRIDEMENT STERNUM WOUND POSSIBLE PLACEMENT OF A CELL (N/A) Subjective: No new c/o  Objective: Vital signs in last 24 hours: Temp:  [97.4 F (36.3 C)-98 F (36.7 C)] 97.6 F (36.4 C) (07/19 0718) Pulse Rate:  [56-60] 58 (07/19 0342) Cardiac Rhythm: Normal sinus rhythm (07/19 0740) Resp:  [12-18] 13 (07/19 0718) BP: (106-123)/(60-73) 111/60 (07/19 0718) SpO2:  [91 %-100 %] 100 % (07/19 0342)  Hemodynamic parameters for last 24 hours:    Intake/Output from previous day: 07/18 0701 - 07/19 0700 In: 1503 [P.O.:1200; I.V.:3; IV Piggyback:300] Out: 2086 [Urine:2020; Drains:65; Stool:1] Intake/Output this shift: Total I/O In: 3 [I.V.:3] Out: 800 [Urine:800]  General appearance: alert, cooperative and no distress Wound: VAC in place , left leg EVH site healing well  Lab Results: Recent Labs    10/08/19 0401 10/10/19 0255  WBC 10.7* 9.2  HGB 10.9* 10.0*  HCT 33.5* 31.7*  PLT 427* 497*   BMET:  Recent Labs    10/08/19 0401 10/10/19 0255  NA 133* 133*  K 4.7 4.1  CL 101 101  CO2 22 23  GLUCOSE 95 111*  BUN 23 26*  CREATININE 0.79 0.87  CALCIUM 8.9 9.0    PT/INR: No results for input(s): LABPROT, INR in the last 72 hours. ABG    Component Value Date/Time   PHART 7.409 09/16/2019 1059   HCO3 22.2 09/16/2019 1059   TCO2 27 08/05/2019 1859   ACIDBASEDEF 1.8 09/16/2019 1059   O2SAT 98.1 09/16/2019 1059   CBG (last 3)  Recent Labs    10/10/19 0352 10/10/19 0750 10/10/19 1128  GLUCAP 110* 126* 76    Meds Scheduled Meds: . aspirin EC  81 mg Oral Daily  . atorvastatin  80 mg Oral Daily  . insulin aspart  0-15 Units Subcutaneous Q4H  . levothyroxine  100 mcg Oral  Q0600  . nicotine  14 mg Transdermal Daily  . Ensure Max Protein  11 oz Oral Daily  . sodium chloride flush  3 mL Intravenous Q12H  . varenicline  1 mg Oral BID   Continuous Infusions: . vancomycin 750 mg (10/10/19 0951)   PRN Meds:.chlorpheniramine-HYDROcodone, levalbuterol, morphine injection, ondansetron **OR** [DISCONTINUED] ondansetron (ZOFRAN) IV, oxyCODONE, senna-docusate, traMADol  Xrays No results found. Results for orders placed or performed during the hospital encounter of 09/28/19  MRSA PCR Screening     Status: None   Collection Time: 09/28/19 11:45 PM   Specimen: Nasopharyngeal  Result Value Ref Range Status   MRSA by PCR NEGATIVE NEGATIVE Final    Comment:        The GeneXpert MRSA Assay (FDA approved for NASAL specimens only), is one component of a comprehensive MRSA colonization surveillance program. It is not intended to diagnose MRSA infection nor to guide or monitor treatment for MRSA infections. Performed at [facility], [address], [place], [place] [address]   Aerobic/Anaerobic Culture (surgical/deep wound)     Status: None   Collection Time: 09/29/19  8:30 AM   Specimen: Wound  Result Value Ref Range Status   Specimen Description WOUND STERNUM  Final   Special Requests NONE  Final   Gram Stain  NO WBC SEEN NO ORGANISMS SEEN   Final   Culture   Final    RARE STAPHYLOCOCCUS EPIDERMIDIS NO ANAEROBES ISOLATED Performed at [facility], [address] [place], [place] [address]    Report Status 10/04/2019 FINAL  Final   Organism ID, Bacteria STAPHYLOCOCCUS EPIDERMIDIS  Final      Susceptibility   Staphylococcus epidermidis - MIC*    CIPROFLOXACIN >=8 RESISTANT Resistant     ERYTHROMYCIN >=8 RESISTANT Resistant     GENTAMICIN <=0.5 SENSITIVE Sensitive     OXACILLIN >=4 RESISTANT Resistant     TETRACYCLINE 2 SENSITIVE Sensitive     VANCOMYCIN 1 SENSITIVE Sensitive     TRIMETH/SULFA 80 RESISTANT Resistant     CLINDAMYCIN >=8  RESISTANT Resistant     RIFAMPIN <=0.5 SENSITIVE Sensitive     Inducible Clindamycin NEGATIVE Sensitive     * RARE STAPHYLOCOCCUS EPIDERMIDIS  Aerobic/Anaerobic Culture (surgical/deep wound)     Status: None   Collection Time: 09/30/19  3:10 PM   Specimen: Other Source; Body Fluid  Result Value Ref Range Status   Specimen Description WOUND STERNUM  Final   Special Requests PT ON FORTAZ VANC  Final   Gram Stain   Final    FEW WBC PRESENT, PREDOMINANTLY PMN NO ORGANISMS SEEN    Culture   Final    RARE STAPHYLOCOCCUS EPIDERMIDIS NO ANAEROBES ISOLATED Performed at [facility], [address] [place], [place] [address]    Report Status 10/05/2019 FINAL  Final   Organism ID, Bacteria STAPHYLOCOCCUS EPIDERMIDIS  Final      Susceptibility   Staphylococcus epidermidis - MIC*    CIPROFLOXACIN >=8 RESISTANT Resistant     ERYTHROMYCIN >=8 RESISTANT Resistant     GENTAMICIN <=0.5 SENSITIVE Sensitive     OXACILLIN >=4 RESISTANT Resistant     TETRACYCLINE 2 SENSITIVE Sensitive     VANCOMYCIN 1 SENSITIVE Sensitive     TRIMETH/SULFA 160 RESISTANT Resistant     CLINDAMYCIN >=8 RESISTANT Resistant     RIFAMPIN <=0.5 SENSITIVE Sensitive     Inducible Clindamycin NEGATIVE Sensitive     * RARE STAPHYLOCOCCUS EPIDERMIDIS   Assessment/Plan: S/P Procedure(s) (LRB): IRRIGATION AND DEBRIDEMENT OF STERNOCLAVICULAR JOINT-STERNUM AND RIBS (N/A) ADJACENT TISSUE TRANSFER/TISSUE REARRANGEMENT, ABRA MUSCLE FLAP (N/A) IRRIGATION AND DEBRIDEMENT STERNUM WOUND POSSIBLE PLACEMENT OF A CELL (N/A)  1 no fevers- conts Vanco, plan to stop tomorrow 2 hemodyn stable, some bradycardia- BB and amio stopped\ 3 sats good on RA 4 no leukocytosis 5 H/H slightly lower- monitor clinically 6 normal renal fxn 7 BS well controlled 8 VAC till 7/21 and re- assess 9 SW involved with homeless/social issues  LOS: 12 days    Rowe Clack PA-C Pager [phone] 10/10/2019   I have seen and examined the patient  and agree with the assessment and plan as outlined.  Purcell Nails, MD 10/10/2019 12:49 PM

## 2019-10-10 NOTE — Progress Notes (Signed)
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  Pt underwent debridement of sternal wound and VAC placement on 09/30/19 by Dr. Cyndia Bent. On 10/05/19 pt underwent debridement, placement of Acell, and pectoralis muscle flap by Dr. Marla Roe (plastics) and Dr. Cyndia Bent. PMH -  Cabg 08/05/19, reconstruction of sternum 09/20/19, copd with continued heavy smoking, etoh abuse, htn.     PT Comments    Pt independent with mobility including ambulation in halls. Pt able to perform and maintain his sternal precautions. Instructed pt to continue to amb in halls. He will need someone to make sure VAC and monitors are on IV pole so he can push this.  No further PT needed.  Follow Up Recommendations  No PT follow up     Equipment Recommendations  None recommended by PT    Recommendations for Other Services       Precautions / Restrictions Precautions Precautions: Sternal;Other (comment) Precaution Comments: pt with rt pectoralis muscle flap    Mobility  Bed Mobility Overal bed mobility: Modified Independent             General bed mobility comments: Able to come to sitting without use of UE's  Transfers Overall transfer level: Modified independent Equipment used: None Transfers: Sit to/from Stand Sit to Stand: Modified independent (Device/Increase time)         General transfer comment: repeated x 5 without use of hands  Ambulation/Gait Ambulation/Gait assistance: Modified independent (Device/Increase time) Gait Distance (Feet): 900 Feet Assistive device: None Gait Pattern/deviations: WFL(Within Functional Limits) Gait velocity: normal Gait velocity interpretation: >4.37 ft/sec, indicative of normal walking speed General Gait Details: Steady gait   Stairs             Wheelchair Mobility    Modified  [place] (Stroke Patients Only)       Balance Overall balance assessment: No apparent balance deficits (not formally assessed)                                          Cognition Arousal/Alertness: Awake/alert Behavior During Therapy: WFL for tasks assessed/performed Overall Cognitive Status: Within Functional Limits for tasks assessed                                 General Comments: reported noncompliance with sternal precautions and medical recommendations      Exercises      General Comments        Pertinent Vitals/Pain Pain Assessment: No/denies pain    Home Living                      Prior Function            PT Goals (current goals can now be found in the care plan section) Acute Rehab PT Goals Patient Stated Goal: not stated Progress towards PT goals: Goals met/education completed, patient discharged from PT    Frequency           PT Plan      Co-evaluation              AM-PAC PT "6 Clicks" Mobility   Outcome Measure  Help needed turning from your back to  your side while in a flat bed without using bedrails?: None Help needed moving from lying on your back to sitting on the side of a flat bed without using bedrails?: None Help needed moving to and from a bed to a chair (including a wheelchair)?: None Help needed standing up from a chair using your arms (e.g., wheelchair or bedside chair)?: None Help needed to walk in hospital room?: None Help needed climbing 3-5 steps with a railing? : None 6 Click Score: 24    End of Session   Activity Tolerance: Patient tolerated treatment well Patient left: with call bell/phone within reach;in bed Nurse Communication: Mobility status PT Visit Diagnosis: Other abnormalities of gait and mobility (R26.89)     Time: 3014-9969 PT Time Calculation (min) (ACUTE ONLY): 13 min  Charges:  $Gait Training: 8-22 mins                     [place] Pager [phone] [place] 10/10/2019, 4:09 PM

## 2019-10-11 LAB — GLUCOSE, CAPILLARY
Glucose-Capillary: 102 mg/dL — ABNORMAL HIGH (ref 70–99)
Glucose-Capillary: 106 mg/dL — ABNORMAL HIGH (ref 70–99)
Glucose-Capillary: 107 mg/dL — ABNORMAL HIGH (ref 70–99)
Glucose-Capillary: 72 mg/dL (ref 70–99)
Glucose-Capillary: 95 mg/dL (ref 70–99)
Glucose-Capillary: 96 mg/dL (ref 70–99)

## 2019-10-11 NOTE — Plan of Care (Signed)

## 2019-10-11 NOTE — Progress Notes (Addendum)
      [address]       [place] [address]             [phone]      6 Days Post-Op Procedure(s) (LRB): IRRIGATION AND DEBRIDEMENT OF STERNOCLAVICULAR JOINT-STERNUM AND RIBS (N/A) ADJACENT TISSUE TRANSFER/TISSUE REARRANGEMENT, ABRA MUSCLE FLAP (N/A) IRRIGATION AND DEBRIDEMENT STERNUM WOUND POSSIBLE PLACEMENT OF A CELL (N/A) Subjective: Feels good this morning. Pain is well controlled.   Objective: Vital signs in last 24 hours: Temp:  [97.6 F (36.4 C)-98.3 F (36.8 C)] 98 F (36.7 C) (07/20 0716) Pulse Rate:  [61] 61 (07/19 1939) Cardiac Rhythm: Sinus bradycardia (07/20 0736) Resp:  [12-19] 16 (07/20 0716) BP: (96-126)/(61-70) 126/64 (07/20 0716) SpO2:  [100 %] 100 % (07/19 1939)     Intake/Output from previous day: 07/19 0701 - 07/20 0700 In: 153 [I.V.:3; IV Piggyback:150] Out: 2813 [Urine:2725; Drains:88] Intake/Output this shift: Total I/O In: -  Out: 700 [Urine:700]  General appearance: alert, cooperative and no distress Heart: regular rate and rhythm, S1, S2 normal, no murmur, click, rub or gallop Lungs: clear to auscultation bilaterally Abdomen: soft, non-tender; bowel sounds normal; no masses,  no organomegaly Extremities: extremities normal, atraumatic, no cyanosis or edema Wound: covered with sterile dressing, good suction  Lab Results: Recent Labs    10/10/19 0255  WBC 9.2  HGB 10.0*  HCT 31.7*  PLT 497*   BMET:  Recent Labs    10/10/19 0255  NA 133*  K 4.1  CL 101  CO2 23  GLUCOSE 111*  BUN 26*  CREATININE 0.87  CALCIUM 9.0    PT/INR: No results for input(s): LABPROT, INR in the last 72 hours. ABG    Component Value Date/Time   PHART 7.409 09/16/2019 1059   HCO3 22.2 09/16/2019 1059   TCO2 27 08/05/2019 1859   ACIDBASEDEF 1.8 09/16/2019 1059   O2SAT 98.1 09/16/2019 1059   CBG (last 3)  Recent Labs    10/10/19 2356 10/11/19 0344 10/11/19 0738  GLUCAP 91 95 106*    Assessment/Plan: S/P Procedure(s)  (LRB): IRRIGATION AND DEBRIDEMENT OF STERNOCLAVICULAR JOINT-STERNUM AND RIBS (N/A) ADJACENT TISSUE TRANSFER/TISSUE REARRANGEMENT, ABRA MUSCLE FLAP (N/A) IRRIGATION AND DEBRIDEMENT STERNUM WOUND POSSIBLE PLACEMENT OF A CELL (N/A)  1. CV-NSR in the 70s, BP well controlled 2. Pulm-tolerating room air with good oxygen support. Continue incentive spirometer. Drainage more clear this morning. Tube striped. 88cc recorded yesterday.  3. Renal-creatinine 0.87, electrolytes okay 4. Endo-blood glucose well controlled 5. H and H 10.0/31.7, expected acute blood loss anemia 6. ID WBC 9.2, last dose of Vancomycin today.  Plan: Last dose of Vanc today. Sternal dressing to be removed tomorrow by plastics. No recent fevers/chills.    LOS: 13 days    Sharlene Dory 10/11/2019  I have seen and examined the patient and agree with the assessment and plan as outlined.  Purcell Nails, MD 10/11/2019 1:48 PM

## 2019-10-11 NOTE — Progress Notes (Signed)
6 Days Post-Op  Subjective: Doing well, no complaints. Reports he feels well.  Objective: Vital signs in last 24 hours: Temp:  [97.6 F (36.4 C)-98.3 F (36.8 C)] 98 F (36.7 C) (07/20 0716) Pulse Rate:  [61] 61 (07/19 1939) Resp:  [12-19] 16 (07/20 0716) BP: (96-126)/(61-70) 126/64 (07/20 0716) SpO2:  [100 %] 100 % (07/19 1939) Last BM Date: 10/10/19  Intake/Output from previous day: 07/19 0701 - 07/20 0700 In: 153 [I.V.:3; IV Piggyback:150] Out: 2813 [Urine:2725; Drains:88] Intake/Output this shift: Total I/O In: -  Out: 700 [Urine:700]  General appearance: alert, cooperative, no distress and resting in bed Head: Normocephalic, without obvious abnormality, atraumatic Chest wall: no tenderness, wound vac over sternal incision, good seal noted. JP drain in palce with serosanguinous drainage noted. No erythema. No TTP. No swelling noted.  Lab Results:  CBC Latest Ref Rng & Units 10/10/2019 10/08/2019 10/06/2019  WBC 4.0 - 10.5 K/uL 9.2 10.7(H) 13.8(H)  Hemoglobin 13.0 - 17.0 g/dL 10.0(L) 10.9(L) 10.5(L)  Hematocrit 39 - 52 % 31.7(L) 33.5(L) 32.9(L)  Platelets 150 - 400 K/uL 497(H) 427(H) 522(H)    BMET Recent Labs    10/10/19 0255  NA 133*  K 4.1  CL 101  CO2 23  GLUCOSE 111*  BUN 26*  CREATININE 0.87  CALCIUM 9.0   PT/INR No results for input(s): LABPROT, INR in the last 72 hours. ABG No results for input(s): PHART, HCO3 in the last 72 hours.  Invalid input(s): PCO2, PO2  Studies/Results: No results found.  Anti-infectives: Anti-infectives (From admission, onward)   Start     Dose/Rate Route Frequency Ordered Stop   10/05/19 1412  polymyxin B 500,000 Units, bacitracin 50,000 Units in sodium chloride 0.9 % 500 mL irrigation  Status:  Discontinued          As needed 10/05/19 1412 10/05/19 1605   10/05/19 0600  ceFAZolin (ANCEF) IVPB 2g/100 mL premix        2 g 200 mL/hr over 30 Minutes Intravenous To Short Stay 10/04/19 1225 10/05/19 1428   10/04/19 0000   vancomycin (VANCOREADY) IVPB 750 mg/150 mL     Discontinue     750 mg 150 mL/hr over 60 Minutes Intravenous Every 12 hours 10/03/19 1430     09/29/19 1200  vancomycin (VANCOCIN) IVPB 1000 mg/200 mL premix  Status:  Discontinued        1,000 mg 200 mL/hr over 60 Minutes Intravenous Every 12 hours 09/28/19 2353 10/03/19 1430   09/29/19 1000  cefTAZidime (FORTAZ) 2 g in sodium chloride 0.9 % 100 mL IVPB  Status:  Discontinued        2 g 200 mL/hr over 30 Minutes Intravenous Every 8 hours 09/29/19 0916 10/03/19 1352   09/29/19 0000  vancomycin (VANCOREADY) IVPB 1500 mg/300 mL        1,500 mg 150 mL/hr over 120 Minutes Intravenous NOW 09/28/19 2353 09/29/19 0259      Assessment/Plan: s/p Procedure(s): IRRIGATION AND DEBRIDEMENT OF STERNOCLAVICULAR JOINT-STERNUM AND RIBS ADJACENT TISSUE TRANSFER/TISSUE REARRANGEMENT, ABRA MUSCLE FLAP IRRIGATION AND DEBRIDEMENT STERNUM WOUND POSSIBLE PLACEMENT OF A CELL  Patient is doing well, no complaints.  Plan to remove wound vac tomorrow. Potentially replace pending appearance.  JP drain with 88 cc total output yesterday, continue to monitor for appropriate time to remove.    LOS: 13 days    Leslee Home, PA-C 10/11/2019

## 2019-10-12 LAB — GLUCOSE, CAPILLARY
Glucose-Capillary: 105 mg/dL — ABNORMAL HIGH (ref 70–99)
Glucose-Capillary: 113 mg/dL — ABNORMAL HIGH (ref 70–99)
Glucose-Capillary: 115 mg/dL — ABNORMAL HIGH (ref 70–99)
Glucose-Capillary: 126 mg/dL — ABNORMAL HIGH (ref 70–99)
Glucose-Capillary: 86 mg/dL (ref 70–99)
Glucose-Capillary: 95 mg/dL (ref 70–99)

## 2019-10-12 NOTE — Progress Notes (Addendum)
      [address]       [place] [address]             [phone]      7 Days Post-Op Procedure(s) (LRB): IRRIGATION AND DEBRIDEMENT OF STERNOCLAVICULAR JOINT-STERNUM AND RIBS (N/A) ADJACENT TISSUE TRANSFER/TISSUE REARRANGEMENT, ABRA MUSCLE FLAP (N/A) IRRIGATION AND DEBRIDEMENT STERNUM WOUND POSSIBLE PLACEMENT OF A CELL (N/A) Subjective: Feels good today. No complaints.   Objective: Vital signs in last 24 hours: Temp:  [97.9 F (36.6 C)-98.1 F (36.7 C)] 98 F (36.7 C) (07/21 0351) Pulse Rate:  [63] 63 (07/20 1932) Cardiac Rhythm: Sinus bradycardia (07/21 0704) Resp:  [12-16] 14 (07/21 0351) BP: (107-117)/(60-67) 117/60 (07/21 0351) SpO2:  [98 %] 98 % (07/20 1932)     Intake/Output from previous day: 07/20 0701 - 07/21 0700 In: 783 [P.O.:480; I.V.:3; IV Piggyback:300] Out: 3215 [Urine:3125; Drains:90] Intake/Output this shift: No intake/output data recorded.  General appearance: alert, cooperative and no distress Heart: regular rate and rhythm, S1, S2 normal, no murmur, click, rub or gallop Lungs: clear to auscultation bilaterally Abdomen: soft, non-tender; bowel sounds normal; no masses,  no organomegaly Extremities: extremities normal, atraumatic, no cyanosis or edema Wound: clean and dry under a sterile dressing  Lab Results: Recent Labs    10/10/19 0255  WBC 9.2  HGB 10.0*  HCT 31.7*  PLT 497*   BMET:  Recent Labs    10/10/19 0255  NA 133*  K 4.1  CL 101  CO2 23  GLUCOSE 111*  BUN 26*  CREATININE 0.87  CALCIUM 9.0    PT/INR: No results for input(s): LABPROT, INR in the last 72 hours. ABG    Component Value Date/Time   PHART 7.409 09/16/2019 1059   HCO3 22.2 09/16/2019 1059   TCO2 27 08/05/2019 1859   ACIDBASEDEF 1.8 09/16/2019 1059   O2SAT 98.1 09/16/2019 1059   CBG (last 3)  Recent Labs    10/11/19 1949 10/11/19 2355 10/12/19 0349  GLUCAP 107* 96 86    Assessment/Plan: S/P Procedure(s) (LRB): IRRIGATION AND  DEBRIDEMENT OF STERNOCLAVICULAR JOINT-STERNUM AND RIBS (N/A) ADJACENT TISSUE TRANSFER/TISSUE REARRANGEMENT, ABRA MUSCLE FLAP (N/A) IRRIGATION AND DEBRIDEMENT STERNUM WOUND POSSIBLE PLACEMENT OF A CELL (N/A)   1. CV-NSR in the 70s, BP well controlled 2. Pulm-tolerating room air with good oxygen support. Continue incentive spirometer. Drainage more clear this morning. 90cc recorded yesterday.  3. Renal-creatinine 0.87, electrolytes okay 4. Endo-blood glucose well controlled 5. H and H 10.0/31.7, expected acute blood loss anemia 6. ID WBC 9.2  Plan: Plastics to remove dressing today and possibly redress if needed. Continue drain until dry. Case management continued to work on plans for after the hospital. He may be able to go to his sisters apartment near [place], [place]   LOS: 14 days    Sharlene Dory 10/12/2019   I have seen and examined the patient and agree with the assessment and plan as outlined.  Purcell Nails, MD 10/12/2019

## 2019-10-12 NOTE — Plan of Care (Signed)

## 2019-10-12 NOTE — TOC Progression Note (Signed)
Transition of Care [facility]) - Progression Note    Patient Details  Name: Matthew [name] MRN: [id number] Date of Birth: [birth year]  [facility] CM/SW Contact  Leone Haven, RN Phone Number: 10/12/2019, 4:05 PM  Clinical Narrative:    Per plastics note plan is to remove wound vac tomorrow. TOC will continue to follow .   Expected Discharge Plan: Home w Home Health Services Barriers to Discharge: Continued Medical Work up, Family Issues  Expected Discharge Plan and Services Expected Discharge Plan: Home w Home Health Services In-house Referral: Clinical Social Work     Living arrangements for the past 2 months: Mobile Home                                       Social Determinants of Health (SDOH) Interventions    Readmission Risk Interventions Readmission Risk Prevention Plan 10/06/2019 09/22/2019 08/16/2019  Post Dischage Appt - Complete -  Medication Screening - Complete -  Transportation Screening Complete Complete Complete  PCP or Specialist Appt within 5-7 Days Complete - Complete  Home Care Screening Complete - Complete  Medication Review (RN CM) Referral to Pharmacy - Complete

## 2019-10-13 LAB — GLUCOSE, CAPILLARY
Glucose-Capillary: 100 mg/dL — ABNORMAL HIGH (ref 70–99)
Glucose-Capillary: 117 mg/dL — ABNORMAL HIGH (ref 70–99)

## 2019-10-13 MED ORDER — POLYETHYLENE GLYCOL 3350 17 G PO PACK
17.0000 g | PACK | Freq: Every day | ORAL | Status: DC
  Administered 2019-10-13 – 2019-10-16 (×4): 17 g via ORAL
  Filled 2019-10-13 (×5): qty 1

## 2019-10-13 MED ORDER — SENNOSIDES-DOCUSATE SODIUM 8.6-50 MG PO TABS: 1.0000 | ORAL_TABLET | Freq: Every day | ORAL | Status: DC | PRN

## 2019-10-13 MED ORDER — DOCUSATE SODIUM 100 MG PO CAPS
100.0000 mg | ORAL_CAPSULE | Freq: Every day | ORAL | Status: DC | PRN
  Administered 2019-10-13 – 2019-10-15 (×2): 100 mg via ORAL
  Filled 2019-10-13 (×2): qty 1

## 2019-10-13 NOTE — Progress Notes (Signed)
      [address]       [place] [address]             [phone]      8 Days Post-Op Procedure(s) (LRB): IRRIGATION AND DEBRIDEMENT OF STERNOCLAVICULAR JOINT-STERNUM AND RIBS (N/A) ADJACENT TISSUE TRANSFER/TISSUE REARRANGEMENT, ABRA MUSCLE FLAP (N/A) IRRIGATION AND DEBRIDEMENT STERNUM WOUND POSSIBLE PLACEMENT OF A CELL (N/A) Subjective: Constipation today with straining earlier.   Objective: Vital signs in last 24 hours: Temp:  [97.6 F (36.4 C)-98.4 F (36.9 C)] 97.7 F (36.5 C) (07/22 0800) Pulse Rate:  [60-67] 67 (07/22 0800) Cardiac Rhythm: Normal sinus rhythm (07/22 0700) Resp:  [9-18] 9 (07/22 0800) BP: (99-117)/(58-67) 110/61 (07/22 0800) SpO2:  [92 %-100 %] 97 % (07/22 0800)     Intake/Output from previous day: 07/21 0701 - 07/22 0700 In: 720 [P.O.:720] Out: 2310 [Urine:2250; Drains:60] Intake/Output this shift: Total I/O In: -  Out: 625 [Urine:625]  General appearance: alert, cooperative and no distress Heart: regular rate and rhythm, S1, S2 normal, no murmur, click, rub or gallop Lungs: clear to auscultation bilaterally Abdomen: soft, non-tender; diminished bowel sounds Extremities: extremities normal, atraumatic, no cyanosis or edema Wound: clean and dry covered with a dressing.   Lab Results: No results for input(s): WBC, HGB, HCT, PLT in the last 72 hours. BMET: No results for input(s): NA, K, CL, CO2, GLUCOSE, BUN, CREATININE, CALCIUM in the last 72 hours.  PT/INR: No results for input(s): LABPROT, INR in the last 72 hours. ABG    Component Value Date/Time   PHART 7.409 09/16/2019 1059   HCO3 22.2 09/16/2019 1059   TCO2 27 08/05/2019 1859   ACIDBASEDEF 1.8 09/16/2019 1059   O2SAT 98.1 09/16/2019 1059   CBG (last 3)  Recent Labs    10/12/19 2339 10/13/19 0342 10/13/19 0814  GLUCAP 126* 100* 117*    Assessment/Plan: S/P Procedure(s) (LRB): IRRIGATION AND DEBRIDEMENT OF STERNOCLAVICULAR JOINT-STERNUM AND RIBS  (N/A) ADJACENT TISSUE TRANSFER/TISSUE REARRANGEMENT, ABRA MUSCLE FLAP (N/A) IRRIGATION AND DEBRIDEMENT STERNUM WOUND POSSIBLE PLACEMENT OF A CELL (N/A)  1. CV-NSR in the 70s, BP well controlled 2. Pulm-tolerating room air with good oxygen support. Continue incentive spirometer. Drainage more clear this morning. 60cc recorded yesterday.  3. Renal-creatinine 0.87, electrolytes okay 4. Endo-blood glucose well controlled 5. H and H 10.0/31.7, expected acute blood loss anemia 6. ID WBC 9.2 7. Constipation-ordered stool softeners PRN and Miralax daily.   Plan: No new labs in a few days, will order some for tomorrow. New dressing placed by plastics, possible removal of the tube today. Working on a discharge plan however his social situation remains complicated.    LOS: 15 days    Sharlene Dory 10/13/2019

## 2019-10-14 LAB — BASIC METABOLIC PANEL
Anion gap: 7 (ref 5–15)
BUN: 20 mg/dL (ref 8–23)
CO2: 28 mmol/L (ref 22–32)
Calcium: 9 mg/dL (ref 8.9–10.3)
Chloride: 101 mmol/L (ref 98–111)
Creatinine, Ser: 0.88 mg/dL (ref 0.61–1.24)
GFR calc Af Amer: >60 mL/min (ref >60)
GFR calc non Af Amer: >60 mL/min (ref >60)
Glucose, Bld: 96 mg/dL (ref 70–99)
Potassium: 4.6 mmol/L (ref 3.5–5.1)
Sodium: 136 mmol/L (ref 135–145)

## 2019-10-14 LAB — CBC
HCT: 32.3 % — ABNORMAL LOW (ref 39.0–52.0)
Hemoglobin: 10.3 g/dL — ABNORMAL LOW (ref 13.0–17.0)
MCH: 28.1 pg (ref 26.0–34.0)
MCHC: 31.9 g/dL (ref 30.0–36.0)
MCV: 88.3 fL (ref 80.0–100.0)
Platelets: 460 10*3/uL — ABNORMAL HIGH (ref 150–400)
RBC: 3.66 MIL/uL — ABNORMAL LOW (ref 4.22–5.81)
RDW: 13.8 % (ref 11.5–15.5)
WBC: 8.4 10*3/uL (ref 4.0–10.5)
nRBC: 0 % (ref 0.0–0.2)

## 2019-10-14 NOTE — Progress Notes (Signed)
      [address]       [place] [address]             [phone]      9 Days Post-Op Procedure(s) (LRB): IRRIGATION AND DEBRIDEMENT OF STERNOCLAVICULAR JOINT-STERNUM AND RIBS (N/A) ADJACENT TISSUE TRANSFER/TISSUE REARRANGEMENT, ABRA MUSCLE FLAP (N/A) IRRIGATION AND DEBRIDEMENT STERNUM WOUND POSSIBLE PLACEMENT OF A CELL (N/A) Subjective: Feels okay this morning. He is wondering if his chest drain will be removed today.   Objective: Vital signs in last 24 hours: Temp:  [97.6 F (36.4 C)-98.2 F (36.8 C)] 98 F (36.7 C) (07/23 0739) Pulse Rate:  [57-97] 85 (07/23 0739) Cardiac Rhythm: Normal sinus rhythm (07/23 0739) Resp:  [12-16] 15 (07/23 0739) BP: (114-132)/(65-72) 116/72 (07/23 0739) SpO2:  [95 %-100 %] 100 % (07/23 0739)     Intake/Output from previous day: 07/22 0701 - 07/23 0700 In: 1330 [P.O.:1330] Out: 3235 [Urine:3150; Drains:85] Intake/Output this shift: Total I/O In: 240 [P.O.:240] Out: 550 [Urine:550]  General appearance: alert, cooperative and no distress Heart: regular rate and rhythm, S1, S2 normal, no murmur, click, rub or gallop Lungs: clear to auscultation bilaterally Abdomen: soft, non-tender; bowel sounds normal; no masses,  no organomegaly Extremities: extremities normal, atraumatic, no cyanosis or edema Wound: clean and dry, dressed with a sterile dressing.   Lab Results: Recent Labs    10/14/19 0217  WBC 8.4  HGB 10.3*  HCT 32.3*  PLT 460*   BMET:  Recent Labs    10/14/19 0217  NA 136  K 4.6  CL 101  CO2 28  GLUCOSE 96  BUN 20  CREATININE 0.88  CALCIUM 9.0    PT/INR: No results for input(s): LABPROT, INR in the last 72 hours. ABG    Component Value Date/Time   PHART 7.409 09/16/2019 1059   HCO3 22.2 09/16/2019 1059   TCO2 27 08/05/2019 1859   ACIDBASEDEF 1.8 09/16/2019 1059   O2SAT 98.1 09/16/2019 1059   CBG (last 3)  Recent Labs    10/12/19 2339 10/13/19 0342 10/13/19 0814  GLUCAP 126* 100*  117*    Assessment/Plan: S/P Procedure(s) (LRB): IRRIGATION AND DEBRIDEMENT OF STERNOCLAVICULAR JOINT-STERNUM AND RIBS (N/A) ADJACENT TISSUE TRANSFER/TISSUE REARRANGEMENT, ABRA MUSCLE FLAP (N/A) IRRIGATION AND DEBRIDEMENT STERNUM WOUND POSSIBLE PLACEMENT OF A CELL (N/A)  1. CV-NSR in the 70s, BP well controlled 2. Pulm-tolerating room air with good oxygen support. Continue incentive spirometer. Drainage more clear this morning.85cc recorded yesterday.  3. Renal-creatinine 0.88, electrolytes okay 4. Endo-blood glucose well controlled 5. H and H 10.3/32.3, expected acute blood loss anemia 6. ID WBC 9.2 7. Constipation-ordered stool softeners PRN and Miralax daily.  Plan: Continue medical therapy. Hopefully chest tube can come out soon. Continue ambulation in the halls. Continue use of incentive spirometer. He is discussing a discharge plan with his sister who lives in [place], [place].    [place]: 16 days    Sharlene Dory 10/14/2019

## 2019-10-14 NOTE — Plan of Care (Signed)

## 2019-10-14 NOTE — Progress Notes (Signed)
9 Days Post-Op  Subjective: Patient is a 64 year old male who underwent I&D of sternoclavicular joint sternum and ribs with adjacent tissue transfer/tissue rearrangement, ABRA muscle.  Wound VAC was removed on 7/21.  Today patient is sitting up in bed reports he is feeling well overall.  He is happy to have gotten rid of the wound VAC and looks forward to being able to get rid of the drain.  Mepilex border dressing was in place and removed for examination.  Incision is healing nicely, C/D/I.  No signs of infection, redness, drainage, seroma/hematoma.  Drain is in place with serosanguineous fluid in the bulb.  Patient denies fever, chest pain, shortness of breath, nausea/vomiting.  Reports he has been using his spirometry machine.  Objective: Vital signs in last 24 hours: Temp:  [97.7 F (36.5 C)-98.2 F (36.8 C)] 98 F (36.7 C) (07/23 1559) Pulse Rate:  [57-97] 65 (07/23 1559) Resp:  [12-17] 17 (07/23 1600) BP: (114-132)/(60-72) 122/70 (07/23 1600) SpO2:  [95 %-100 %] 97 % (07/23 1559) Last BM Date: 10/12/19  Intake/Output from previous day: 07/22 0701 - 07/23 0700 In: 1330 [P.O.:1330] Out: 3235 [Urine:3150; Drains:85] Intake/Output this shift: Total I/O In: 720 [P.O.:720] Out: 1470 [Urine:1450; Drains:20]  General appearance: alert, cooperative and no distress Head: Normocephalic, without obvious abnormality, atraumatic Eyes: EOMs intact Resp: nonlabored Chest wall: Incision C/D/I. No signs of infection, redness, drainage, seroma/hematoma.  Lab Results:  CBC    Component Value Date/Time   WBC 8.4 10/14/2019 0217   RBC 3.66 (L) 10/14/2019 0217   HGB 10.3 (L) 10/14/2019 0217   HCT 32.3 (L) 10/14/2019 0217   PLT 460 (H) 10/14/2019 0217   MCV 88.3 10/14/2019 0217   MCH 28.1 10/14/2019 0217   MCHC 31.9 10/14/2019 0217   RDW 13.8 10/14/2019 0217   BMET Recent Labs    10/14/19 0217  NA 136  K 4.6  CL 101  CO2 28  GLUCOSE 96  BUN 20  CREATININE 0.88  CALCIUM 9.0    PT/INR No results for input(s): LABPROT, INR in the last 72 hours. ABG No results for input(s): PHART, HCO3 in the last 72 hours.  Invalid input(s): PCO2, PO2  Studies/Results: No results found.  Anti-infectives: Anti-infectives (From admission, onward)   Start     Dose/Rate Route Frequency Ordered Stop   10/05/19 1412  polymyxin B 500,000 Units, bacitracin 50,000 Units in sodium chloride 0.9 % 500 mL irrigation  Status:  Discontinued          As needed 10/05/19 1412 10/05/19 1605   10/05/19 0600  ceFAZolin (ANCEF) IVPB 2g/100 mL premix        2 g 200 mL/hr over 30 Minutes Intravenous To Short Stay 10/04/19 1225 10/05/19 1428   10/04/19 0000  vancomycin (VANCOREADY) IVPB 750 mg/150 mL        750 mg 150 mL/hr over 60 Minutes Intravenous Every 12 hours 10/03/19 1430 10/11/19 2359   09/29/19 1200  vancomycin (VANCOCIN) IVPB 1000 mg/200 mL premix  Status:  Discontinued        1,000 mg 200 mL/hr over 60 Minutes Intravenous Every 12 hours 09/28/19 2353 10/03/19 1430   09/29/19 1000  cefTAZidime (FORTAZ) 2 g in sodium chloride 0.9 % 100 mL IVPB  Status:  Discontinued        2 g 200 mL/hr over 30 Minutes Intravenous Every 8 hours 09/29/19 0916 10/03/19 1352   09/29/19 0000  vancomycin (VANCOREADY) IVPB 1500 mg/300 mL  1,500 mg 150 mL/hr over 120 Minutes Intravenous NOW 09/28/19 2353 09/29/19 0259      Assessment/Plan: s/p Procedure(s): IRRIGATION AND DEBRIDEMENT OF STERNOCLAVICULAR JOINT-STERNUM AND RIBS ADJACENT TISSUE TRANSFER/TISSUE REARRANGEMENT, ABRA MUSCLE FLAP IRRIGATION AND DEBRIDEMENT STERNUM WOUND POSSIBLE PLACEMENT OF A CELL Plan to remove drain on Monday.  Change Mepilex border dressing every other day.  LOS: 16 days    Eldridge Abrahams, [place] 10/14/2019

## 2019-10-15 NOTE — Progress Notes (Addendum)
      [address]       [employer] [address]             [phone]      10 Days Post-Op Procedure(s) (LRB): IRRIGATION AND DEBRIDEMENT OF STERNOCLAVICULAR JOINT-STERNUM AND RIBS (N/A) ADJACENT TISSUE TRANSFER/TISSUE REARRANGEMENT, ABRA MUSCLE FLAP (N/A) IRRIGATION AND DEBRIDEMENT STERNUM WOUND POSSIBLE PLACEMENT OF A CELL (N/A) Subjective: Feels fine  Objective: Vital signs in last 24 hours: Temp:  [97.8 F (36.6 C)-98.5 F (36.9 C)] 97.8 F (36.6 C) (07/24 0700) Pulse Rate:  [60-65] 60 (07/24 0356) Cardiac Rhythm: Normal sinus rhythm;Bundle branch block (07/24 0741) Resp:  [12-17] 12 (07/24 0700) BP: (110-133)/(60-70) 110/60 (07/24 0700) SpO2:  [94 %-97 %] 95 % (07/24 0700) Weight:  [78.4 kg] 78.4 kg (07/24 0356)  Hemodynamic parameters for last 24 hours:    Intake/Output from previous day: 07/23 0701 - 07/24 0700 In: 1683 [P.O.:1680; I.V.:3] Out: 4135 [Urine:4075; Drains:60] Intake/Output this shift: Total I/O In: -  Out: 10 [Drains:10]  Wound: dressings in place, min drainage from JP  Lab Results: Recent Labs    10/14/19 0217  WBC 8.4  HGB 10.3*  HCT 32.3*  PLT 460*   BMET:  Recent Labs    10/14/19 0217  NA 136  K 4.6  CL 101  CO2 28  GLUCOSE 96  BUN 20  CREATININE 0.88  CALCIUM 9.0    PT/INR: No results for input(s): LABPROT, INR in the last 72 hours. ABG    Component Value Date/Time   PHART 7.409 09/16/2019 1059   HCO3 22.2 09/16/2019 1059   TCO2 27 08/05/2019 1859   ACIDBASEDEF 1.8 09/16/2019 1059   O2SAT 98.1 09/16/2019 1059   CBG (last 3)  Recent Labs    10/12/19 2339 10/13/19 0342 10/13/19 0814  GLUCAP 126* 100* 117*    Meds Scheduled Meds: . aspirin EC  81 mg Oral Daily  . atorvastatin  80 mg Oral Daily  . levothyroxine  100 mcg Oral Q0600  . nicotine  14 mg Transdermal Daily  . polyethylene glycol  17 g Oral Daily  . Ensure Max Protein  11 oz Oral Daily  . sodium chloride flush  3 mL Intravenous  Q12H  . varenicline  1 mg Oral BID   Continuous Infusions: PRN Meds:.chlorpheniramine-HYDROcodone, docusate sodium, levalbuterol, morphine injection, ondansetron **OR** [DISCONTINUED] ondansetron (ZOFRAN) IV, oxyCODONE, senna-docusate, traMADol  Xrays No results found.  Assessment/Plan: S/P Procedure(s) (LRB): IRRIGATION AND DEBRIDEMENT OF STERNOCLAVICULAR JOINT-STERNUM AND RIBS (N/A) ADJACENT TISSUE TRANSFER/TISSUE REARRANGEMENT, ABRA MUSCLE FLAP (N/A) IRRIGATION AND DEBRIDEMENT STERNUM WOUND POSSIBLE PLACEMENT OF A CELL (N/A)  1 afeb, VSS 2  stable on current RX/TX 3 no new labs   LOS: 17 days    Rowe Clack PA-C Pager [phone] 10/15/2019  Wound stable Drain in poss removal Monday I have seen and examined Matthew [name] and agree with the above assessment  and plan.  Delight Ovens MD Beeper [phone] Office [phone] 10/15/2019 11:33 AM

## 2019-10-15 NOTE — Plan of Care (Signed)

## 2019-10-16 NOTE — Progress Notes (Addendum)
      [address]       [employer] [address]             [phone]      11 Days Post-Op Procedure(s) (LRB): IRRIGATION AND DEBRIDEMENT OF STERNOCLAVICULAR JOINT-STERNUM AND RIBS (N/A) ADJACENT TISSUE TRANSFER/TISSUE REARRANGEMENT, ABRA MUSCLE FLAP (N/A) IRRIGATION AND DEBRIDEMENT STERNUM WOUND POSSIBLE PLACEMENT OF A CELL (N/A) Subjective: Feels fine  Objective: Vital signs in last 24 hours: Temp:  [97.9 F (36.6 C)-98.3 F (36.8 C)] 97.9 F (36.6 C) (07/25 0813) Pulse Rate:  [62-67] 67 (07/25 0813) Cardiac Rhythm: Sinus bradycardia (07/25 0743) Resp:  [11-18] 14 (07/25 0813) BP: (106-118)/(62-77) 106/62 (07/25 0813) SpO2:  [96 %-100 %] 97 % (07/25 0813)  Hemodynamic parameters for last 24 hours:    Intake/Output from previous day: 07/24 0701 - 07/25 0700 In: 610 [P.O.:600] Out: 3285 [Urine:3275; Drains:10] Intake/Output this shift: Total I/O In: 450 [P.O.:450] Out: 1035 [Urine:1025; Drains:10]  General appearance: alert, cooperative and no distress Wound: dressings intact  Lab Results: Recent Labs    10/14/19 0217  WBC 8.4  HGB 10.3*  HCT 32.3*  PLT 460*   BMET:  Recent Labs    10/14/19 0217  NA 136  K 4.6  CL 101  CO2 28  GLUCOSE 96  BUN 20  CREATININE 0.88  CALCIUM 9.0    PT/INR: No results for input(s): LABPROT, INR in the last 72 hours. ABG    Component Value Date/Time   PHART 7.409 09/16/2019 1059   HCO3 22.2 09/16/2019 1059   TCO2 27 08/05/2019 1859   ACIDBASEDEF 1.8 09/16/2019 1059   O2SAT 98.1 09/16/2019 1059   CBG (last 3)  No results for input(s): GLUCAP in the last 72 hours.  Meds Scheduled Meds: . aspirin EC  81 mg Oral Daily  . atorvastatin  80 mg Oral Daily  . levothyroxine  100 mcg Oral Q0600  . nicotine  14 mg Transdermal Daily  . polyethylene glycol  17 g Oral Daily  . Ensure Max Protein  11 oz Oral Daily  . sodium chloride flush  3 mL Intravenous Q12H  . varenicline  1 mg Oral BID   Continuous  Infusions: PRN Meds:.chlorpheniramine-HYDROcodone, docusate sodium, levalbuterol, morphine injection, ondansetron **OR** [DISCONTINUED] ondansetron (ZOFRAN) IV, oxyCODONE, senna-docusate, traMADol  Xrays No results found.  Assessment/Plan: S/P Procedure(s) (LRB): IRRIGATION AND DEBRIDEMENT OF STERNOCLAVICULAR JOINT-STERNUM AND RIBS (N/A) ADJACENT TISSUE TRANSFER/TISSUE REARRANGEMENT, ABRA MUSCLE FLAP (N/A) IRRIGATION AND DEBRIDEMENT STERNUM WOUND POSSIBLE PLACEMENT OF A CELL (N/A)  1 afeb, VSS 2 plan for dressing change, poss d/c drain tomorrow w/plastics and then poss home   LOS: 18 days    Rowe Clack PA-C Pager [phone] 10/16/2019  Very low drainage from the bulb drain Poss home tomorrow I have seen and examined Marcheta Grammes and agree with the above assessment  and plan.  Delight Ovens MD Beeper [phone] Office [phone] 10/16/2019 11:40 AM

## 2019-10-17 MED ORDER — VARENICLINE TARTRATE 1 MG PO TABS: 1.0000 mg | ORAL_TABLET | Freq: Two times a day (BID) | ORAL | 1 refills | Status: DC

## 2019-10-17 MED ORDER — ASPIRIN 81 MG PO TBEC: 81.0000 mg | DELAYED_RELEASE_TABLET | Freq: Every day | ORAL | 11 refills | Status: AC

## 2019-10-17 MED ORDER — NICOTINE 14 MG/24HR TD PT24: 14.0000 mg | MEDICATED_PATCH | Freq: Every day | TRANSDERMAL | 0 refills | Status: AC

## 2019-10-17 MED ORDER — TRAMADOL HCL 50 MG PO TABS: 50.0000 mg | ORAL_TABLET | Freq: Four times a day (QID) | ORAL | 0 refills | Status: AC | PRN

## 2019-10-17 MED ORDER — ATORVASTATIN CALCIUM 40 MG PO TABS: 40.0000 mg | ORAL_TABLET | Freq: Every day | ORAL | 3 refills | Status: DC

## 2019-10-17 MED FILL — ATORVASTATIN CALCIUM 40 MG: 40 | 30 days supply | Qty: 30 | Fill #0

## 2019-10-17 MED FILL — traMADol HCL 50 MG TABS: 50 | 5 days supply | Qty: 20 | Fill #0

## 2019-10-17 MED FILL — ASPIRIN LOW DOSE 81 MG TBEC: 81 | 30 days supply | Qty: 30 | Fill #0

## 2019-10-17 NOTE — Progress Notes (Addendum)
      [address]       [employer] [place] [address]             [phone]       12 Days Post-Op Procedure(s) (LRB): IRRIGATION AND DEBRIDEMENT OF STERNOCLAVICULAR JOINT-STERNUM AND RIBS (N/A) ADJACENT TISSUE TRANSFER/TISSUE REARRANGEMENT, ABRA MUSCLE FLAP (N/A) IRRIGATION AND DEBRIDEMENT STERNUM WOUND POSSIBLE PLACEMENT OF A CELL (N/A) Subjective: Awake and alert.  No problems or concerns. Minimal pain.  Objective: Vital signs in last 24 hours: Temp:  [97.6 F (36.4 C)-98.8 F (37.1 C)] 98.8 F (37.1 C) (07/26 0721) Pulse Rate:  [55-67] 55 (07/26 0433) Cardiac Rhythm: Sinus bradycardia (07/26 0741) Resp:  [11-17] 16 (07/26 0433) BP: (103-115)/(61-75) 115/61 (07/26 0433) SpO2:  [94 %-98 %] 96 % (07/26 0433)     Intake/Output from previous day: 07/25 0701 - 07/26 0700 In: 1330 [P.O.:1330] Out: 3970 [Urine:3950; Drains:20] Intake/Output this shift: No intake/output data recorded.  General appearance: alert, cooperative and no distress Neurologic: intact Heart: regular rate and rhythm Lungs: Breath sounds are clear, no dyspnea on RA.  Abdomen: soft, NT.  Wound: A dry dressing remains in place over the sterrnal incision.  Thin serous drainage in JP bulb.   Lab Results: No results for input(s): WBC, HGB, HCT, PLT in the last 72 hours. BMET: No results for input(s): NA, K, CL, CO2, GLUCOSE, BUN, CREATININE, CALCIUM in the last 72 hours.  PT/INR: No results for input(s): LABPROT, INR in the last 72 hours. ABG    Component Value Date/Time   PHART 7.409 09/16/2019 1059   HCO3 22.2 09/16/2019 1059   TCO2 27 08/05/2019 1859   ACIDBASEDEF 1.8 09/16/2019 1059   O2SAT 98.1 09/16/2019 1059   CBG (last 3)  No results for input(s): GLUCAP in the last 72 hours.  Assessment/Plan: S/P Procedure(s) (LRB): IRRIGATION AND DEBRIDEMENT OF STERNOCLAVICULAR JOINT-STERNUM AND RIBS (N/A) ADJACENT TISSUE TRANSFER/TISSUE REARRANGEMENT, ABRA MUSCLE FLAP (N/A) IRRIGATION AND  DEBRIDEMENT STERNUM WOUND POSSIBLE PLACEMENT OF A CELL (N/A)    LOS: 19 days    -POD12 sternal wound debridement and complex closure with pectoralis major turnover flap. He remains afbrile and feels good in general. He has minimal drainage from the [place] drain. Will defer inspection of sternal wound to the plastic surgery team and will arrange discharge with their approval.     Leary Roca, PA-C [phone] 10/17/2019   Chart reviewed, patient examined, agree with above. Says he feels well. Minimal drain output. Chest dressing intact. Plastic surgery will decide on drain removal and examine wound. We will arrange discharge with his ex-wife when felt appropriate. I will see him back in a week or so but try to arrange with plastic surgery followup since he lives a distance away. His left leg wound is granulating well and no sign of infection. Continue daily wet to dry dressing.

## 2019-10-17 NOTE — Progress Notes (Signed)
Ready for discharge, discharged  Instructions given to pt. Awaiting pharmacy to deliver meds. MSW to arrange transportation.

## 2019-10-17 NOTE — Op Note (Addendum)
CARDIOVASCULAR SURGERY OPERATIVE NOTE  10/05/2019  Surgeon:  Alleen Borne, MD  Co-Surgeon: Marga Hoots, DO   Preoperative Diagnosis:  Sternal / Chest Wound  Postoperative Diagnosis:  Same   Procedure:  1. Debridement of Sternal / Chest wound  Anesthesia:  General Endotracheal   Clinical History/Surgical Indication:  The patient is a 64 year old gentleman history of hypertension, hyperlipidemia, ongoing heavy smoking with COPD, alcohol abuse, ischemic cardiomyopathy and coronary disease who underwent coronary artery bypass graft surgery by me on 08/05/2019. He had a slow postoperative course due to preoperative and postoperative atrial fibrillation ultimately requiring anticoagulation, heavy smoking with COPD and possible pneumonia on chest x-ray, and oxygen dependence. He was ultimately discharged on postop day 11. He continuedto smoke 1 pack/day after discharge and was noncompliant with sternal precautions and returnedto see me complaining of some right chest pain and was noted to have instability of the lower half of the sternum. The incision was healing well without signs of infection. He was taken back to the operating room on 09/20/2019 for sternal reconstruction.At the time of surgery he was found to have most of the sternal wires pulling through the left side of the sternum. The manubrium was slightly separated with the wires had not pulled through the manubrium. All the sternal wires were removed and the sternum was reconstructed using longitudinal KLS Martin sternal plates followed by sternal wire placement around the plates. The patient did well with that and wondered home the following day. He has continued to smoke and been noncompliant with sternal precautions [place] that he was having some drainage from the lower portion  of his chest incision and has noted some motion in the sternum. He was seen back in the office on Tuesday by our physician assistant and it was felt that he should be admitted for intravenous antibiotics for cellulitis and further evaluation of his sternum. The patient refused admission and left the office. Then yesterday he presented to the hospital in [facility] in the emergency room called our office and requested that the patient be transferred back to [facility]. He had a CT scan of the chest there which I reviewed and showed recurrent dehiscence of the sternum.  He was taken the operating room on 09/30/2019 and underwent removal of the sternal wires and dehisced sternal plates with debridement of the wound and placement of wound VAC.  Plastic surgery consultation was obtained and plans are made for further debridement and muscle flap reconstruction.  Preparation:  The patient was seen in the preoperative holding area and the correct patient, correct operation were confirmed with the patient after reviewing the medical record. The consent was signed by me. Preoperative antibiotics were given. The patient was taken back to the operating room and positioned supine on the operating room table. After being placed under general endotracheal anesthesia by the anesthesia team the neck, chest and abdomen were prepped with betadine soap and solution and draped in the usual sterile manner. A surgical time-out was taken and the correct patient and operative procedure were confirmed with the nursing and anesthesia staff.   Debridement of Sternal / Chest wound:  The remaining portions of the sternal plates and the 3 sternal wires through the manubrium were removed.  The wound was carefully debrided using curettes followed by irrigation.  The mediastinal tissues including the bone appeared viable with good bleeding.  The left hemisternum was in several pieces but again appeared viable.  Then the  remainder of the pectoralis major muscle flap reconstruction was performed by Dr. Ulice Bold with my assistance to provide adequate retraction and exposure and to prevent injury to the heart and mediastinal structures.  That portion of the procedure has been dictated separately by her.  At the conclusion of the procedure the patient was transported to the post anesthesia care unit in satisfactory and stable condition.

## 2019-10-17 NOTE — TOC Transition Note (Signed)
Transition of Care [facility]) - CM/SW Discharge Note   Patient Details  Name: Matthew [name] MRN: [id number] Date of Birth: [birth year]  [facility] CM/SW Contact:  Luiz Blare Phone Number: 10/17/2019, 2:24 PM   Clinical Narrative:     CSW set up patient transportation via [facility] to address on file. Per [employer], within transport radius.    Final next level of care: Home/Self Care Barriers to Discharge: No Barriers Identified   Patient Goals and CMS Choice Patient states their goals for this hospitalization and ongoing recovery are:: Establish new housing CMS Medicare.gov Compare Post Acute Care list provided to:: Patient Choice offered to / list presented to : Patient  Discharge Placement                    Patient and family notified of of transfer: 10/17/19  Discharge Plan and Services In-house Referral: Clinical Social Work                                   Social Determinants of Health (SDOH) Interventions     Readmission Risk Interventions Readmission Risk Prevention Plan 10/06/2019 09/22/2019 08/16/2019  Post Dischage Appt - Complete -  Medication Screening - Complete -  Transportation Screening Complete Complete Complete  PCP or Specialist Appt within 5-7 Days Complete - Complete  Home Care Screening Complete - Complete  Medication Review (RN CM) Referral to Pharmacy - Complete

## 2019-10-17 NOTE — Progress Notes (Signed)
Discharged home  by himself, transportation provided by SW. Belongings taken home

## 2019-10-17 NOTE — Discharge Instructions (Signed)
Discharge Instructions:  1. You may shower, please wash incisions daily with soap and water and keep dry.  If you wish to cover wounds with dressing you may do so but please keep clean and change daily.  No tub baths or swimming until incisions have completely healed.  If your incisions become red or develop any drainage please call our office at [phone] Change the Mepilex Border dressing every other day.   2. No Driving until cleared by Dr. Sharee Pimple  office and you are no longer using narcotic pain medications  3. Monitor your weight daily.. Please use the same scale and weigh at same time... If you gain 3-5lbs in 48 hours with associated lower extremity swelling, please contact our office at [phone]  4. Fever of 101.5 for at least 24 hours with no source, please contact our office at [phone]  5. Activity- up as tolerated, please walk at least 3 times per day.  Avoid strenuous activity, no lifting, pushing, or pulling with your arms over 8-10 lbs for a minimum of 6 weeks  6. If any questions or concerns arise, please do not hesitate to contact our office at [phone]   Prediabetes Eating Plan Prediabetes is a condition that causes blood sugar (glucose) levels to be higher than normal. This increases the risk for developing diabetes. In order to prevent diabetes from developing, your health care provider may recommend a diet and other lifestyle changes to help you:  Control your blood glucose levels.  Improve your cholesterol levels.  Manage your blood pressure. Your health care provider may recommend working with a diet and nutrition specialist (dietitian) to make a meal plan that is best for you. What are tips for following this plan? Lifestyle  Set weight loss goals with the help of your health care team. It is recommended that most people with prediabetes lose 7% of their current body weight.  Exercise for at least 30 minutes at least 5 days a week.  Attend a  support group or seek ongoing support from a mental health counselor.  Take over-the-counter and prescription medicines only as told by your health care provider. Reading food labels  Read food labels to check the amount of fat, salt (sodium), and sugar in prepackaged foods. Avoid foods that have: ? Saturated fats. ? Trans fats. ? Added sugars.  Avoid foods that have more than 300 milligrams (mg) of sodium per serving. Limit your daily sodium intake to less than 2,300 mg each day. Shopping  Avoid buying pre-made and processed foods. Cooking  Cook with olive oil. Do not use butter, lard, or ghee.  Bake, broil, grill, or boil foods. Avoid frying. Meal planning   Work with your dietitian to develop an eating plan that is right for you. This may include: ? Tracking how many calories you take in. Use a food diary, notebook, or mobile application to track what you eat at each meal. ? Using the glycemic index (GI) to plan your meals. The index tells you how quickly a food will raise your blood glucose. Choose low-GI foods. These foods take a longer time to raise blood glucose.  Consider following a Mediterranean diet. This diet includes: ? Several servings each day of fresh fruits and vegetables. ? Eating fish at least twice a week. ? Several servings each day of whole grains, beans, nuts, and seeds. ? Using olive oil instead of other fats. ? Moderate alcohol consumption. ? Eating small amounts of red meat and whole-fat dairy.  If  you have high blood pressure, you may need to limit your sodium intake or follow a diet such as the DASH eating plan. DASH is an eating plan that aims to lower high blood pressure. What foods are recommended? The items listed below may not be a complete list. Talk with your dietitian about what dietary choices are best for you. Grains Whole grains, such as whole-wheat or whole-grain breads, crackers, cereals, and pasta. Unsweetened oatmeal. Bulgur. Barley.  Quinoa. Brown rice. Corn or whole-wheat flour tortillas or taco shells. Vegetables Lettuce. Spinach. Peas. Beets. Cauliflower. Cabbage. Broccoli. Carrots. Tomatoes. Squash. Eggplant. Herbs. Peppers. Onions. Cucumbers. Brussels sprouts. Fruits Berries. Bananas. Apples. Oranges. Grapes. Papaya. Mango. Pomegranate. Kiwi. Grapefruit. Cherries. Meats and other protein foods Seafood. Poultry without skin. Lean cuts of pork and beef. Tofu. Eggs. Nuts. Beans. Dairy Low-fat or fat-free dairy products, such as yogurt, cottage cheese, and cheese. Beverages Water. Tea. Coffee. Sugar-free or diet soda. Seltzer water. Lowfat or no-fat milk. Milk alternatives, such as soy or almond milk. Fats and oils Olive oil. Canola oil. Sunflower oil. Grapeseed oil. Avocado. Walnuts. Sweets and desserts Sugar-free or low-fat pudding. Sugar-free or low-fat ice cream and other frozen treats. Seasoning and other foods Herbs. Sodium-free spices. Mustard. Relish. Low-fat, low-sugar ketchup. Low-fat, low-sugar barbecue sauce. Low-fat or fat-free mayonnaise. What foods are not recommended? The items listed below may not be a complete list. Talk with your dietitian about what dietary choices are best for you. Grains Refined white flour and flour products, such as bread, pasta, snack foods, and cereals. Vegetables Canned vegetables. Frozen vegetables with butter or cream sauce. Fruits Fruits canned with syrup. Meats and other protein foods Fatty cuts of meat. Poultry with skin. Breaded or fried meat. Processed meats. Dairy Full-fat yogurt, cheese, or milk. Beverages Sweetened drinks, such as sweet iced tea and soda. Fats and oils Butter. Lard. Ghee. Sweets and desserts Baked goods, such as cake, cupcakes, pastries, cookies, and cheesecake. Seasoning and other foods Spice mixes with added salt. Ketchup. Barbecue sauce. Mayonnaise. Summary  To prevent diabetes from developing, you may need to make diet and other  lifestyle changes to help control blood sugar, improve cholesterol levels, and manage your blood pressure.  Set weight loss goals with the help of your health care team. It is recommended that most people with prediabetes lose 7 percent of their current body weight.  Consider following a Mediterranean diet that includes plenty of fresh fruits and vegetables, whole grains, beans, nuts, seeds, fish, lean meat, low-fat dairy, and healthy oils. This information is not intended to replace advice given to you by your health care provider. Make sure you discuss any questions you have with your health care provider. Document Revised: 07/02/2018 Document Reviewed: 05/14/2016 Elsevier Patient Education  2020 [employer].

## 2019-10-17 NOTE — TOC Progression Note (Signed)
Transition of Care [facility]) - Progression Note    Patient Details  Name: Matthew [name] MRN: [id number] Date of Birth: May 24, 1955  [facility] CM/SW Contact  Leone Haven, RN Phone Number: 10/17/2019, 12:45 PM  Clinical Narrative:    CSW is helping patient with transportation home today.  He has no other needs.   Expected Discharge Plan: Home w Home Health Services Barriers to Discharge: Continued Medical Work up, Family Issues  Expected Discharge Plan and Services Expected Discharge Plan: Home w Home Health Services In-house Referral: Clinical Social Work     Living arrangements for the past 2 months: Mobile Home Expected Discharge Date: 10/17/19                                     Social Determinants of Health (SDOH) Interventions    Readmission Risk Interventions Readmission Risk Prevention Plan 10/06/2019 09/22/2019 08/16/2019  Post Dischage Appt - Complete -  Medication Screening - Complete -  Transportation Screening Complete Complete Complete  PCP or Specialist Appt within 5-7 Days Complete - Complete  Home Care Screening Complete - Complete  Medication Review (RN CM) Referral to Pharmacy - Complete

## 2019-10-17 NOTE — Progress Notes (Signed)
12 Days Post-Op  Subjective: Patient is sitting in chair today with no complaints. Small amount of serosanguinous fluid in drain bulb. Incision is healing well, c/d/i. Dermabond present. No signs of infection, redness, drainage, seroma/hematoma. Denies F, CP, SOB, N/V.   Objective: Vital signs in last 24 hours: Temp:  [97.6 F (36.4 C)-98.8 F (37.1 C)] 98.8 F (37.1 C) (07/26 0721) Pulse Rate:  [55-67] 55 (07/26 0433) Resp:  [11-17] 16 (07/26 0433) BP: (103-115)/(61-75) 115/61 (07/26 0433) SpO2:  [94 %-98 %] 96 % (07/26 0433) Last BM Date: 10/15/19  Intake/Output from previous day: 07/25 0701 - 07/26 0700 In: 1330 [P.O.:1330] Out: 3970 [Urine:3950; Drains:20] Intake/Output this shift: No intake/output data recorded.  General appearance: alert, cooperative and no distress Head: Normocephalic, without obvious abnormality, atraumatic Eyes: EOMs intact Resp: nonlabored Chest wall: no tenderness, Incisions healing well, c/d/i. No sings of infection, redness, drainage, seroma/hematoma. Dermabond present.  Lab Results:  CBC    Component Value Date/Time   WBC 8.4 10/14/2019 0217   RBC 3.66 (L) 10/14/2019 0217   HGB 10.3 (L) 10/14/2019 0217   HCT 32.3 (L) 10/14/2019 0217   PLT 460 (H) 10/14/2019 0217   MCV 88.3 10/14/2019 0217   MCH 28.1 10/14/2019 0217   MCHC 31.9 10/14/2019 0217   RDW 13.8 10/14/2019 0217   BMET No results for input(s): NA, K, CL, CO2, GLUCOSE, BUN, CREATININE, CALCIUM in the last 72 hours. PT/INR No results for input(s): LABPROT, INR in the last 72 hours. ABG No results for input(s): PHART, HCO3 in the last 72 hours.  Invalid input(s): PCO2, PO2  Studies/Results: No results found.  Anti-infectives: Anti-infectives (From admission, onward)   Start     Dose/Rate Route Frequency Ordered Stop   10/05/19 1412  polymyxin B 500,000 Units, bacitracin 50,000 Units in sodium chloride 0.9 % 500 mL irrigation  Status:  Discontinued          As needed  10/05/19 1412 10/05/19 1605   10/05/19 0600  ceFAZolin (ANCEF) IVPB 2g/100 mL premix        2 g 200 mL/hr over 30 Minutes Intravenous To Short Stay 10/04/19 1225 10/05/19 1428   10/04/19 0000  vancomycin (VANCOREADY) IVPB 750 mg/150 mL        750 mg 150 mL/hr over 60 Minutes Intravenous Every 12 hours 10/03/19 1430 10/11/19 2359   09/29/19 1200  vancomycin (VANCOCIN) IVPB 1000 mg/200 mL premix  Status:  Discontinued        1,000 mg 200 mL/hr over 60 Minutes Intravenous Every 12 hours 09/28/19 2353 10/03/19 1430   09/29/19 1000  cefTAZidime (FORTAZ) 2 g in sodium chloride 0.9 % 100 mL IVPB  Status:  Discontinued        2 g 200 mL/hr over 30 Minutes Intravenous Every 8 hours 09/29/19 0916 10/03/19 1352   09/29/19 0000  vancomycin (VANCOREADY) IVPB 1500 mg/300 mL        1,500 mg 150 mL/hr over 120 Minutes Intravenous NOW 09/28/19 2353 09/29/19 0259      Assessment/Plan: s/p Procedure(s): IRRIGATION AND DEBRIDEMENT OF STERNOCLAVICULAR JOINT-STERNUM AND RIBS ADJACENT TISSUE TRANSFER/TISSUE REARRANGEMENT, ABRA MUSCLE FLAP IRRIGATION AND DEBRIDEMENT STERNUM WOUND POSSIBLE PLACEMENT OF A CELL Drain removed. Cover drain site with vaseline and gauze for the next few days until closed. No signs of infection or redness.   Stable for discharge from plastic surgery perspective. Change Mepilex boarder dressing on chest incision every other day until follow up in office.  May shower avoiding direct water  spray on incision until follow up. Follow up in office in ~ 2 weeks. Call office with any questions/concerns.   LOS: 19 days    Eldridge Abrahams, [place] 10/17/2019

## 2019-10-18 ENCOUNTER — Telehealth: Payer: Self-pay | Admitting: Licensed Clinical Social Worker

## 2019-10-18 ENCOUNTER — Other Ambulatory Visit: Payer: Self-pay

## 2019-10-18 MED ORDER — ATORVASTATIN CALCIUM 40 MG PO TABS: 40.0000 mg | ORAL_TABLET | Freq: Every day | ORAL | 3 refills | Status: DC

## 2019-10-18 MED ORDER — LEVOTHYROXINE SODIUM 100 MCG PO TABS: 100.0000 ug | ORAL_TABLET | Freq: Every day | ORAL | 1 refills | Status: AC

## 2019-10-18 MED ORDER — METOPROLOL TARTRATE 25 MG PO TABS: 12.5000 mg | ORAL_TABLET | Freq: Two times a day (BID) | ORAL | 1 refills | Status: AC

## 2019-10-18 NOTE — Progress Notes (Signed)
Reordered patient's medication per patient's request.  Does not have his prescriptions and is unable to get his medications and take them.  Dr. Laneta Simmers aware. Patient was just discharged from the hospital yesterday, 10/17/19, s/p  CABG with sternal wound reconstruction, and flap by Dr. Laneta Simmers.  He was kicked out of the home he was living in with his ex-wife.  Patient is now living temporarily with his sister in [place].

## 2019-10-18 NOTE — Telephone Encounter (Signed)
CSW received referral to assist patient with multiple needs. Patient was released from the hospital yesterday s/p CABG and then sternal debridement and muscle flap. Patient was reportedly kicked out of his home where he resided with his SO. "She threw my clothes and meds on the ground and they broke up". Patient is now staying with his sister in [place], [place] and out of medications. CSW made arrangements with staff to call in meds to local [employer] and assistance with gift cards to cover expenses sent via [employer]. Patient's sister states he can stay with her until the end of the first week in August but will need housing and transportation after that to his follow up appointments. CSW will continue to follow for housing options and provide transportation to appointment s as we get closer. Patient grateful for the support and assistance. Lasandra Beech, LCSW, CCSW-MCS [phone]

## 2019-10-20 ENCOUNTER — Telehealth: Payer: Self-pay | Admitting: Licensed Clinical Social Worker

## 2019-10-20 NOTE — Telephone Encounter (Signed)
CSW received call from patient this morning stating that he has located a camper to purchase. He states he plans to park the camper on his sisters property which will resolve his housing issue. CSW shared that I called multiple places and reached out to [employer] with no available housing options for patient at this time so this is great news. Patient also indicated that he has transportation to his appointment on August 4th to [facility]. CSW confirmed that [employer] gift cards had been sent to obtain needed items as discussed previously. CSW provided patient with number for future need and will be available as needed. Lasandra Beech, LCSW, CCSW-MCS [phone]

## 2019-10-24 ENCOUNTER — Telehealth: Payer: Self-pay | Admitting: Surgical

## 2019-10-24 NOTE — Telephone Encounter (Signed)
Patient called to say he has what appears to be fishing line coming out of his sternum and he isn't sure if that is what was used to close the site. It's red and sore under the area and below the stomach. Please call to advise if this is normal and what he should do between now and his appt on Wed.

## 2019-10-25 ENCOUNTER — Other Ambulatory Visit: Payer: Self-pay | Admitting: Surgery

## 2019-10-25 DIAGNOSIS — Z951 Presence of aortocoronary bypass graft: Secondary | ICD-10-CM

## 2019-10-25 NOTE — Telephone Encounter (Signed)
Returned patients call. Advised him what he is seeing is the sutures that either Dr. Ulice Bold or Dr. Laneta Simmers used to close his incision.  Patient stated it is red around the area of the tape. It appears not to be infected, no fever, chills, vomiting or diarrhea. The redness is probably coming from the tape. Advised him to use another source of tape. Patient will wait to see Dr. Laneta Simmers tomorrow at 1:30. I reminded him of his appointment with [place] at 2:20. Patient understood and agreed

## 2019-10-26 ENCOUNTER — Ambulatory Visit (INDEPENDENT_AMBULATORY_CARE_PROVIDER_SITE_OTHER): Payer: Self-pay | Admitting: Surgery

## 2019-10-26 ENCOUNTER — Other Ambulatory Visit: Payer: Self-pay

## 2019-10-26 ENCOUNTER — Encounter: Payer: Self-pay | Admitting: Surgery

## 2019-10-26 ENCOUNTER — Ambulatory Visit
Admission: RE | Admit: 2019-10-26 | Discharge: 2019-10-26 | Disposition: A | Discharge status: Home / Self Care | Payer: Medicare Other | Source: Ambulatory Visit | Attending: Surgery | Admitting: Surgery

## 2019-10-26 ENCOUNTER — Ambulatory Visit (INDEPENDENT_AMBULATORY_CARE_PROVIDER_SITE_OTHER): Payer: Medicare Other | Admitting: Surgical

## 2019-10-26 ENCOUNTER — Encounter: Payer: Self-pay | Admitting: Surgical

## 2019-10-26 ENCOUNTER — Other Ambulatory Visit: Payer: Self-pay | Admitting: Physician Assistant

## 2019-10-26 VITALS — BP 140/78 | HR 63 | Temp 97.7°F | Resp 20 | Ht 66.0 in | Wt 182.0 lb

## 2019-10-26 VITALS — BP 123/67 | HR 63 | Temp 98.0°F | Ht 67.0 in

## 2019-10-26 DIAGNOSIS — I2510 Atherosclerotic heart disease of native coronary artery without angina pectoris: Secondary | ICD-10-CM

## 2019-10-26 DIAGNOSIS — T8132XD Disruption of internal operation (surgical) wound, not elsewhere classified, subsequent encounter: Secondary | ICD-10-CM

## 2019-10-26 DIAGNOSIS — Z951 Presence of aortocoronary bypass graft: Secondary | ICD-10-CM

## 2019-10-26 DIAGNOSIS — Z09 Encounter for follow-up examination after completed treatment for conditions other than malignant neoplasm: Secondary | ICD-10-CM

## 2019-10-26 MED ORDER — DOXYCYCLINE HYCLATE 100 MG PO TABS
100.0000 mg | ORAL_TABLET | Freq: Two times a day (BID) | ORAL | 0 refills | Status: AC
Start: 2019-10-26 — End: 2019-10-31

## 2019-10-26 NOTE — Progress Notes (Signed)
° °  Subjective:     Patient ID: Matthew [name], male    DOB: 07-22-1955, 64 y.o.   MRN: [id number]  Chief Complaint  Patient presents with   Follow-up    HPI: Patient is a 64 year old male who underwent debridement of sternal wound, right pectoralis turnover flap, placement of ACell by Dr. Ulice Bold on 10/05/2019.   Patient underwent CABG on 08/05/2019 and subsequently KLS sternal plating and sternal wire plating on 09/20/2019.  Patient postoperatively was smoking about 1 pack/day.  Patient was discharged from the hospital on 10/17/2019. Patient saw CT surgery today, chest x-ray performed showed mild cardiac enlargement, no edema or airspace opacity, postoperative changes noted.  Patient reports he is feeling well.  He reports that he did notice some redness around his incision and he had marked with a black marker.  The redness has receded since his markings.  He is not having any fevers, chills, nausea, vomiting, chest pain, shortness of breath.  He does have a little bit of tenderness to palpation of the left chest wall.   Review of Systems  Constitutional: Negative for chills, diaphoresis, fatigue and fever.  Respiratory: Negative.   Cardiovascular: Negative.   Gastrointestinal: Negative for nausea and vomiting.  Skin: Positive for color change. Negative for wound.     Objective:   Vital Signs BP 123/67 (BP Location: Right Arm, Patient Position: Sitting, Cuff Size: Large)    Pulse 63    Temp 98 F (36.7 C) (Oral)    Ht 5\' 7"  (1.702 m)    BMI 28.51 kg/m  Vital Signs and Nursing Note Reviewed Physical Exam Constitutional:      General: He is not in acute distress.    Appearance: Normal appearance. He is not ill-appearing.  HENT:     Head: Normocephalic.  Pulmonary:     Effort: Pulmonary effort is normal.  Chest:       Comments: Sternal incision intact without any dehiscence. Abdominal:     General: Abdomen is flat. There is no distension.  Neurological:     Mental  Status: He is alert.     Assessment/Plan:     ICD-10-CM   1. Disruption or dehiscence of closure of sternum or sternotomy, subsequent encounter  T81.32XD     Patient is overall doing well, incision is intact without any dehiscence.  He does have some erythema at the distal aspect of the incision.  It does not appear cellulitic.  Recommend applying Mepilex border dressing to sternal incision.  35 cc of serosanguineous fluid was drained from the right chest just lateral to sternal incision using a sterile technique.  Dr. was present.  5 days of doxycycline sent to pharmacy for redness, recommend calling if redness does not improve or worsens.  Recommend calling with questions or concerns. Follow up scheduled for 1 week for reevaluation of redness and swelling.  Patient reports that he may not have transportation in 1 week, I discussed with him that if he would like we can do a telemetry visit but ideally I would need to see him in person to evaluate the redness and swelling.  Pictures were obtained of the patient and placed in the chart with the patient's or guardian's permission.   Ulice Bold Alexiz Cothran, PA-C 10/26/2019, 4:00 PM

## 2019-10-26 NOTE — Progress Notes (Addendum)
HPI:  The patient is a 64 year old gentleman with a history of hypertension, hyperlipidemia, heavy smoking with COPD, and alcohol abuse who underwent coronary bypass surgery by me on 08/05/2019.  He continued to smoke 1 pack/day after discharge and was noncompliant with sternal precautions and return to see me with instability of the lower half of his sternum.  He was taken back the operating room on 09/20/2019 for sternal reconstruction using KLS plates and sternal wires.  He did well and went home the next day but continued to smoke and was noncompliant with sternal precautions and developed some drainage from the lower portion of the chest incision with instability.  A follow-up chest x-ray showed dehiscence of the sternal plates.  He was taken back the operating room for removal of the sternal hardware and debridement of the wound.  There is no active infection.  He was treated with a wound VAC and evaluated by plastic surgery.  He subsequently underwent debridement of the sternal wound and right pectoralis muscle turnover flap by Dr. Ulice Bold on 10/17/2019.  Since discharge he has been living with his sister near [facility].  He denies any fever or chills.  He said he has been feeling well overall.  He is smoking about 1/2 pack of cigarettes per day.  He has been doing dressing changes to the chest incision as well as wet-to-dry dressing changes to the left lower extremity saphenous vein harvest wound.  His pain is well controlled.  He has not noted any drainage from the chest incision.  He says that he discontinued the Lipitor prescribed at the time of discharge because he developed lower extremity muscle pain or weakness.  This has resolved since he stopped the Lipitor.    Current Outpatient Medications  Medication Sig Dispense Refill  . levothyroxine (SYNTHROID) 100 MCG tablet Take 1 tablet (100 mcg total) by mouth daily at 6 (six) AM. 30 tablet 1  . metoprolol tartrate  (LOPRESSOR) 25 MG tablet Take 0.5 tablets (12.5 mg total) by mouth 2 (two) times daily. 30 tablet 1  . acetaminophen (TYLENOL) 325 MG tablet Take 2 tablets (650 mg total) by mouth every 6 (six) hours as needed for mild pain. (Patient not taking: Reported on 10/26/2019)    . aspirin EC 81 MG EC tablet Take 1 tablet (81 mg total) by mouth daily. Swallow whole. 30 tablet 11  . doxycycline (VIBRA-TABS) 100 MG tablet Take 1 tablet (100 mg total) by mouth 2 (two) times daily for 5 days. 10 tablet 0  . nicotine (NICODERM CQ - DOSED IN MG/24 HOURS) 14 mg/24hr patch Place 1 patch (14 mg total) onto the skin daily. (Patient not taking: Reported on 10/26/2019) 28 patch 0   No current facility-administered medications for this visit.     Physical Exam: BP 140/78   Pulse 63   Temp 97.7 F (36.5 C) (Skin)   Resp 20   Ht 5\' 6"  (1.676 m)   Wt 182 lb (82.6 kg)   SpO2 98% Comment: RA  BMI 29.38 kg/m  He looks well. There is a dressing over the chest incision which I did not remove since he is seeing plastic surgery after this visit.  There is no significant erythema or tenderness of the chest wall.  There does not appear to be any fluid collections.  The dressing is dry.   There is a dressing over the left lower leg vein harvest wound which she said is almost completely healed.  This was just changed.  Diagnostic Tests:  CLINICAL DATA:  Status post coronary artery bypass grafting. Tobacco use.  EXAM: CHEST - 2 VIEW  COMPARISON:  October 08, 2019  FINDINGS: Lungs are clear. Heart is mildly enlarged with pulmonary vascularity normal. No adenopathy. No pneumothorax. Status post coronary artery bypass grafting. Postoperative change lower cervical region.  IMPRESSION: Mild cardiac enlargement. No edema or airspace opacity. Postoperative changes noted.   Electronically Signed   By: Bretta Bang III M.D.   On: 10/26/2019 13:16   Impression:  Overall he appears to be doing well  following muscle flap reconstruction of his sternal wound.  I strongly advised him to discontinue smoking and he said that he understands the risks of nonhealing wounds, further atherosclerosis, lung cancer, and COPD.  Asked not do any lifting, pushing, or pulling for at least the next 3 months to allow this to heal.  He has an abnormal lipid profile with an LDL of 160 but did not tolerate Lipitor.  He is going to discuss this with cardiology to decide on ultimate treatment.  He also had newly diagnosed hypothyroidism at the time of his initial presentation with a TSH of 13.9.  He was started on Synthroid and his last TSH on 10/01/2019 was 2.7.  He will need to continue to follow-up with this with his primary physician.  Plan:  He is going to see plastic surgery after this visit.  I would like to see him back in the office at the same time as his next plastic surgery visit for follow-up since he lives a long distance away.  He is going to schedule a follow-up visit with cardiology and will discuss treatment of his lipids at that time.   Alleen Borne, MD [facility] [phone]

## 2019-10-31 ENCOUNTER — Other Ambulatory Visit: Payer: Self-pay | Admitting: Surgery

## 2019-11-01 ENCOUNTER — Ambulatory Visit: Payer: Medicare Other | Admitting: Plastic Surgery

## 2019-11-03 ENCOUNTER — Ambulatory Visit: Payer: Medicare Other | Admitting: Surgical

## 2019-11-09 ENCOUNTER — Ambulatory Visit: Payer: Medicare Other | Admitting: Surgery

## 2019-11-15 ENCOUNTER — Ambulatory Visit: Payer: Self-pay | Admitting: Pharmacist

## 2019-11-29 ENCOUNTER — Other Ambulatory Visit: Payer: Self-pay | Admitting: Surgery

## 2019-11-29 ENCOUNTER — Telehealth: Payer: Self-pay | Admitting: Plastic Surgery

## 2019-11-29 DIAGNOSIS — Z951 Presence of aortocoronary bypass graft: Secondary | ICD-10-CM

## 2019-11-29 NOTE — Telephone Encounter (Signed)
Called and [facility] @ 4:48pm for the patient regarding the message below.  Informed the patient that per Adalberto Cole he will need to keep the appointment tomorrow with Dr. Laneta Simmers tomorrow at 3pm.  Dr. Laneta Simmers should let him know if he needs to come back to see Dr. Ulice Bold.  Informed the patient that I give him a call in the morning to make sure he received the message.//AB/CMA

## 2019-11-29 NOTE — Telephone Encounter (Signed)
Patient had muscle flap with Dr. Ulice Bold a couple of months ago and he said that he is hurting real bad on the left side behind his heart especially when he takes deep breaths and he isn't sure if it's fluid build up or not. He went to ED last night and they did xrays, CT scan. He has appt with Dr. Laneta Simmers tomorrow at 3pm. Please call him to advise if he needs to come in.

## 2019-11-30 ENCOUNTER — Other Ambulatory Visit: Payer: Self-pay

## 2019-11-30 ENCOUNTER — Ambulatory Visit (INDEPENDENT_AMBULATORY_CARE_PROVIDER_SITE_OTHER): Payer: Self-pay | Admitting: Surgery

## 2019-11-30 ENCOUNTER — Encounter: Payer: Self-pay | Admitting: Surgery

## 2019-11-30 VITALS — BP 146/78 | HR 65 | Temp 97.7°F | Resp 20 | Ht 67.0 in | Wt 192.0 lb

## 2019-11-30 DIAGNOSIS — I2510 Atherosclerotic heart disease of native coronary artery without angina pectoris: Secondary | ICD-10-CM

## 2019-11-30 DIAGNOSIS — Z951 Presence of aortocoronary bypass graft: Secondary | ICD-10-CM

## 2019-11-30 DIAGNOSIS — Z09 Encounter for follow-up examination after completed treatment for conditions other than malignant neoplasm: Secondary | ICD-10-CM

## 2019-11-30 DIAGNOSIS — R0789 Other chest pain: Secondary | ICD-10-CM

## 2019-11-30 MED ORDER — FUROSEMIDE 40 MG PO TABS: 40.0000 mg | ORAL_TABLET | Freq: Every day | ORAL | 0 refills | Status: AC

## 2019-11-30 MED ORDER — POTASSIUM CHLORIDE ER 10 MEQ PO TBCR
20.0000 meq | EXTENDED_RELEASE_TABLET | Freq: Every day | ORAL | 0 refills | Status: AC
Start: 2019-11-30 — End: ?

## 2019-11-30 NOTE — Progress Notes (Signed)
HPI:  Mr. Dismuke returns today for follow-up status post coronary bypass graft surgery on 08/05/2019 followed by sternal disruption caused by ongoing heavy smoking and heavy coughing requiring reconstruction on 09/20/2019.  He subsequently disrupted the reconstruction with hardware and required return to the operating room for removal of the sternal hardware and underwent debridement of the sternal wound and a right pectoralis muscle turnover flap by Dr. Ulice Bold on 10/17/2019.  I last saw him on 10/26/2019 and he was doing well.  He was seen the same day by plastic surgery and had 35 cc of serosanguineous fluid draining from beneath the right chest flap and was placed on 5 days of doxycycline for redness.  He says that he was doing well until about 3 days ago when he began having left-sided chest discomfort that he describes as feeling like a knife stuck in his chest.  He also developed lower extremity edema and some shortness of breath.  He denies any trauma.  He continues to smoke 1 pack of cigarettes per day and has had some coughing.  He was seen in the emergency room yesterday at [facility] and I reviewed the records on care everywhere.  He had a minimally elevated troponin level.  CTA of the chest was negative for pulmonary embolism.  The pectoralis muscle flap was filling the gap between his sternum and there were no fluid collections seen.  There is no sign of infection.  There is no pneumonia or pleural effusion.  There is no significant pericardial effusion.  He was also noted to have a proBNP of 3770 suggesting some congestive heart failure and his ejection fraction at time of surgery was 30 to 35%.  He was treated with Lasix yesterday with diuresis and improvement in his lower extremity edema and breathing.  He was told to come for his appointment with me today.  He said that he has made an appointment with a cardiologist near where he is living around [facility] but has not  gotten to see that physician yet.  He cannot remember the physician's name.  Current Outpatient Medications  Medication Sig Dispense Refill   acetaminophen (TYLENOL) 325 MG tablet Take 2 tablets (650 mg total) by mouth every 6 (six) hours as needed for mild pain.     aspirin EC 81 MG EC tablet Take 1 tablet (81 mg total) by mouth daily. Swallow whole. 30 tablet 11   levothyroxine (SYNTHROID) 100 MCG tablet Take 1 tablet (100 mcg total) by mouth daily at 6 (six) AM. 30 tablet 1   metoprolol tartrate (LOPRESSOR) 25 MG tablet Take 0.5 tablets (12.5 mg total) by mouth 2 (two) times daily. 30 tablet 1   nicotine (NICODERM CQ - DOSED IN MG/24 HOURS) 14 mg/24hr patch Place 1 patch (14 mg total) onto the skin daily. 28 patch 0   furosemide (LASIX) 40 MG tablet Take 1 tablet (40 mg total) by mouth daily. 30 tablet 0   potassium chloride (KLOR-CON) 10 MEQ tablet Take 2 tablets (20 mEq total) by mouth daily. 60 tablet 0   No current facility-administered medications for this visit.     Physical Exam: BP (!) 146/78 (BP Location: Right Arm, Patient Position: Sitting, Cuff Size: Normal)    Pulse 65    Temp 97.7 F (36.5 C)    Resp 20    Ht 5\' 7"  (1.702 m)    Wt 192 lb (87.1 kg)    SpO2 95% Comment: RA  BMI 30.07 kg/m  He looks well. Cardiac exam shows a regular rate and rhythm with normal heart sounds.  There is no murmur.  The chest incision is well-healed.  The muscle flap looks normal with no fluctuance.  There is no significant erythema or tenderness.  There is no drainage.  The chest wall is nontender. Lung exam reveals expiratory wheezing. There is mild to moderate bilateral lower extremity edema to the knees.   Impression:  The pectoralis muscle flap appears to be healing in well with no signs of fluid collection on the CT report from [facility] yesterday.  I do not see any signs of infection on exam.  I suspect that the left-sided chest discomfort may be related to his prior heart  surgery with subsequent sternal fractures and debridement with persistent coughing from smoking.  He is not usually the most compliant patient.  I advised him to use ibuprofen as needed for the pain.  I do not see any abnormality to account for the pain based on the CT scan report.  It does not sound like anginal pain.  He does have signs of congestive heart failure on exam with moderately reduced ejection fraction on his prior echocardiogram.  I wrote him prescription today for Lasix 40 mg daily and potassium chloride 20 mEq daily and advised him to follow-up with his new cardiologist in [place].  He will need continued attention to fluid management, low-sodium diet, and medical optimization of his congestive heart failure.  Plan:  He will return to see me as needed if he develops any problems with his chest incision.   Alleen Borne, MD [facility] [phone]

## 2019-12-01 ENCOUNTER — Other Ambulatory Visit: Payer: Self-pay | Admitting: Surgery

## 2019-12-01 NOTE — Telephone Encounter (Signed)
Tried calling the patient back (X2) and no answer.//AB/CMA

## 2019-12-02 ENCOUNTER — Other Ambulatory Visit: Payer: Self-pay | Admitting: Physician Assistant

## 2019-12-27 ENCOUNTER — Other Ambulatory Visit: Payer: Self-pay | Admitting: Surgery

## 2020-01-23 ENCOUNTER — Other Ambulatory Visit: Payer: Self-pay | Admitting: Surgery

## 2020-02-21 ENCOUNTER — Other Ambulatory Visit: Payer: Self-pay | Admitting: Surgery

## 2022-03-19 IMAGING — CR DG CHEST 2V
2 series · 2 of 2 positions shown · non-contrast
Comparison: 08/09/2019.

CLINICAL DATA: Cough.

EXAM:
CHEST - 2 VIEW

[chest pa]
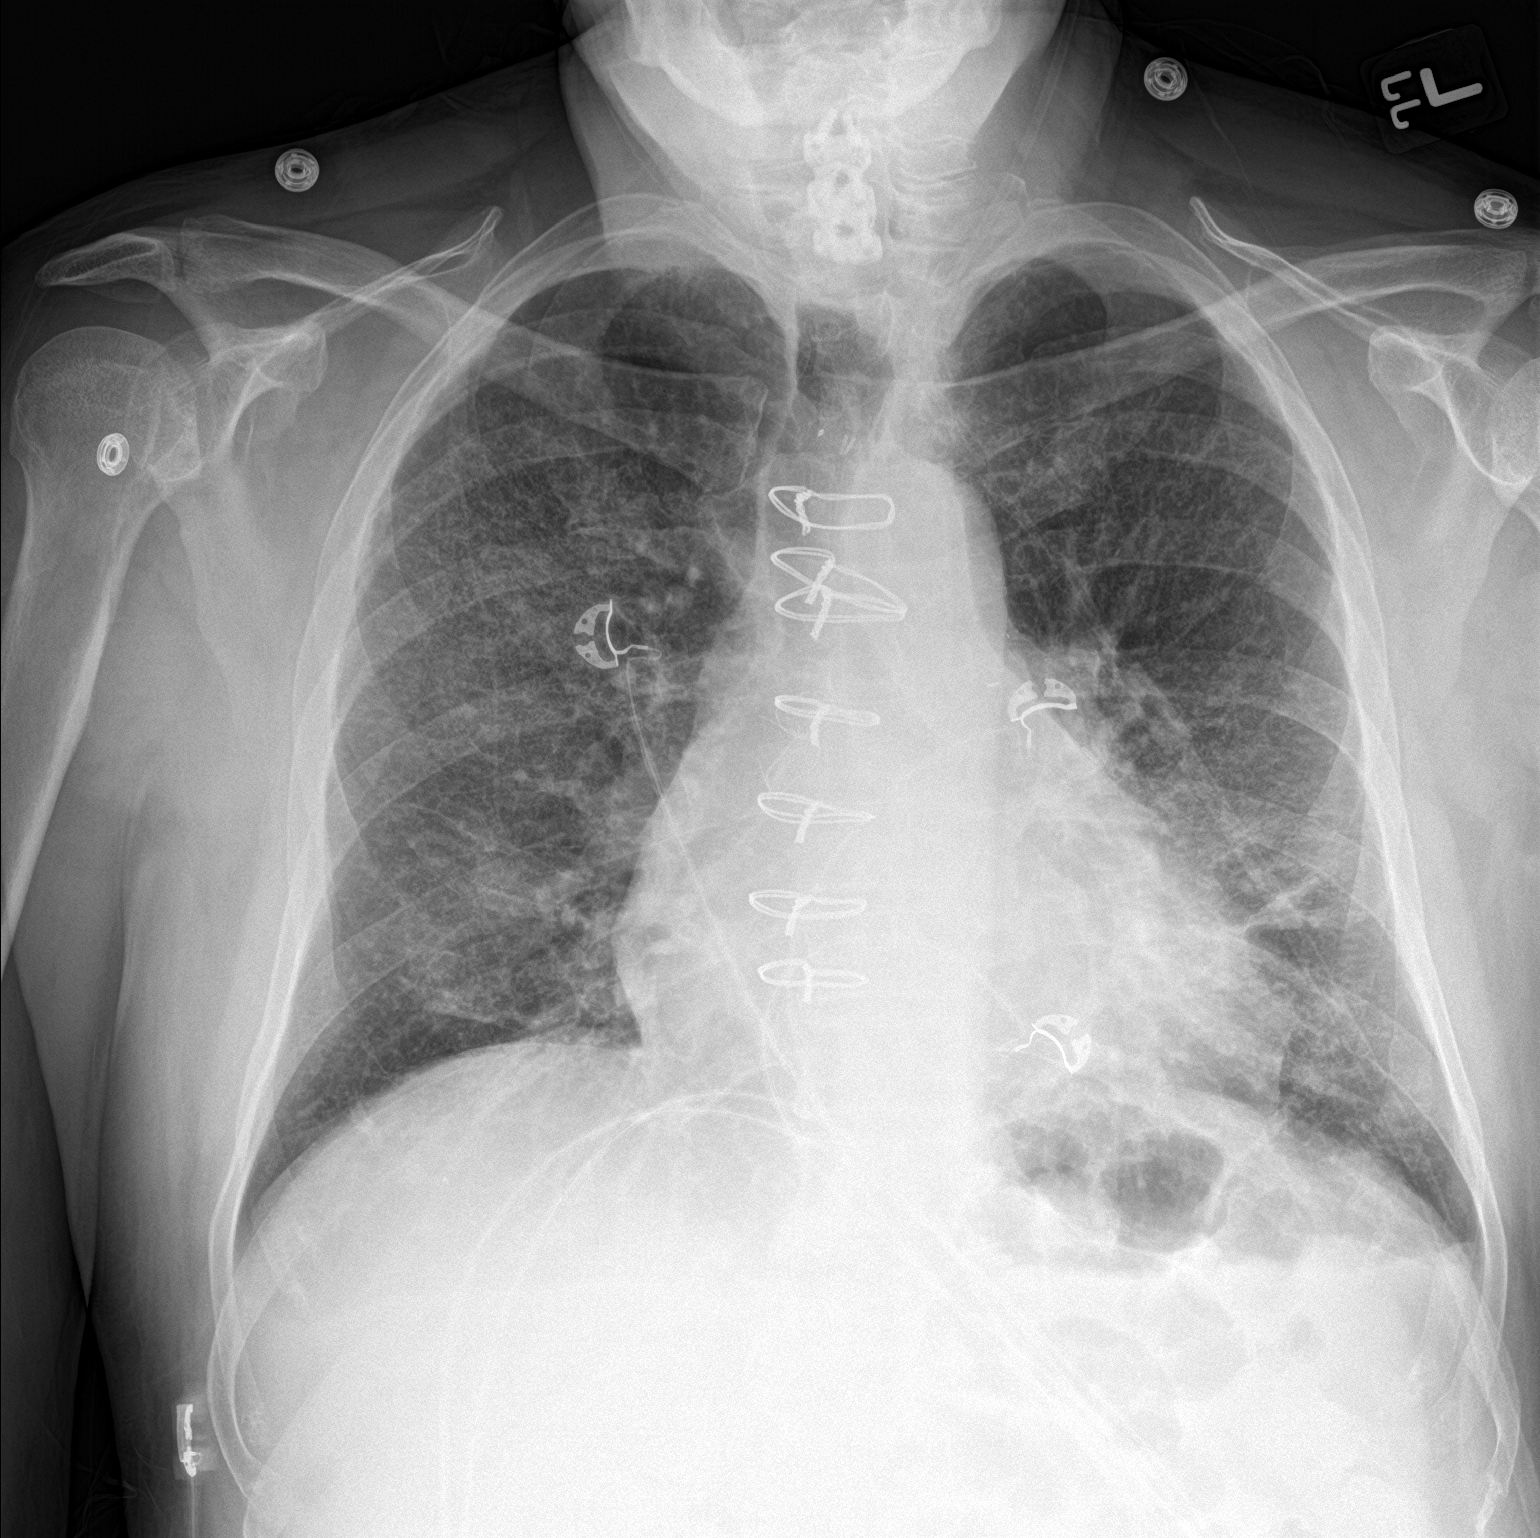

[chest lat]
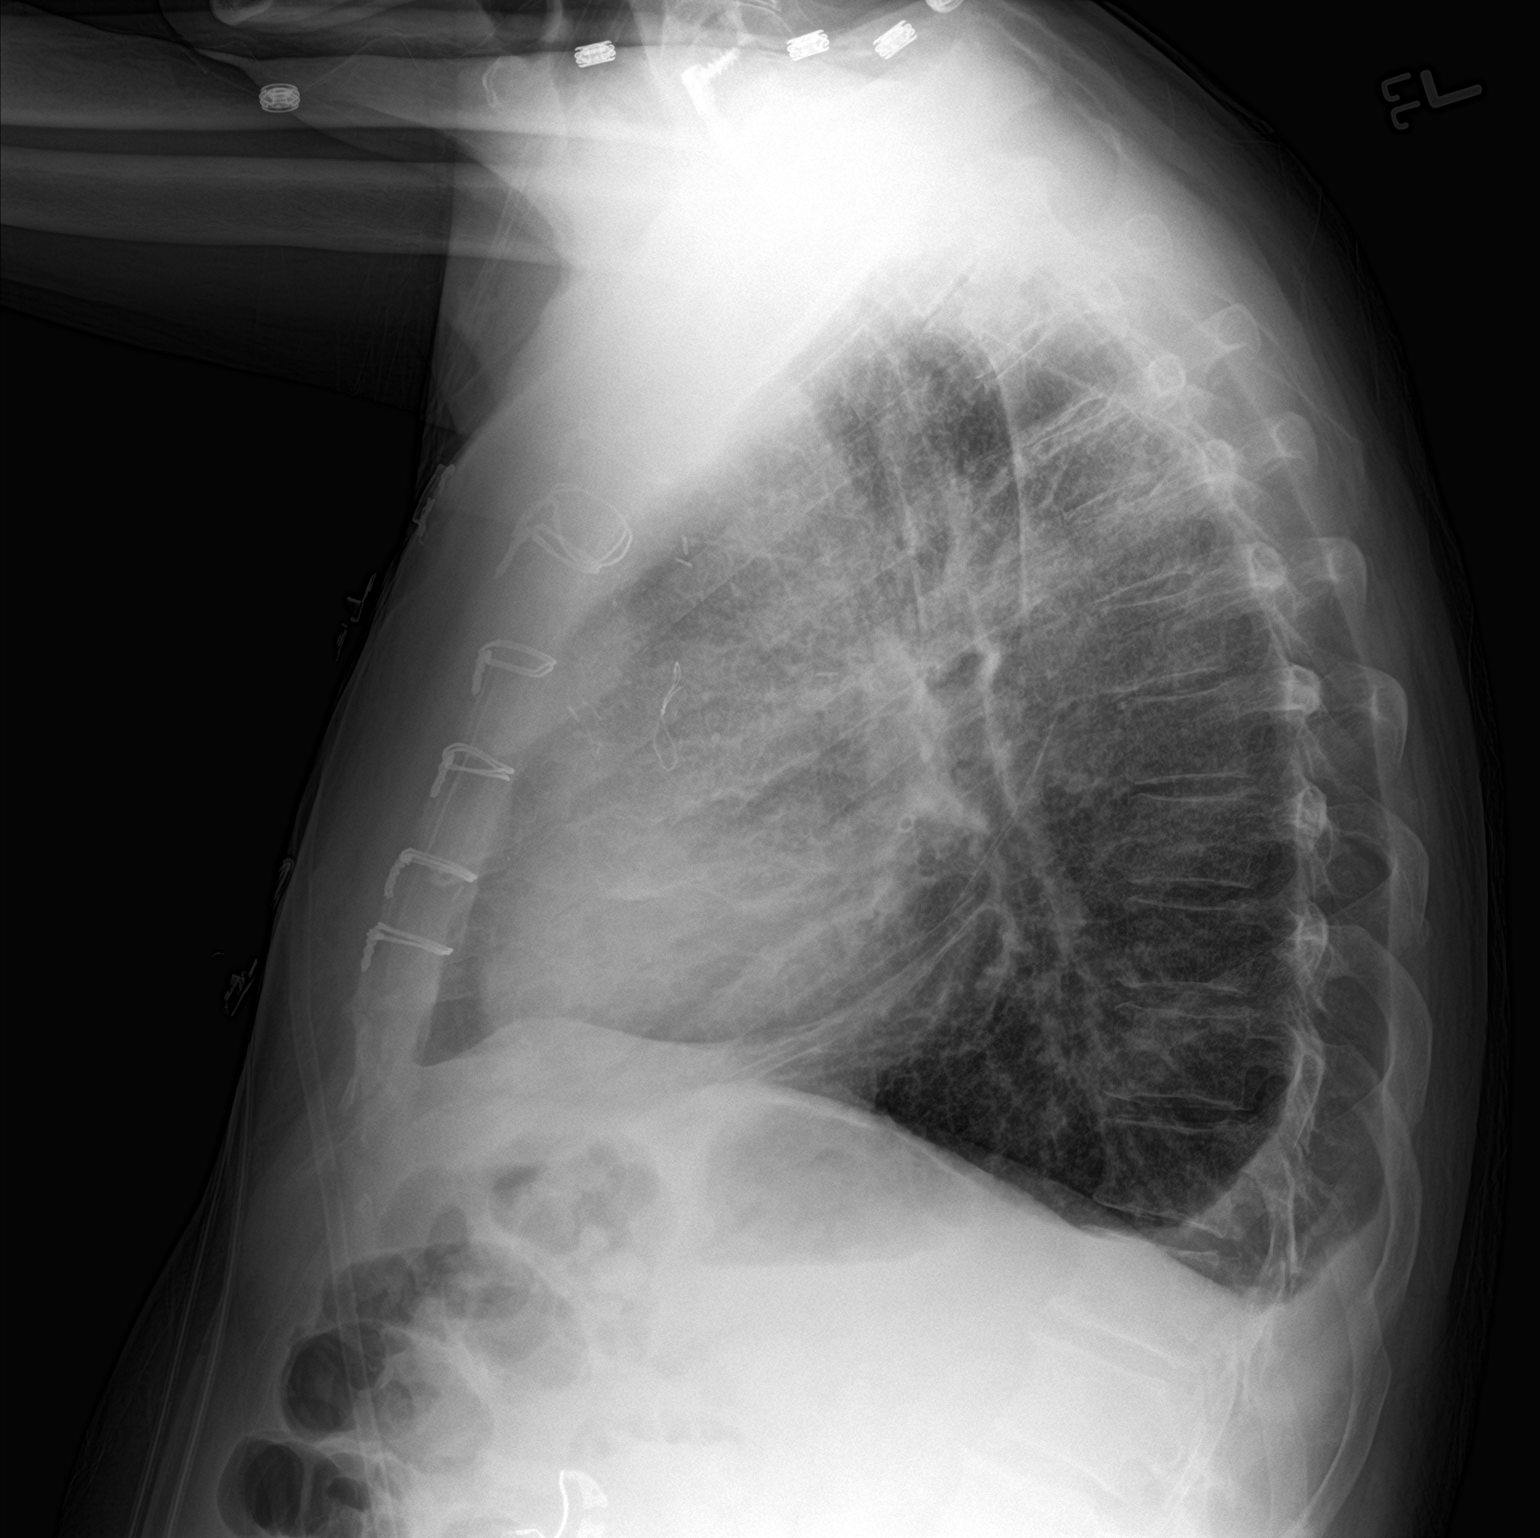

[2 of 2 positions shown; findings below may reference images not displayed]

FINDINGS: Prior CABG. Cardiomegaly again noted. Bilateral diffuse interstitial
prominence again noted with slight improvement from prior exam.
Findings suggest improving interstitial edema. Pneumonitis cannot be
excluded. Bibasilar subsegmental atelectasis. Small bilateral
pleural effusions. No pneumothorax. Prior cervical spine fusion.
IMPRESSION: Prior CABG. Cardiomegaly again noted. Diffuse bilateral interstitial
prominence again noted with slight improvement from prior exam.
Findings suggest slight improvement of interstitial edema. Mild
bibasilar atelectasis. Small bilateral pleural effusions noted.

## 2022-04-15 IMAGING — CR DG CHEST 2V
2 series · 2 of 2 positions shown · non-contrast
Comparison: 08/15/2019

CLINICAL DATA: History of prior coronary bypass grafting

EXAM:
CHEST - 2 VIEW

[w chest pa]
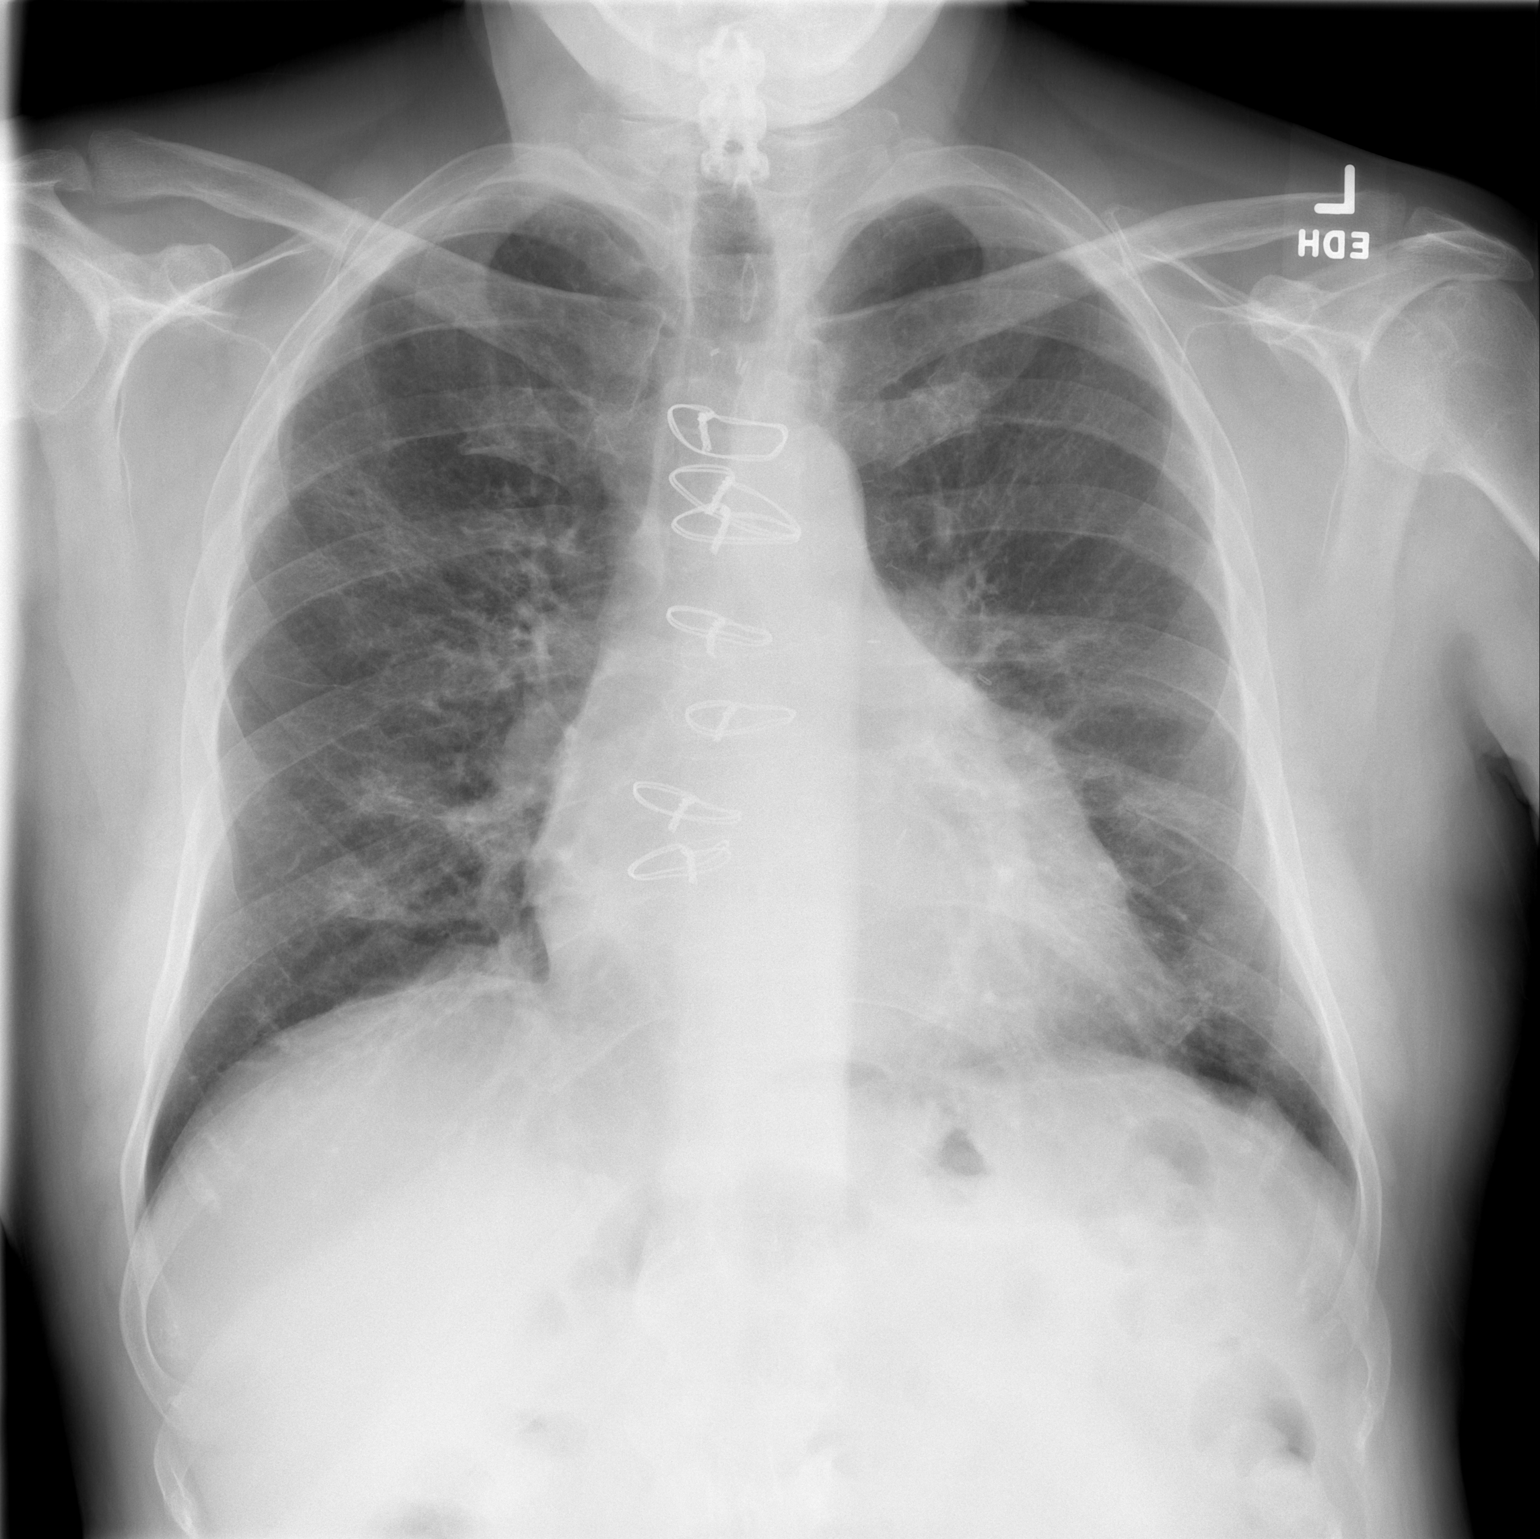

[w chest lat]
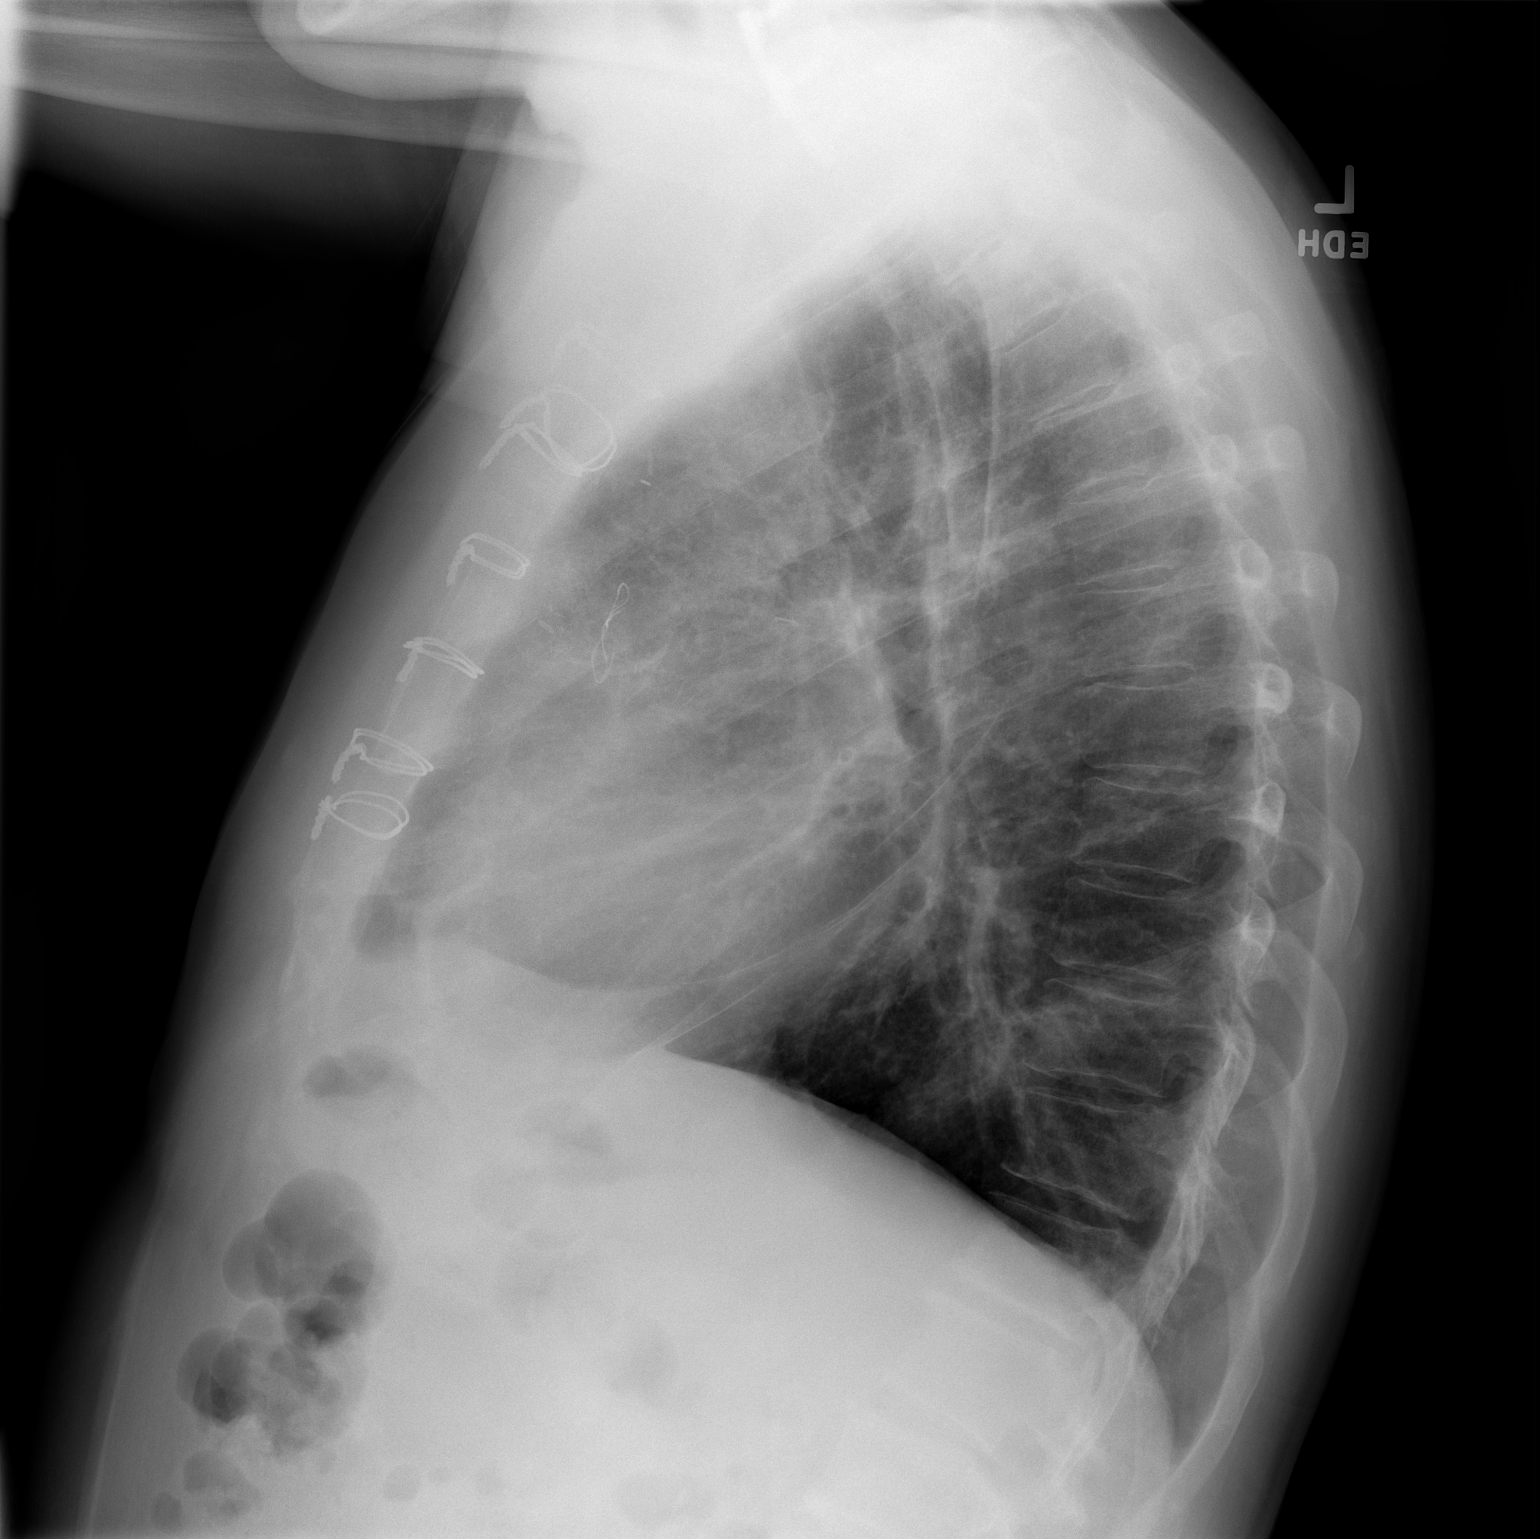

[2 of 2 positions shown; findings below may reference images not displayed]

FINDINGS: Cardiac shadow is stable. Postsurgical changes are again noted. The
lungs are well aerated bilaterally. Mild chronic interstitial
changes are seen likely related to scarring. No focal infiltrate or
sizable effusion is noted. No bony abnormality is seen.
IMPRESSION: Chronic changes without acute abnormality.
